# Patient Record
Sex: Female | Born: 1955 | Race: White | Hispanic: No | Marital: Married | Smoking: Never smoker
Health system: Southern US, Community
[De-identification: ages and names within clinical notes are randomized; demographics above are authoritative.]

## PROBLEM LIST (undated history)

## (undated) DIAGNOSIS — E785 Hyperlipidemia, unspecified: Secondary | ICD-10-CM

## (undated) DIAGNOSIS — M542 Cervicalgia: Secondary | ICD-10-CM

## (undated) DIAGNOSIS — Z1371 Encounter for nonprocreative screening for genetic disease carrier status: Secondary | ICD-10-CM

## (undated) DIAGNOSIS — G43909 Migraine, unspecified, not intractable, without status migrainosus: Secondary | ICD-10-CM

## (undated) DIAGNOSIS — R05 Cough: Secondary | ICD-10-CM

## (undated) DIAGNOSIS — E7800 Pure hypercholesterolemia, unspecified: Secondary | ICD-10-CM

## (undated) DIAGNOSIS — I10 Essential (primary) hypertension: Secondary | ICD-10-CM

## (undated) DIAGNOSIS — M549 Dorsalgia, unspecified: Secondary | ICD-10-CM

## (undated) DIAGNOSIS — K219 Gastro-esophageal reflux disease without esophagitis: Secondary | ICD-10-CM

## (undated) DIAGNOSIS — R053 Chronic cough: Secondary | ICD-10-CM

## (undated) HISTORY — DX: Gastro-esophageal reflux disease without esophagitis: K21.9

## (undated) HISTORY — DX: Migraine, unspecified, not intractable, without status migrainosus: G43.909

## (undated) HISTORY — DX: Cervicalgia: M54.2

## (undated) HISTORY — DX: Dorsalgia, unspecified: M54.9

## (undated) HISTORY — DX: Encounter for nonprocreative screening for genetic disease carrier status: Z13.71

## (undated) HISTORY — DX: Chronic cough: R05.3

## (undated) HISTORY — PX: APPENDECTOMY: SHX54

## (undated) HISTORY — DX: Essential (primary) hypertension: I10

## (undated) HISTORY — PX: ABDOMINAL HYSTERECTOMY: SHX81

## (undated) HISTORY — DX: Hyperlipidemia, unspecified: E78.5

## (undated) HISTORY — DX: Cough: R05

## (undated) HISTORY — DX: Pure hypercholesterolemia, unspecified: E78.00

---
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
## 2007-10-24 LAB — HM COLONOSCOPY: HM Colonoscopy: NORMAL

## 2008-08-04 ENCOUNTER — Emergency Department (HOSPITAL_BASED_OUTPATIENT_CLINIC_OR_DEPARTMENT_OTHER): Admission: EM | Admit: 2008-08-04 | Discharge: 2008-08-04 | Payer: Self-pay | Admitting: Emergency Medicine

## 2008-08-13 ENCOUNTER — Ambulatory Visit: Payer: Self-pay | Admitting: Family Medicine

## 2008-08-13 DIAGNOSIS — E785 Hyperlipidemia, unspecified: Secondary | ICD-10-CM

## 2008-08-13 DIAGNOSIS — F411 Generalized anxiety disorder: Secondary | ICD-10-CM | POA: Insufficient documentation

## 2008-08-13 DIAGNOSIS — G43909 Migraine, unspecified, not intractable, without status migrainosus: Secondary | ICD-10-CM | POA: Insufficient documentation

## 2008-08-13 DIAGNOSIS — R5383 Other fatigue: Secondary | ICD-10-CM

## 2008-08-13 DIAGNOSIS — R5381 Other malaise: Secondary | ICD-10-CM

## 2008-08-13 DIAGNOSIS — R030 Elevated blood-pressure reading, without diagnosis of hypertension: Secondary | ICD-10-CM | POA: Insufficient documentation

## 2008-08-13 DIAGNOSIS — M549 Dorsalgia, unspecified: Secondary | ICD-10-CM | POA: Insufficient documentation

## 2008-08-14 ENCOUNTER — Telehealth (INDEPENDENT_AMBULATORY_CARE_PROVIDER_SITE_OTHER): Payer: Self-pay | Admitting: *Deleted

## 2008-08-14 LAB — CONVERTED CEMR LAB
Alkaline Phosphatase: 57 units/L (ref 39–117)
Bilirubin, Direct: 0.1 mg/dL (ref 0.0–0.3)
CO2: 29 meq/L (ref 19–32)
Cholesterol: 281 mg/dL (ref 0–200)
GFR calc Af Amer: 98 mL/min
Glucose, Bld: 94 mg/dL (ref 70–99)
Lymphocytes Relative: 21.1 % (ref 12.0–46.0)
Monocytes Relative: 5.1 % (ref 3.0–12.0)
Neutrophils Relative %: 67.6 % (ref 43.0–77.0)
Platelets: 338 10*3/uL (ref 150–400)
Potassium: 4.8 meq/L (ref 3.5–5.1)
RDW: 12.1 % (ref 11.5–14.6)
Sodium: 142 meq/L (ref 135–145)
Total CHOL/HDL Ratio: 4.7
Total Protein: 6.9 g/dL (ref 6.0–8.3)
Triglycerides: 98 mg/dL (ref 0–149)
VLDL: 20 mg/dL (ref 0–40)

## 2008-09-11 ENCOUNTER — Ambulatory Visit: Payer: Self-pay | Admitting: Family Medicine

## 2008-09-12 ENCOUNTER — Encounter (INDEPENDENT_AMBULATORY_CARE_PROVIDER_SITE_OTHER): Payer: Self-pay | Admitting: *Deleted

## 2008-09-12 LAB — CONVERTED CEMR LAB
ALT: 27 units/L (ref 0–35)
AST: 23 units/L (ref 0–37)
Total Bilirubin: 0.8 mg/dL (ref 0.3–1.2)

## 2008-09-15 ENCOUNTER — Telehealth (INDEPENDENT_AMBULATORY_CARE_PROVIDER_SITE_OTHER): Payer: Self-pay | Admitting: *Deleted

## 2008-09-16 ENCOUNTER — Telehealth (INDEPENDENT_AMBULATORY_CARE_PROVIDER_SITE_OTHER): Payer: Self-pay | Admitting: *Deleted

## 2008-09-23 ENCOUNTER — Encounter: Admission: RE | Admit: 2008-09-23 | Discharge: 2008-09-23 | Payer: Self-pay | Admitting: Family Medicine

## 2008-10-03 ENCOUNTER — Encounter (INDEPENDENT_AMBULATORY_CARE_PROVIDER_SITE_OTHER): Payer: Self-pay | Admitting: *Deleted

## 2008-10-10 ENCOUNTER — Encounter (INDEPENDENT_AMBULATORY_CARE_PROVIDER_SITE_OTHER): Payer: Self-pay | Admitting: *Deleted

## 2009-03-11 ENCOUNTER — Ambulatory Visit: Payer: Self-pay | Admitting: Family Medicine

## 2009-03-11 DIAGNOSIS — H538 Other visual disturbances: Secondary | ICD-10-CM | POA: Insufficient documentation

## 2009-03-12 ENCOUNTER — Ambulatory Visit: Payer: Self-pay | Admitting: Family Medicine

## 2009-03-13 ENCOUNTER — Telehealth (INDEPENDENT_AMBULATORY_CARE_PROVIDER_SITE_OTHER): Payer: Self-pay | Admitting: *Deleted

## 2009-03-13 LAB — CONVERTED CEMR LAB
AST: 33 units/L (ref 0–37)
Albumin: 3.9 g/dL (ref 3.5–5.2)
Alkaline Phosphatase: 58 units/L (ref 39–117)
Cholesterol: 201 mg/dL — ABNORMAL HIGH (ref 0–200)
Total Bilirubin: 0.6 mg/dL (ref 0.3–1.2)
Total CHOL/HDL Ratio: 3

## 2009-03-31 ENCOUNTER — Ambulatory Visit: Payer: Self-pay | Admitting: Family Medicine

## 2009-09-09 ENCOUNTER — Telehealth (INDEPENDENT_AMBULATORY_CARE_PROVIDER_SITE_OTHER): Payer: Self-pay | Admitting: *Deleted

## 2009-10-14 ENCOUNTER — Encounter: Payer: Self-pay | Admitting: Family Medicine

## 2009-10-14 ENCOUNTER — Ambulatory Visit: Payer: Self-pay | Admitting: Family Medicine

## 2009-10-14 ENCOUNTER — Other Ambulatory Visit: Admission: RE | Admit: 2009-10-14 | Discharge: 2009-10-14 | Payer: Self-pay | Admitting: Family Medicine

## 2009-10-14 LAB — CONVERTED CEMR LAB
Bilirubin Urine: NEGATIVE
Blood in Urine, dipstick: NEGATIVE
Ketones, urine, test strip: NEGATIVE
Protein, U semiquant: NEGATIVE
Urobilinogen, UA: NEGATIVE

## 2009-10-15 ENCOUNTER — Encounter (INDEPENDENT_AMBULATORY_CARE_PROVIDER_SITE_OTHER): Payer: Self-pay | Admitting: *Deleted

## 2009-10-15 LAB — CONVERTED CEMR LAB
Alkaline Phosphatase: 58 units/L (ref 39–117)
Basophils Relative: 1.2 % (ref 0.0–3.0)
Bilirubin, Direct: 0.1 mg/dL (ref 0.0–0.3)
Calcium: 9.5 mg/dL (ref 8.4–10.5)
Creatinine, Ser: 0.9 mg/dL (ref 0.4–1.2)
Eosinophils Absolute: 0.3 10*3/uL (ref 0.0–0.7)
Eosinophils Relative: 6.4 % — ABNORMAL HIGH (ref 0.0–5.0)
HDL: 71.8 mg/dL (ref >39.00)
LDL Cholesterol: 102 mg/dL — ABNORMAL HIGH (ref 0–99)
Lymphocytes Relative: 23 % (ref 12.0–46.0)
Neutrophils Relative %: 63.7 % (ref 43.0–77.0)
Platelets: 275 10*3/uL (ref 150.0–400.0)
RBC: 4.55 M/uL (ref 3.87–5.11)
Total Bilirubin: 0.7 mg/dL (ref 0.3–1.2)
Total CHOL/HDL Ratio: 3
Total Protein: 6.8 g/dL (ref 6.0–8.3)
Triglycerides: 103 mg/dL (ref 0.0–149.0)
VLDL: 20.6 mg/dL (ref 0.0–40.0)
WBC: 5.3 10*3/uL (ref 4.5–10.5)

## 2009-10-19 ENCOUNTER — Encounter (INDEPENDENT_AMBULATORY_CARE_PROVIDER_SITE_OTHER): Payer: Self-pay | Admitting: *Deleted

## 2009-10-21 ENCOUNTER — Encounter: Admission: RE | Admit: 2009-10-21 | Discharge: 2009-10-21 | Payer: Self-pay | Admitting: Family Medicine

## 2009-11-23 ENCOUNTER — Ambulatory Visit: Payer: Self-pay | Admitting: Family Medicine

## 2010-04-03 ENCOUNTER — Emergency Department (HOSPITAL_BASED_OUTPATIENT_CLINIC_OR_DEPARTMENT_OTHER): Admission: EM | Admit: 2010-04-03 | Discharge: 2010-04-03 | Payer: Self-pay | Admitting: Emergency Medicine

## 2010-04-03 ENCOUNTER — Ambulatory Visit: Payer: Self-pay | Admitting: Radiology

## 2010-04-03 ENCOUNTER — Encounter: Payer: Self-pay | Admitting: Family Medicine

## 2010-07-22 ENCOUNTER — Encounter: Payer: Self-pay | Admitting: Family Medicine

## 2010-07-22 ENCOUNTER — Ambulatory Visit: Payer: Self-pay | Admitting: Family Medicine

## 2010-07-22 DIAGNOSIS — R51 Headache: Secondary | ICD-10-CM

## 2010-07-22 DIAGNOSIS — I10 Essential (primary) hypertension: Secondary | ICD-10-CM

## 2010-07-22 DIAGNOSIS — R519 Headache, unspecified: Secondary | ICD-10-CM | POA: Insufficient documentation

## 2010-07-22 DIAGNOSIS — R42 Dizziness and giddiness: Secondary | ICD-10-CM

## 2010-07-25 ENCOUNTER — Encounter: Admission: RE | Admit: 2010-07-25 | Discharge: 2010-07-25 | Payer: Self-pay | Admitting: Family Medicine

## 2010-07-27 ENCOUNTER — Telehealth (INDEPENDENT_AMBULATORY_CARE_PROVIDER_SITE_OTHER): Payer: Self-pay | Admitting: *Deleted

## 2010-07-27 LAB — CONVERTED CEMR LAB
ALT: 20 units/L (ref 0–35)
AST: 20 units/L (ref 0–37)
Alkaline Phosphatase: 56 units/L (ref 39–117)
BUN: 16 mg/dL (ref 6–23)
Basophils Absolute: 0.1 10*3/uL (ref 0.0–0.1)
Bilirubin, Direct: 0.1 mg/dL (ref 0.0–0.3)
Calcium: 10.1 mg/dL (ref 8.4–10.5)
Creatinine, Ser: 0.8 mg/dL (ref 0.4–1.2)
Eosinophils Relative: 6.2 % — ABNORMAL HIGH (ref 0.0–5.0)
Folate: 6.9 ng/mL
GFR calc non Af Amer: 78.85 mL/min (ref >60)
Glucose, Bld: 96 mg/dL (ref 70–99)
HCT: 41.7 % (ref 36.0–46.0)
HDL: 65.7 mg/dL (ref >39.00)
Lymphocytes Relative: 22.9 % (ref 12.0–46.0)
Monocytes Relative: 6 % (ref 3.0–12.0)
Neutrophils Relative %: 63.9 % (ref 43.0–77.0)
Platelets: 296 10*3/uL (ref 150.0–400.0)
Potassium: 5.1 meq/L (ref 3.5–5.1)
RDW: 13.1 % (ref 11.5–14.6)
TSH: 1.18 microintl units/mL (ref 0.35–5.50)
Total Bilirubin: 0.5 mg/dL (ref 0.3–1.2)
Triglycerides: 108 mg/dL (ref 0.0–149.0)
VLDL: 21.6 mg/dL (ref 0.0–40.0)
WBC: 5.1 10*3/uL (ref 4.5–10.5)

## 2010-08-02 ENCOUNTER — Ambulatory Visit: Payer: Self-pay | Admitting: Family Medicine

## 2010-08-02 DIAGNOSIS — J019 Acute sinusitis, unspecified: Secondary | ICD-10-CM

## 2010-08-12 ENCOUNTER — Encounter: Payer: Self-pay | Admitting: Family Medicine

## 2010-09-02 ENCOUNTER — Encounter: Payer: Self-pay | Admitting: Family Medicine

## 2010-09-29 ENCOUNTER — Ambulatory Visit: Payer: Self-pay | Admitting: Family Medicine

## 2010-10-04 LAB — CONVERTED CEMR LAB
ALT: 39 units/L — ABNORMAL HIGH (ref 0–35)
AST: 30 units/L (ref 0–37)
BUN: 19 mg/dL (ref 6–23)
Bilirubin, Direct: 0.1 mg/dL (ref 0.0–0.3)
CO2: 23 meq/L (ref 19–32)
Chloride: 104 meq/L (ref 96–112)
Creatinine, Ser: 1.01 mg/dL (ref 0.40–1.20)
Potassium: 4.6 meq/L (ref 3.5–5.3)
Total Protein: 6.1 g/dL (ref 6.0–8.3)

## 2010-10-15 ENCOUNTER — Ambulatory Visit: Payer: Self-pay | Admitting: Family Medicine

## 2010-11-23 ENCOUNTER — Other Ambulatory Visit: Payer: Self-pay | Admitting: Family Medicine

## 2010-11-23 ENCOUNTER — Ambulatory Visit
Admission: RE | Admit: 2010-11-23 | Discharge: 2010-11-23 | Payer: Self-pay | Source: Home / Self Care | Attending: Family Medicine | Admitting: Family Medicine

## 2010-11-24 ENCOUNTER — Encounter: Payer: Self-pay | Admitting: Family Medicine

## 2010-11-24 LAB — CBC WITH DIFFERENTIAL/PLATELET
Basophils Absolute: 0 10*3/uL (ref 0.0–0.1)
Basophils Relative: 0.5 % (ref 0.0–3.0)
Eosinophils Absolute: 0.6 10*3/uL (ref 0.0–0.7)
Eosinophils Relative: 7.5 % — ABNORMAL HIGH (ref 0.0–5.0)
HCT: 37.5 % (ref 36.0–46.0)
Hemoglobin: 12.9 g/dL (ref 12.0–15.0)
Lymphocytes Relative: 22.1 % (ref 12.0–46.0)
Lymphs Abs: 1.7 10*3/uL (ref 0.7–4.0)
MCHC: 34.3 g/dL (ref 30.0–36.0)
MCV: 93 fl (ref 78.0–100.0)
Monocytes Absolute: 0.3 10*3/uL (ref 0.1–1.0)
Monocytes Relative: 3.5 % (ref 3.0–12.0)
Neutro Abs: 5.1 10*3/uL (ref 1.4–7.7)
Neutrophils Relative %: 66.4 % (ref 43.0–77.0)
Platelets: 378 10*3/uL (ref 150.0–400.0)
RBC: 4.04 Mil/uL (ref 3.87–5.11)
RDW: 13 % (ref 11.5–14.6)
WBC: 7.7 10*3/uL (ref 4.5–10.5)

## 2010-11-24 LAB — TSH: TSH: 0.91 u[IU]/mL (ref 0.35–5.50)

## 2010-11-25 ENCOUNTER — Telehealth (INDEPENDENT_AMBULATORY_CARE_PROVIDER_SITE_OTHER): Payer: Self-pay | Admitting: *Deleted

## 2010-12-01 ENCOUNTER — Encounter: Payer: Self-pay | Admitting: Family Medicine

## 2010-12-07 NOTE — Letter (Signed)
Summary: [facility]  [facility]   Imported By: Lanelle Bal 09/09/2010 11:05:43  _____________________________________________________________________  External Attachment:    Type:   Image     Comment:   External Document

## 2010-12-07 NOTE — Progress Notes (Signed)
Summary: REFILL REQUEST  Phone Note Refill Request Call back at [phone] Message from:  Pharmacy on July 27, 2010 8:06 AM  Refills Requested: Medication #1:  CEFTIN 500 MG TABS 1 by mouth two times a day.   Dosage confirmed as above?Dosage Confirmed   Supply Requested: 1 month   Notes: CORRECT DOCTOR [facility] [address]  Next Appointment Scheduled: 08/02/10 Initial call taken by: Lavell Islam,  July 27, 2010 8:07 AM  Follow-up for Phone Call        rx resubmitted with physician name on rx...........Marland KitchenFelecia Deloach CMA  July 27, 2010 12:36 PM     Prescriptions: CEFTIN 500 MG TABS (CEFUROXIME AXETIL) 1 by mouth two times a day  #20 x 0   Entered by:   Jeremy Johann CMA   Authorized by:   Loreen Freud DO   Signed by:   Jeremy Johann CMA on 07/27/2010   Method used:   Re-Faxed to ...       [facility] [phone][facility] (retail)       [address]       [place], [place]  [address]       Ph: [phone]       Fax: [phone]   RxID:   [id number]

## 2010-12-07 NOTE — Assessment & Plan Note (Signed)
Summary: dizziness while laying on left side//lch   Vital Signs:  Patient profile:   55 year old female Height:      63 inches Weight:      167.8 pounds BMI:     29.83 Pulse rate:   68 / minute Pulse rhythm:   regular BP sitting:   130 / 90  (right arm)  Vitals Entered By: Almeta Monas CMA Duncan Dull) (July 22, 2010 8:07 AM) CC: c/o dizziness since getting back and neck cracked at the chiropractor x10 days ago   History of Present Illness: Pt here c/o about severe headache about 6 weeks ago --she went to ER and was given meds for 10 days but then she stopped.  She since c/o about pressure in back of head and neck.  About  10 days ago she started getting dizzy after having her neck adjusted by chiropractor---Dr Kristin Bruins on [address].  Now she feels like she gets dizzy everytime she lays on her left side.    Current Medications (verified): 1)  Sumatriptan Succinate 25 Mg Tabs (Sumatriptan Succinate) .Marland Kitchen.. 1 Tab As Needed For Migraine 2)  Simvastatin 40 Mg Tabs (Simvastatin) .... Take One Tablet Daily 3)  Lisinopril-Hydrochlorothiazide 20-12.5 Mg Tabs (Lisinopril-Hydrochlorothiazide) .Marland Kitchen.. 1 By Mouth Once Daily  Allergies (verified): No Known Drug Allergies  Past History:  Past medical, surgical, family and social histories (including risk factors) reviewed for relevance to current acute and chronic problems.  Past Medical History: Reviewed history from 08/13/2008 and no changes required. Elevated cholesterol Back/neck pain BRCA (-)  Past Surgical History: Reviewed history from 08/13/2008 and no changes required. Hysterectomy- due to hemmorhagic cyst, 12/05, STILL HAS OVARIES Appendectomy 1997 Tonsils/Adenoids 1966  Family History: Reviewed history from 08/13/2008 and no changes required. Extensive family hx of breast CA- mother, aunts, grandmother, greatgrandmother Mother- DM, died at 22 Father- Prostate CA, died at 5 1 sister- healthy  Social History: Reviewed history  from 10/14/2009 and no changes required. 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  Otoscopic examination reveals clear canals, tympanic membranes are intact bilaterally without bulging, retraction, inflammation or discharge. Hearing is grossly normal bilaterally. Nose:  External nasal examination shows no deformity or inflammation. Nasal mucosa are pink and moist without lesions or exudates. Neck:  No deformities, masses, or tenderness noted.no carotid bruits.   Lungs:  Normal respiratory effort, chest expands symmetrically. Lungs are clear to auscultation, no crackles or wheezes. Heart:  Normal rate and regular rhythm. S1 and S2 normal without gallop, murmur, click, rub or other extra sounds. Psych:  Cognition and judgment appear intact. Alert and cooperative with normal attention span and concentration. No apparent delusions, illusions, hallucinations   Impression & Recommendations:  Problem # 1:  HEADACHE (ICD-784.0)  Her updated medication list for this problem includes:    Sumatriptan Succinate 25 Mg Tabs (Sumatriptan succinate) .Marland Kitchen... 1 tab as needed for migraine  Orders: EKG w/ Interpretation (93000) Venipuncture (62694) TLB-B12 + Folate Pnl (85462_70350-K93/GHW) TLB-Lipid Panel (80061-LIPID) TLB-BMP (Basic Metabolic Panel-BMET) (80048-METABOL) TLB-CBC Platelet - w/Differential (85025-CBCD) TLB-Hepatic/Liver Function Pnl (80076-HEPATIC) TLB-TSH (Thyroid Stimulating Hormone) (29937-JIR) Radiology Referral (Radiology) Specimen Handling (67893)  Headache diary reviewed.  Problem # 2:  DIZZINESS (ICD-780.4)  Orders: EKG w/ Interpretation (93000) Venipuncture (  53664) TLB-B12 +  Folate Pnl (82746_82607-B12/FOL) TLB-Lipid Panel (80061-LIPID) TLB-BMP (Basic Metabolic Panel-BMET) (80048-METABOL) TLB-CBC Platelet - w/Differential (85025-CBCD) TLB-Hepatic/Liver Function Pnl (80076-HEPATIC) TLB-TSH (Thyroid Stimulating Hormone) (40347-QQV) Radiology Referral (Radiology) Specimen Handling (95638)  Demonstrated maneuvers to self-treat vertigo. Patient to call to be seen if no improvement in 10-14 days, sooner if worse.   Problem # 3:  UNSPECIFIED ESSENTIAL HYPERTENSION (ICD-401.9)  Her updated medication list for this problem includes:    Lisinopril-hydrochlorothiazide 20-12.5 Mg Tabs (Lisinopril-hydrochlorothiazide) .Marland Kitchen... 1 by mouth once daily  Orders: EKG w/ Interpretation (93000) Venipuncture (75643) TLB-B12 + Folate Pnl (32951_88416-S06/TKZ) TLB-Lipid Panel (80061-LIPID) TLB-BMP (Basic Metabolic Panel-BMET) (80048-METABOL) TLB-CBC Platelet - w/Differential (85025-CBCD) TLB-Hepatic/Liver Function Pnl (80076-HEPATIC) TLB-TSH (Thyroid Stimulating Hormone) (60109-NAT) Radiology Referral (Radiology) Specimen Handling (55732)  BP today: 130/90 Prior BP: 150/93 (10/14/2009)  Labs Reviewed: K+: 4.6 (10/14/2009) Creat: : 0.9 (10/14/2009)   Chol: 194 (10/14/2009)   HDL: 71.80 (10/14/2009)   LDL: 102 (10/14/2009)   TG: 103.0 (10/14/2009)  Complete Medication List: 1)  Sumatriptan Succinate 25 Mg Tabs (Sumatriptan succinate) .Marland Kitchen.. 1 tab as needed for migraine 2)  Simvastatin 40 Mg Tabs (Simvastatin) .... Take one tablet daily 3)  Lisinopril-hydrochlorothiazide 20-12.5 Mg Tabs (Lisinopril-hydrochlorothiazide) .Marland Kitchen.. 1 by mouth once daily  Patient Instructions: 1)  Please schedule a follow-up appointment in 2 weeks.  Prescriptions: SIMVASTATIN 40 MG TABS (SIMVASTATIN) take one tablet daily  #90 x 3   Entered and Authorized by:   Loreen Freud DO   Signed by:   Loreen Freud DO on 07/22/2010   Method used:   [facility] to        [facility] [phone]*  (retail)       [address]       [place], [place]  [address]       Ph: [phone]       Fax: [phone]   RxID:   [id number] LISINOPRIL-HYDROCHLOROTHIAZIDE 20-12.5 MG TABS (LISINOPRIL-HYDROCHLOROTHIAZIDE) 1 by mouth once daily  #30 x 2   Entered and Authorized by:   Loreen Freud DO   Signed by:   Loreen Freud DO on 07/22/2010   Method used:   Electronically to        [facility] [phone]* (retail)       [address]       [place], [place]  [address]       Ph: [phone]       Fax: [phone]   RxID:   [id number]    EKG  Procedure date:  07/22/2010  Findings:      Normal sinus rhythm with rate of:  64 bpm

## 2010-12-07 NOTE — Assessment & Plan Note (Signed)
Summary: rto 10 days/cbs   Vital Signs:  Patient profile:   55 year old female Weight:      166 pounds Temp:     98.8 degrees F oral Pulse rate:   68 / minute Pulse rhythm:   regular BP sitting:   110 / 76  (left arm) Cuff size:   regular  Vitals Entered By: Almeta Monas CMA Duncan Dull) (August 02, 2010 9:27 AM) CC: 2 week f/u still having neck and back pain   History of Present Illness: Pt here f/u bp and dizziness.  Dizziness subsided after being on abx a few days.  Pt still with muscle spasm and headache R trap region.    Current Medications (verified): 1)  Sumatriptan Succinate 25 Mg Tabs (Sumatriptan Succinate) .Marland Kitchen.. 1 Tab As Needed For Migraine 2)  Simvastatin 40 Mg Tabs (Simvastatin) .... Take One Tablet Daily 3)  Lisinopril-Hydrochlorothiazide 20-12.5 Mg Tabs (Lisinopril-Hydrochlorothiazide) .Marland Kitchen.. 1 By Mouth Once Daily 4)  Ceftin 500 Mg Tabs (Cefuroxime Axetil) .Marland Kitchen.. 1 By Mouth Two Times A Day 5)  Flexeril 10 Mg Tabs (Cyclobenzaprine Hcl) .Marland Kitchen.. 1 By Mouth Three Times A Day 6)  Aspir-Low 81 Mg Tbec (Aspirin) .Marland Kitchen.. 1po Once Daily  Allergies (verified): No Known Drug Allergies  Past History:  Past medical, surgical, family and social histories (including risk factors) reviewed for relevance to current acute and chronic problems.  Past Medical History: Reviewed history from 08/13/2008 and no changes required. Elevated cholesterol Back/neck pain BRCA (-)  Past Surgical History: Reviewed history from 08/13/2008 and no changes required. Hysterectomy- due to hemmorhagic cyst, 12/05, STILL HAS OVARIES Appendectomy 1997 Tonsils/Adenoids 1966  Family History: Reviewed history from 08/13/2008 and no changes required. Extensive family hx of breast CA- mother, aunts, grandmother, greatgrandmother Mother- DM, died at 22 Father- Prostate CA, died at 85 1 sister- healthy  Social History: Reviewed history from 10/14/2009 and no changes required. 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 Msk:  + muscle spasms R trap Neurologic:  No cranial nerve deficits noted. Station and gait are normal. Plantar reflexes are down-going bilaterally. DTRs are symmetrical throughout. Sensory, motor and coordinative functions appear intact. Skin:  Intact without suspicious lesions or rashes Cervical Nodes:  No lymphadenopathy noted Psych:  Cognition and judgment appear intact. Alert and cooperative with normal attention span and concentration. No apparent delusions, illusions, hallucinations   Impression & Recommendations:  Problem # 1:  HEADACHE (ICD-784.0) Tension/ muscle spasm----flexeril 10 three times a day as needed  Her updated medication list for this problem includes:    Sumatriptan Succinate 25 Mg Tabs (Sumatriptan succinate) .Marland Kitchen... 1 tab as needed for migraine    Aspir-low 81 Mg Tbec (Aspirin) .Marland Kitchen... 1po once daily  Headache diary reviewed.  Problem # 2:  DIZZINESS (ICD-780.4) Assessment: Improved  Problem # 3:  HYPERLIPIDEMIA (ICD-272.4)  Her updated medication list for this problem includes:    Simvastatin 40 Mg Tabs (Simvastatin) .Marland Kitchen... Take one tablet daily  Labs Reviewed: SGOT: 20 (07/22/2010)   SGPT: 20 (07/22/2010)   HDL:65.70 (07/22/2010), 71.80 (10/14/2009)  LDL:102 (10/14/2009), DEL (08/13/2008)  Chol:229 (07/22/2010), 194 (10/14/2009)  Trig:108.0 (07/22/2010), 103.0 (10/14/2009)  Problem # 4:  SINUSITIS - ACUTE-NOS (ICD-461.9) Assessment: Improved  Her updated medication list for this  problem includes:    Ceftin 500 Mg Tabs (Cefuroxime axetil) .Marland Kitchen... 1 by mouth two times a day  Instructed on treatment. Call if symptoms persist or worsen.   Complete Medication  List: 1)  Sumatriptan Succinate 25 Mg Tabs (Sumatriptan succinate) .Marland Kitchen.. 1 tab as needed for migraine 2)  Simvastatin 40 Mg Tabs (Simvastatin) .... Take one tablet daily 3)  Lisinopril-hydrochlorothiazide 20-12.5 Mg Tabs (Lisinopril-hydrochlorothiazide) .Marland Kitchen.. 1 by mouth once daily 4)  Ceftin 500 Mg Tabs (Cefuroxime axetil) .Marland Kitchen.. 1 by mouth two times a day 5)  Flexeril 10 Mg Tabs (Cyclobenzaprine hcl) .Marland Kitchen.. 1 by mouth three times a day 6)  Aspir-low 81 Mg Tbec (Aspirin) .Marland Kitchen.. 1po once daily  Patient Instructions: 1)  NMR, hep , bmp---272.4 ----2 months with ov 2 weeks after --- make that her cpe  Prescriptions: FLEXERIL 10 MG TABS (CYCLOBENZAPRINE HCL) 1 by mouth three times a day  #30 x 0   Entered and Authorized by:   Loreen Freud DO   Signed by:   Loreen Freud DO on 08/02/2010   Method used:   Electronically to        [facility] [phone]* (retail)       [address]       [place], [place]  [address]       Ph: [phone]       Fax: [phone]   RxID:   [id number]     Appended Document: Orders Update Flu Vaccine Consent Questions     Do you have a history of severe allergic reactions to this vaccine? no    Any prior history of allergic reactions to egg and/or gelatin? no    Do you have a sensitivity to the preservative Thimersol? no    Do you have a past history of Guillan-Barre Syndrome? no    Do you currently have an acute febrile illness? no    Have you ever had a severe reaction to latex? no    Vaccine information given and explained to patient? yes    Are you currently pregnant? no    Lot Number:[id number]   Exp Date:05/07/2011   Site Given  Left Deltoid IM    Clinical Lists Changes  Orders: Added new Service order of Admin 1st Vaccine (83419) - Signed Added new Service order of Flu Vaccine 11yrs + [phone]) - Signed Observations: Added new observation of FLU VAX VIS: 06/01/10 version (08/02/2010 10:00) Added new observation of FLU  VAXLOT: [id number] (08/02/2010 10:00) Added new observation of FLU VAXMFR: Glaxosmithkline (08/02/2010 10:00) Added new observation of FLU VAX EXP: 05/07/2011 (08/02/2010 10:00) Added new observation of FLU VAX DSE: 0.10ml (08/02/2010 10:00) Added new observation of FLU VAX: Fluvax 3+ (08/02/2010 10:00)

## 2010-12-07 NOTE — Consult Note (Signed)
Summary: [facility]  [facility]   Imported By: Lanelle Bal 08/27/2010 08:27:36  _____________________________________________________________________  External Attachment:    Type:   Image     Comment:   External Document

## 2010-12-08 ENCOUNTER — Encounter: Payer: Self-pay | Admitting: Family Medicine

## 2010-12-09 NOTE — Letter (Signed)
Summary: Generic Letter  [facility]  [address] [place], [place] [address]   Phone: [phone]  Fax: [phone]    12/01/2010     [name] [address] [place], [place]  [address]     Dear [name],      I have tried to contact you by phone in reference to our last conversation. Please feel free to give me a call [phone]. I will be available Monday- Friday from 8AM until 5PM.        Sincerely,   Almeta Monas CMA (AAMA)

## 2010-12-09 NOTE — Progress Notes (Addendum)
Summary: Results 1/19,1/20,1/23  Phone Note Outgoing Call   Call placed by: Almeta Monas CMA Duncan Dull),  November 25, 2010 4:45 PM Call placed to: Patient Details for Reason: eosinophils elevated---any allergy symptoms?   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 Wwanted to know if we could call something in for [facility] cough [facility] [address].... c/b # [id number].Marland KitchenMarland Kitchenplease advise  Follow-up for Phone Call        I would have her switch to allegra or zyrtec otc --- if she has dry cough that may help and she can use delsym . If its a productive cough she should use mucinex. Follow-up by: Loreen Freud DO,  November 25, 2010 4:51 PM  Additional Follow-up for Phone Call Additional follow up Details #1::        mssg left AM .... Almeta Monas CMA Duncan Dull)  November 26, 2010 10:19 AM  Left message to call back... Almeta Monas CMA Duncan Dull)  November 29, 2010 10:50 AM     Additional Follow-up for Phone Call Additional follow up Details #2::    unable to reach pt...letter mailed Follow-up by: Almeta Monas CMA Duncan Dull),  December 01, 2010 12:53 PM   Appended Document: Results 1/19,1/20,1/23 Discuss with patient

## 2010-12-09 NOTE — Assessment & Plan Note (Signed)
Summary: cpx/cbs   Vital Signs:  Patient profile:   55 year old female Height:      63.5 inches O2 Sat:      98 % on Room air Temp:     98.5 degrees F oral Pulse rate:   114 / minute Resp:     16 per minute BP sitting:   86 / 56  (left arm)  Vitals Entered By: Jeremy Johann CMA (November 23, 2010 1:07 PM)  O2 Flow:  Room air CC: CPX, not fasting, no pap Comments Pt decline to weight   History of Present Illness: Pt here for cpe--no pap.     Hyperlipidemia follow-up      This is a 55 year old woman who presents for Hyperlipidemia follow-up.  The patient denies muscle aches, GI upset, abdominal pain, flushing, itching, constipation, diarrhea, and fatigue.  The patient denies the following symptoms: chest pain/pressure, exercise intolerance, dypsnea, palpitations, syncope, and pedal edema.  Compliance with medications (by patient report) has been near 100%.  Dietary compliance has been good.  The patient reports exercising 3-4X per week.  Adjunctive measures currently used by the patient include ASA.    Hypertension follow-up      The patient also presents for Hypertension follow-up.  The patient denies lightheadedness, urinary frequency, headaches, edema, impotence, rash, and fatigue.  The patient denies the following associated symptoms: chest pain, chest pressure, exercise intolerance, dyspnea, palpitations, syncope, leg edema, and pedal edema.  Compliance with medications (by patient report) has been near 100%.  The patient reports that dietary compliance has been good.  The patient reports exercising 3-4X per week.  Adjunctive measures currently used by the patient include salt restriction.    Preventive Screening-Counseling & Management  Alcohol-Tobacco     Alcohol drinks/day: <1     Alcohol type: wine     Smoking Status: never  Caffeine-Diet-Exercise     Caffeine use/day: 1     Does Patient Exercise: yes     Type of exercise: walking     Exercise (avg: min/session):  30-60     Times/week: <3  Hep-HIV-STD-Contraception     Dental Visit-last 6 months yes     Dental Care Counseling: not indicated; dental care within six months     SBE monthly: yes     SBE Education/Counseling: not indicated; SBE done regularly      Sexual History:  currently monogamous and married with 3 children.        Drug Use:  never.    Current Medications (verified): 1)  Simvastatin 40 Mg Tabs (Simvastatin) .... Take One Tablet Daily 2)  Lisinopril-Hydrochlorothiazide 10-12.5 Mg Tabs (Lisinopril-Hydrochlorothiazide) .Marland Kitchen.. 1 By Mouth Once Daily 3)  Aspir-Low 81 Mg Tbec (Aspirin) .Marland Kitchen.. 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 Extensive family hx of breast CA- mother, aunts, grandmother, greatgrandmother Mother- DM, died at 51 Father- Prostate CA, died at 57 1 sister- healthy  Social History: Reviewed history from 10/14/2009 and no changes required. 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 Eyes:  Denies blurring, discharge, double vision, eye irritation, eye pain, halos, itching, light sensitivity, red eye, vision loss-1 eye, and vision loss-both eyes; optho--- Z6X. ENT:  Denies decreased hearing, difficulty swallowing, ear discharge, earache, hoarseness, nasal congestion, nosebleeds, postnasal drainage, ringing in ears, sinus pressure, and sore throat. CV:  Denies bluish discoloration of lips or nails, chest pain or discomfort, difficulty breathing at night, difficulty breathing while lying down, fainting, fatigue, leg cramps with exertion, lightheadness, near fainting, palpitations, shortness of breath with exertion, swelling of feet, swelling of hands, and weight gain. Resp:  Denies chest discomfort, chest pain with inspiration, cough, coughing up blood, excessive snoring, hypersomnolence, morning headaches, pleuritic, shortness of breath, sputum productive, and wheezing. GI:  Denies abdominal pain, bloody stools, change in bowel habits, constipation, dark tarry stools, diarrhea, excessive appetite, gas, hemorrhoids, indigestion, loss of appetite, nausea, vomiting, vomiting blood, and yellowish skin color. GU:  Denies abnormal vaginal bleeding, decreased libido, discharge, dysuria, genital sores, hematuria, incontinence, nocturia, urinary frequency, and urinary hesitancy. MS:  Denies joint pain, joint redness, joint swelling, loss of strength, low back pain, mid back pain, muscle aches, muscle , cramps, muscle weakness, stiffness, and thoracic pain. Derm:  Denies changes in color of skin, changes in nail beds, dryness, excessive perspiration, flushing, hair loss, insect bite(s), itching, lesion(s), poor wound healing, and rash. Neuro:  Denies brief paralysis, difficulty with concentration, disturbances in coordination, falling down, headaches, inability to speak, memory loss, numbness, poor  balance, seizures, sensation of room spinning, tingling, tremors, visual disturbances, and weakness. Psych:  Denies alternate hallucination ( auditory/visual), anxiety, depression, easily angered, easily tearful, irritability, mental problems, panic attacks, sense of great danger, suicidal thoughts/plans, thoughts of violence, unusual visions or sounds, and thoughts /plans of harming others. Endo:  Denies cold intolerance, excessive hunger, excessive thirst, excessive urination, heat intolerance, polyuria, and weight change. Heme:  Denies abnormal bruising, bleeding, enlarge lymph nodes, fevers, pallor, and skin discoloration. Allergy:  Denies hives or rash, itching eyes, persistent infections, seasonal allergies, and sneezing.  Physical Exam  General:  Well-developed,well-nourished,in no acute distress; alert,appropriate and cooperative throughout examination Head:  Normocephalic and atraumatic without obvious abnormalities. No apparent alopecia or balding. Eyes:  vision grossly intact, pupils equal, pupils round, pupils reactive to light, and no injection.   Ears:  External ear exam shows no significant lesions or deformities.  Otoscopic examination reveals clear canals, tympanic membranes are intact bilaterally without bulging, retraction, inflammation or discharge. Hearing is grossly normal bilaterally. Nose:  External nasal examination shows no deformity or inflammation. Nasal mucosa are pink and moist without lesions or exudates. Mouth:  Oral mucosa and oropharynx without lesions or exudates.  Teeth in good repair. Neck:  No deformities, masses, or tenderness noted. Chest Wall:  No deformities, masses, or tenderness noted. Breasts:  No mass, nodules, thickening, tenderness, bulging, retraction, inflamation, nipple discharge or skin changes noted.   Lungs:  Normal respiratory effort, chest expands symmetrically. Lungs are clear to auscultation, no crackles or wheezes. Heart:  normal rate and  no murmur.   Abdomen:  Bowel sounds positive,abdomen soft and non-tender without masses, organomegaly or hernias noted. Msk:  normal ROM, no joint tenderness, no joint swelling, no joint warmth, no redness over joints, no joint deformities, no joint instability, and no crepitation.   Pulses:  R and L carotid,radial,femoral,dorsalis pedis and posterior tibial pulses are full and equal bilaterally Extremities:  No clubbing, cyanosis, edema, or deformity noted with normal full range of motion of all joints.   Neurologic:  No cranial nerve deficits noted. Station and gait are normal. Plantar reflexes are down-going bilaterally. DTRs are symmetrical throughout. Sensory, motor and coordinative functions appear intact. Skin:  Intact without suspicious lesions or rashes Cervical Nodes:  No lymphadenopathy noted Axillary Nodes:  No palpable lymphadenopathy Psych:  Cognition and judgment appear intact. Alert and cooperative with normal attention span and concentration. No apparent delusions, illusions, hallucinations   Impression & Recommendations:  Problem # 1:  PREVENTIVE HEALTH CARE (ICD-V70.0) cholesterol, bmp, hep Orders: Venipuncture (29562) TLB-CBC Platelet - w/Differential (85025-CBCD) TLB-TSH (Thyroid Stimulating Hormone) (13086-VHQ) Radiology Referral (Radiology)  Problem # 2:  UNSPECIFIED ESSENTIAL HYPERTENSION (ICD-401.9) Assessment: Improved  Her updated medication list for this problem includes:    Lisinopril-hydrochlorothiazide 10-12.5 Mg Tabs (Lisinopril-hydrochlorothiazide) .Marland Kitchen... 1 by mouth once daily  BP today: 86/56 Prior BP: 110/76 (08/02/2010)  Labs Reviewed: K+: 4.6 (09/29/2010) Creat: : 1.01 (09/29/2010)   Chol: 229 (07/22/2010)   HDL: 65.70 (07/22/2010)   LDL: 102 (10/14/2009)   TG: 108.0 (07/22/2010)  Problem # 3:  HYPERLIPIDEMIA (ICD-272.4)  Her updated medication list for this problem includes:    Simvastatin 40 Mg Tabs (Simvastatin) .Marland Kitchen... Take one tablet  daily  Labs Reviewed: SGOT: 30 (09/29/2010)   SGPT: 39 (09/29/2010)   HDL:65.70 (07/22/2010), 71.80 (10/14/2009)  LDL:102 (10/14/2009), DEL (08/13/2008)  Chol:229 (07/22/2010), 194 (10/14/2009)  Trig:108.0 (07/22/2010), 103.0 (10/14/2009)  Complete Medication List: 1)  Simvastatin 40 Mg Tabs (Simvastatin) .... Take one tablet daily 2)  Lisinopril-hydrochlorothiazide 10-12.5 Mg Tabs (Lisinopril-hydrochlorothiazide) .Marland Kitchen.. 1 by mouth once daily 3)  Aspir-low 81 Mg Tbec (Aspirin) .Marland Kitchen.. 1po once daily 4)  Calcium 600 1500 Mg Tabs (Calcium carbonate) .Marland Kitchen.. 1 by mouth two times a day 5)  Vitamin D 1000 Unit Tabs (Cholecalciferol) .Marland Kitchen.. 1 by mouth once daily  Patient Instructions: 1)  Please schedule a follow-up appointment in 6 months .  Prescriptions: LISINOPRIL-HYDROCHLOROTHIAZIDE 10-12.5 MG TABS (LISINOPRIL-HYDROCHLOROTHIAZIDE) 1 by mouth once daily  #90 x 3   Entered and Authorized by:   Loreen Freud DO   Signed by:   Loreen Freud DO on 11/23/2010   Method used:   Electronically to        [facility] [phone]* (retail)       [address]       [place], [place]  [address]       Ph: [phone]       Fax: [phone]   RxID:   [id number]    Orders Added: 1)  Venipuncture [29518] 2)  TLB-CBC Platelet - w/Differential [85025-CBCD] 3)  TLB-TSH (Thyroid Stimulating Hormone) [84443-TSH] 4)  Radiology Referral [Radiology] 5)  Est. Patient 40-64 years [99396]      Appended Document: cpx/cbs   Appended Document: cpx/cbs  Laboratory Results   Urine Tests   Date/Time Reported: November 23, 2010 1:52 PM   Routine Urinalysis   Color: lt. yellow Appearance: Cloudy Glucose: negative   (Normal Range: Negative) Bilirubin: negative   (Normal Range: Negative)  Ketone: negative   (Normal Range: Negative) Spec. Gravity: <1.005   (Normal Range: 1.003-1.035) Blood: trace-lysed   (Normal Range: Negative) pH: 5.0   (Normal Range: 5.0-8.0) Protein:  negative   (Normal Range: Negative) Urobilinogen: negative   (Normal Range: 0-1) Nitrite: negative   (Normal Range: Negative) Leukocyte Esterace: negative   (Normal Range: Negative)    Comments: Floydene Flock  November 23, 2010 1:52 PM cx sent/recheck 2 weeks

## 2011-08-04 ENCOUNTER — Other Ambulatory Visit: Payer: Self-pay | Admitting: Family Medicine

## 2011-08-11 ENCOUNTER — Encounter: Payer: Self-pay | Admitting: Family Medicine

## 2011-08-11 ENCOUNTER — Ambulatory Visit: Payer: Self-pay | Admitting: Family Medicine

## 2011-08-15 ENCOUNTER — Telehealth: Payer: Self-pay

## 2011-08-15 NOTE — Telephone Encounter (Signed)
She can schedule her own if that is easier for her--- just give her phone number.

## 2011-08-15 NOTE — Telephone Encounter (Signed)
msg from patient and she stated she missed her first mammogram and wanted to get an order and apt for another one .Please advise    KP

## 2011-08-16 ENCOUNTER — Other Ambulatory Visit: Payer: Self-pay | Admitting: Family Medicine

## 2011-08-16 DIAGNOSIS — Z1231 Encounter for screening mammogram for malignant neoplasm of breast: Secondary | ICD-10-CM

## 2011-08-16 NOTE — Telephone Encounter (Signed)
Previous mammogram says BCG and I gave patient the [phone] number to call and schedule    KP

## 2011-09-15 ENCOUNTER — Ambulatory Visit
Admission: RE | Admit: 2011-09-15 | Discharge: 2011-09-15 | Disposition: A | Discharge status: Home / Self Care | Payer: BC Managed Care – PPO | Source: Ambulatory Visit | Attending: Family Medicine | Admitting: Family Medicine

## 2011-09-15 ENCOUNTER — Other Ambulatory Visit: Payer: Self-pay | Admitting: Family Medicine

## 2011-09-15 DIAGNOSIS — Z1231 Encounter for screening mammogram for malignant neoplasm of breast: Secondary | ICD-10-CM

## 2011-12-05 ENCOUNTER — Other Ambulatory Visit: Payer: Self-pay | Admitting: Family Medicine

## 2011-12-05 MED ORDER — SIMVASTATIN 40 MG PO TABS: 40.0000 mg | ORAL_TABLET | Freq: Every day | ORAL | Status: DC

## 2011-12-05 NOTE — Telephone Encounter (Signed)
Rx faxed.    KP 

## 2011-12-05 NOTE — Telephone Encounter (Signed)
Scheduled KP

## 2011-12-14 ENCOUNTER — Encounter: Payer: BC Managed Care – PPO | Admitting: Family Medicine

## 2012-01-26 ENCOUNTER — Other Ambulatory Visit: Payer: Self-pay | Admitting: Family Medicine

## 2012-02-27 ENCOUNTER — Other Ambulatory Visit: Payer: Self-pay | Admitting: Family Medicine

## 2012-02-27 NOTE — Telephone Encounter (Signed)
Apt pending.      KP 

## 2012-03-26 ENCOUNTER — Other Ambulatory Visit: Payer: Self-pay | Admitting: Family Medicine

## 2012-04-23 ENCOUNTER — Encounter: Payer: Self-pay | Admitting: Family Medicine

## 2012-04-23 ENCOUNTER — Ambulatory Visit (INDEPENDENT_AMBULATORY_CARE_PROVIDER_SITE_OTHER): Payer: BC Managed Care – PPO | Admitting: Family Medicine

## 2012-04-23 ENCOUNTER — Other Ambulatory Visit (HOSPITAL_COMMUNITY)
Admission: RE | Admit: 2012-04-23 | Discharge: 2012-04-23 | Disposition: A | Discharge status: Home / Self Care | Payer: BC Managed Care – PPO | Source: Ambulatory Visit | Attending: Family Medicine | Admitting: Family Medicine

## 2012-04-23 VITALS — BP 112/70 | HR 69 | Temp 98.5°F | Ht 62.25 in | Wt 175.6 lb

## 2012-04-23 DIAGNOSIS — I10 Essential (primary) hypertension: Secondary | ICD-10-CM

## 2012-04-23 DIAGNOSIS — E785 Hyperlipidemia, unspecified: Secondary | ICD-10-CM

## 2012-04-23 DIAGNOSIS — Z124 Encounter for screening for malignant neoplasm of cervix: Secondary | ICD-10-CM

## 2012-04-23 DIAGNOSIS — Z01419 Encounter for gynecological examination (general) (routine) without abnormal findings: Secondary | ICD-10-CM | POA: Insufficient documentation

## 2012-04-23 DIAGNOSIS — Z0000 Encounter for general adult medical examination without abnormal findings: Secondary | ICD-10-CM

## 2012-04-23 MED ORDER — LISINOPRIL-HYDROCHLOROTHIAZIDE 10-12.5 MG PO TABS: 1.0000 | ORAL_TABLET | Freq: Every day | ORAL | Status: DC

## 2012-04-23 MED ORDER — SIMVASTATIN 40 MG PO TABS: ORAL_TABLET | ORAL | Status: DC

## 2012-04-23 NOTE — Assessment & Plan Note (Signed)
Stable con't meds 

## 2012-04-23 NOTE — Progress Notes (Signed)
Subjective:     Kristen Underwood is a 56 y.o. female and is here for a comprehensive physical exam. The patient reports no problems.  History   Social History  . Marital Status: Married    Spouse Name: N/A    Number of Children: 3  . Years of Education: N/A   Occupational History  . PSYCHOLOGIST    Social History Main Topics  . Smoking status: Never Smoker   . Smokeless tobacco: Not on file  . Alcohol Use: Yes     RARE,SOCIAL  . Drug Use: No  . Sexually Active: Yes -- Female partner(s)   Other Topics Concern  . Not on file   Social History Narrative   Exercise--not lately   Health Maintenance  Topic Date Due  . Pap Smear  04/29/1976  . Influenza Vaccine  08/07/2012  . Mammogram  09/14/2013  . Tetanus/tdap  10/20/2014  . Colonoscopy  10/23/2017    The following portions of the patient's history were reviewed and updated as appropriate: allergies, current medications, past family history, past medical history, past social history, past surgical history and problem list.  Review of Systems Review of Systems  Constitutional: Negative for activity change, appetite change and fatigue.  HENT: Negative for hearing loss, congestion, tinnitus and ear discharge.  dentist q3m Eyes: Negative for visual disturbance (see optho q1y -- vision corrected to 20/20 with glasses).  Respiratory: Negative for cough, chest tightness and shortness of breath.   Cardiovascular: Negative for chest pain, palpitations and leg swelling.  Gastrointestinal: Negative for abdominal pain, diarrhea, constipation and abdominal distention.  Genitourinary: Negative for urgency, frequency, decreased urine volume and difficulty urinating.  Musculoskeletal: Negative for back pain, arthralgias and gait problem.  Skin: Negative for color change, pallor and rash.  Neurological: Negative for dizziness, light-headedness, numbness and headaches.  Hematological: Negative for adenopathy. Does not bruise/bleed easily.    Psychiatric/Behavioral: Negative for suicidal ideas, confusion, sleep disturbance, self-injury, dysphoric mood, decreased concentration and agitation.       Objective:    BP 112/70  Pulse 69  Temp 98.5 F (36.9 C) (Oral)  Ht 5' 2.25" (1.581 m)  Wt 175 lb 9.6 oz (79.652 kg)  BMI 31.86 kg/m2  SpO2 97% General appearance: alert, cooperative, appears stated age and no distress Head: Normocephalic, without obvious abnormality, atraumatic Eyes: conjunctivae/corneas clear. PERRL, EOM's intact. Fundi benign. Ears: normal TM's and external ear canals both ears Nose: Nares normal. Septum midline. Mucosa normal. No drainage or sinus tenderness. Throat: lips, mucosa, and tongue normal; teeth and gums normal Neck: no adenopathy, no carotid bruit, no JVD, supple, symmetrical, trachea midline and thyroid not enlarged, symmetric, no tenderness/mass/nodules Back: symmetric, no curvature. ROM normal. No CVA tenderness. Lungs: clear to auscultation bilaterally Breasts: normal appearance, no masses or tenderness Heart: regular rate and rhythm, S1, S2 normal, no murmur, click, rub or gallop Abdomen: soft, non-tender; bowel sounds normal; no masses,  no organomegaly Pelvic: external genitalia normal, no adnexal masses or tenderness, uterus surgically absent and vagina normal without discharge Extremities: extremities normal, atraumatic, no cyanosis or edema Pulses: 2+ and symmetric Skin: Skin color, texture, turgor normal. No rashes or lesions Lymph nodes: Cervical, supraclavicular, and axillary nodes normal. Neurologic: Alert and oriented X 3, normal strength and tone. Normal symmetric reflexes. Normal coordination and gait psych-- no anxiety, no depression    Assessment:    Healthy female exam.      Plan:  Check labs Ghm utd   See After Visit Summary for  Counseling Recommendations

## 2012-04-23 NOTE — Patient Instructions (Addendum)
Preventive Care for Adults, Female A healthy lifestyle and preventive care can promote health and wellness. Preventive health guidelines for women include the following key practices.  A routine yearly physical is a good way to check with your caregiver about your health and preventive screening. It is a chance to share any concerns and updates on your health, and to receive a thorough exam.   Visit your dentist for a routine exam and preventive care every 6 months. Brush your teeth twice a day and floss once a day. Good oral hygiene prevents tooth decay and gum disease.   The frequency of eye exams is based on your age, health, family medical history, use of contact lenses, and other factors. Follow your caregiver's recommendations for frequency of eye exams.   Eat a healthy diet. Foods like vegetables, fruits, whole grains, low-fat dairy products, and lean protein foods contain the nutrients you need without too many calories. Decrease your intake of foods high in solid fats, added sugars, and salt. Eat the right amount of calories for you.Get information about a proper diet from your caregiver, if necessary.   Regular physical exercise is one of the most important things you can do for your health. Most adults should get at least 150 minutes of moderate-intensity exercise (any activity that increases your heart rate and causes you to sweat) each week. In addition, most adults need muscle-strengthening exercises on 2 or more days a week.   Maintain a healthy weight. The body mass index (BMI) is a screening tool to identify possible weight problems. It provides an estimate of body fat based on height and weight. Your caregiver can help determine your BMI, and can help you achieve or maintain a healthy weight.For adults 20 years and older:   A BMI below 18.5 is considered underweight.   A BMI of 18.5 to 24.9 is normal.   A BMI of 25 to 29.9 is considered overweight.   A BMI of 30 and above is  considered obese.   Maintain normal blood lipids and cholesterol levels by exercising and minimizing your intake of saturated fat. Eat a balanced diet with plenty of fruit and vegetables. Blood tests for lipids and cholesterol should begin at age 20 and be repeated every 5 years. If your lipid or cholesterol levels are high, you are over 50, or you are at high risk for heart disease, you may need your cholesterol levels checked more frequently.Ongoing high lipid and cholesterol levels should be treated with medicines if diet and exercise are not effective.   If you smoke, find out from your caregiver how to quit. If you do not use tobacco, do not start.   If you are pregnant, do not drink alcohol. If you are breastfeeding, be very cautious about drinking alcohol. If you are not pregnant and choose to drink alcohol, do not exceed 1 drink per day. One drink is considered to be 12 ounces (355 mL) of beer, 5 ounces (148 mL) of wine, or 1.5 ounces (44 mL) of liquor.   Avoid use of street drugs. Do not share needles with anyone. Ask for help if you need support or instructions about stopping the use of drugs.   High blood pressure causes heart disease and increases the risk of stroke. Your blood pressure should be checked at least every 1 to 2 years. Ongoing high blood pressure should be treated with medicines if weight loss and exercise are not effective.   If you are 55 to 54   years old, ask your caregiver if you should take aspirin to prevent strokes.   Diabetes screening involves taking a blood sample to check your fasting blood sugar level. This should be done once every 3 years, after age 45, if you are within normal weight and without risk factors for diabetes. Testing should be considered at a younger age or be carried out more frequently if you are overweight and have at least 1 risk factor for diabetes.   Breast cancer screening is essential preventive care for women. You should practice "breast  self-awareness." This means understanding the normal appearance and feel of your breasts and may include breast self-examination. Any changes detected, no matter how small, should be reported to a caregiver. Women in their 20s and 30s should have a clinical breast exam (CBE) by a caregiver as part of a regular health exam every 1 to 3 years. After age 40, women should have a CBE every year. Starting at age 40, women should consider having a mammography (breast X-ray test) every year. Women who have a family history of breast cancer should talk to their caregiver about genetic screening. Women at a high risk of breast cancer should talk to their caregivers about having magnetic resonance imaging (MRI) and a mammography every year.   The Pap test is a screening test for cervical cancer. A Pap test can show cell changes on the cervix that might become cervical cancer if left untreated. A Pap test is a procedure in which cells are obtained and examined from the lower end of the uterus (cervix).   Women should have a Pap test starting at age 21.   Between ages 21 and 29, Pap tests should be repeated every 2 years.   Beginning at age 30, you should have a Pap test every 3 years as long as the past 3 Pap tests have been normal.   Some women have medical problems that increase the chance of getting cervical cancer. Talk to your caregiver about these problems. It is especially important to talk to your caregiver if a new problem develops soon after your last Pap test. In these cases, your caregiver may recommend more frequent screening and Pap tests.   The above recommendations are the same for women who have or have not gotten the vaccine for human papillomavirus (HPV).   If you had a hysterectomy for a problem that was not cancer or a condition that could lead to cancer, then you no longer need Pap tests. Even if you no longer need a Pap test, a regular exam is a good idea to make sure no other problems are  starting.   If you are between ages 65 and 70, and you have had normal Pap tests going back 10 years, you no longer need Pap tests. Even if you no longer need a Pap test, a regular exam is a good idea to make sure no other problems are starting.   If you have had past treatment for cervical cancer or a condition that could lead to cancer, you need Pap tests and screening for cancer for at least 20 years after your treatment.   If Pap tests have been discontinued, risk factors (such as a new sexual partner) need to be reassessed to determine if screening should be resumed.   The HPV test is an additional test that may be used for cervical cancer screening. The HPV test looks for the virus that can cause the cell changes on the cervix.   The cells collected during the Pap test can be tested for HPV. The HPV test could be used to screen women aged 30 years and older, and should be used in women of any age who have unclear Pap test results. After the age of 30, women should have HPV testing at the same frequency as a Pap test.   Colorectal cancer can be detected and often prevented. Most routine colorectal cancer screening begins at the age of 50 and continues through age 75. However, your caregiver may recommend screening at an earlier age if you have risk factors for colon cancer. On a yearly basis, your caregiver may provide home test kits to check for hidden blood in the stool. Use of a small camera at the end of a tube, to directly examine the colon (sigmoidoscopy or colonoscopy), can detect the earliest forms of colorectal cancer. Talk to your caregiver about this at age 50, when routine screening begins. Direct examination of the colon should be repeated every 5 to 10 years through age 75, unless early forms of pre-cancerous polyps or small growths are found.   Hepatitis C blood testing is recommended for all people born from 1945 through 1965 and any individual with known risks for hepatitis C.    Practice safe sex. Use condoms and avoid high-risk sexual practices to reduce the spread of sexually transmitted infections (STIs). STIs include gonorrhea, chlamydia, syphilis, trichomonas, herpes, HPV, and human immunodeficiency virus (HIV). Herpes, HIV, and HPV are viral illnesses that have no cure. They can result in disability, cancer, and death. Sexually active women aged 25 and younger should be checked for chlamydia. Older women with new or multiple partners should also be tested for chlamydia. Testing for other STIs is recommended if you are sexually active and at increased risk.   Osteoporosis is a disease in which the bones lose minerals and strength with aging. This can result in serious bone fractures. The risk of osteoporosis can be identified using a bone density scan. Women ages 65 and over and women at risk for fractures or osteoporosis should discuss screening with their caregivers. Ask your caregiver whether you should take a calcium supplement or vitamin D to reduce the rate of osteoporosis.   Menopause can be associated with physical symptoms and risks. Hormone replacement therapy is available to decrease symptoms and risks. You should talk to your caregiver about whether hormone replacement therapy is right for you.   Use sunscreen with sun protection factor (SPF) of 30 or more. Apply sunscreen liberally and repeatedly throughout the day. You should seek shade when your shadow is shorter than you. Protect yourself by wearing long sleeves, pants, a wide-brimmed hat, and sunglasses year round, whenever you are outdoors.   Once a month, do a whole body skin exam, using a mirror to look at the skin on your back. Notify your caregiver of new moles, moles that have irregular borders, moles that are larger than a pencil eraser, or moles that have changed in shape or color.   Stay current with required immunizations.   Influenza. You need a dose every fall (or winter). The composition of  the flu vaccine changes each year, so being vaccinated once is not enough.   Pneumococcal polysaccharide. You need 1 to 2 doses if you smoke cigarettes or if you have certain chronic medical conditions. You need 1 dose at age 65 (or older) if you have never been vaccinated.   Tetanus, diphtheria, pertussis (Tdap, Td). Get 1 dose of   Tdap vaccine if you are younger than age 65, are over 65 and have contact with an infant, are a healthcare worker, are pregnant, or simply want to be protected from whooping cough. After that, you need a Td booster dose every 10 years. Consult your caregiver if you have not had at least 3 tetanus and diphtheria-containing shots sometime in your life or have a deep or dirty wound.   HPV. You need this vaccine if you are a woman age 26 or younger. The vaccine is given in 3 doses over 6 months.   Measles, mumps, rubella (MMR). You need at least 1 dose of MMR if you were born in 1957 or later. You may also need a second dose.   Meningococcal. If you are age 19 to 21 and a first-year college student living in a residence hall, or have one of several medical conditions, you need to get vaccinated against meningococcal disease. You may also need additional booster doses.   Zoster (shingles). If you are age 60 or older, you should get this vaccine.   Varicella (chickenpox). If you have never had chickenpox or you were vaccinated but received only 1 dose, talk to your caregiver to find out if you need this vaccine.   Hepatitis A. You need this vaccine if you have a specific risk factor for hepatitis A virus infection or you simply wish to be protected from this disease. The vaccine is usually given as 2 doses, 6 to 18 months apart.   Hepatitis B. You need this vaccine if you have a specific risk factor for hepatitis B virus infection or you simply wish to be protected from this disease. The vaccine is given in 3 doses, usually over 6 months.  Preventive Services /  Frequency Ages 19 to 39  Blood pressure check.** / Every 1 to 2 years.   Lipid and cholesterol check.** / Every 5 years beginning at age 20.   Clinical breast exam.** / Every 3 years for women in their 20s and 30s.   Pap test.** / Every 2 years from ages 21 through 29. Every 3 years starting at age 30 through age 65 or 70 with a history of 3 consecutive normal Pap tests.   HPV screening.** / Every 3 years from ages 30 through ages 65 to 70 with a history of 3 consecutive normal Pap tests.   Hepatitis C blood test.** / For any individual with known risks for hepatitis C.   Skin self-exam. / Monthly.   Influenza immunization.** / Every year.   Pneumococcal polysaccharide immunization.** / 1 to 2 doses if you smoke cigarettes or if you have certain chronic medical conditions.   Tetanus, diphtheria, pertussis (Tdap, Td) immunization. / A one-time dose of Tdap vaccine. After that, you need a Td booster dose every 10 years.   HPV immunization. / 3 doses over 6 months, if you are 26 and younger.   Measles, mumps, rubella (MMR) immunization. / You need at least 1 dose of MMR if you were born in 1957 or later. You may also need a second dose.   Meningococcal immunization. / 1 dose if you are age 19 to 21 and a first-year college student living in a residence hall, or have one of several medical conditions, you need to get vaccinated against meningococcal disease. You may also need additional booster doses.   Varicella immunization.** / Consult your caregiver.   Hepatitis A immunization.** / Consult your caregiver. 2 doses, 6 to 18 months   apart.   Hepatitis B immunization.** / Consult your caregiver. 3 doses usually over 6 months.  Ages 40 to 64  Blood pressure check.** / Every 1 to 2 years.   Lipid and cholesterol check.** / Every 5 years beginning at age 20.   Clinical breast exam.** / Every year after age 40.   Mammogram.** / Every year beginning at age 40 and continuing for as  long as you are in good health. Consult with your caregiver.   Pap test.** / Every 3 years starting at age 30 through age 65 or 70 with a history of 3 consecutive normal Pap tests.   HPV screening.** / Every 3 years from ages 30 through ages 65 to 70 with a history of 3 consecutive normal Pap tests.   Fecal occult blood test (FOBT) of stool. / Every year beginning at age 50 and continuing until age 75. You may not need to do this test if you get a colonoscopy every 10 years.   Flexible sigmoidoscopy or colonoscopy.** / Every 5 years for a flexible sigmoidoscopy or every 10 years for a colonoscopy beginning at age 50 and continuing until age 75.   Hepatitis C blood test.** / For all people born from 1945 through 1965 and any individual with known risks for hepatitis C.   Skin self-exam. / Monthly.   Influenza immunization.** / Every year.   Pneumococcal polysaccharide immunization.** / 1 to 2 doses if you smoke cigarettes or if you have certain chronic medical conditions.   Tetanus, diphtheria, pertussis (Tdap, Td) immunization.** / A one-time dose of Tdap vaccine. After that, you need a Td booster dose every 10 years.   Measles, mumps, rubella (MMR) immunization. / You need at least 1 dose of MMR if you were born in 1957 or later. You may also need a second dose.   Varicella immunization.** / Consult your caregiver.   Meningococcal immunization.** / Consult your caregiver.   Hepatitis A immunization.** / Consult your caregiver. 2 doses, 6 to 18 months apart.   Hepatitis B immunization.** / Consult your caregiver. 3 doses, usually over 6 months.  Ages 65 and over  Blood pressure check.** / Every 1 to 2 years.   Lipid and cholesterol check.** / Every 5 years beginning at age 20.   Clinical breast exam.** / Every year after age 40.   Mammogram.** / Every year beginning at age 40 and continuing for as long as you are in good health. Consult with your caregiver.   Pap test.** /  Every 3 years starting at age 30 through age 65 or 70 with a 3 consecutive normal Pap tests. Testing can be stopped between 65 and 70 with 3 consecutive normal Pap tests and no abnormal Pap or HPV tests in the past 10 years.   HPV screening.** / Every 3 years from ages 30 through ages 65 or 70 with a history of 3 consecutive normal Pap tests. Testing can be stopped between 65 and 70 with 3 consecutive normal Pap tests and no abnormal Pap or HPV tests in the past 10 years.   Fecal occult blood test (FOBT) of stool. / Every year beginning at age 50 and continuing until age 75. You may not need to do this test if you get a colonoscopy every 10 years.   Flexible sigmoidoscopy or colonoscopy.** / Every 5 years for a flexible sigmoidoscopy or every 10 years for a colonoscopy beginning at age 50 and continuing until age 75.   Hepatitis   C blood test.** / For all people born from 1945 through 1965 and any individual with known risks for hepatitis C.   Osteoporosis screening.** / A one-time screening for women ages 65 and over and women at risk for fractures or osteoporosis.   Skin self-exam. / Monthly.   Influenza immunization.** / Every year.   Pneumococcal polysaccharide immunization.** / 1 dose at age 65 (or older) if you have never been vaccinated.   Tetanus, diphtheria, pertussis (Tdap, Td) immunization. / A one-time dose of Tdap vaccine if you are over 65 and have contact with an infant, are a healthcare worker, or simply want to be protected from whooping cough. After that, you need a Td booster dose every 10 years.   Varicella immunization.** / Consult your caregiver.   Meningococcal immunization.** / Consult your caregiver.   Hepatitis A immunization.** / Consult your caregiver. 2 doses, 6 to 18 months apart.   Hepatitis B immunization.** / Check with your caregiver. 3 doses, usually over 6 months.  ** Family history and personal history of risk and conditions may change your caregiver's  recommendations. Document Released: 12/20/2001 Document Revised: 10/13/2011 Document Reviewed: 03/21/2011 [employer] Patient Information 2012 [employer]. 

## 2012-04-23 NOTE — Assessment & Plan Note (Signed)
Check labs con't meds 

## 2012-04-24 ENCOUNTER — Other Ambulatory Visit (INDEPENDENT_AMBULATORY_CARE_PROVIDER_SITE_OTHER): Payer: BC Managed Care – PPO

## 2012-04-24 DIAGNOSIS — Z0000 Encounter for general adult medical examination without abnormal findings: Secondary | ICD-10-CM

## 2012-04-24 LAB — CBC WITH DIFFERENTIAL/PLATELET
Basophils Absolute: 0.1 10*3/uL (ref 0.0–0.1)
Eosinophils Absolute: 0.3 10*3/uL (ref 0.0–0.7)
HCT: 38.3 % (ref 36.0–46.0)
Hemoglobin: 12.9 g/dL (ref 12.0–15.0)
Lymphs Abs: 1.3 10*3/uL (ref 0.7–4.0)
MCHC: 33.7 g/dL (ref 30.0–36.0)
MCV: 90.3 fl (ref 78.0–100.0)
Monocytes Absolute: 0.3 10*3/uL (ref 0.1–1.0)
Neutro Abs: 3.6 10*3/uL (ref 1.4–7.7)
Platelets: 307 10*3/uL (ref 150.0–400.0)
RDW: 13 % (ref 11.5–14.6)

## 2012-04-24 LAB — URINALYSIS
Bilirubin Urine: NEGATIVE
Ketones, ur: NEGATIVE
Leukocytes, UA: NEGATIVE
Urobilinogen, UA: 0.2 (ref 0.0–1.0)
pH: 6 (ref 5.0–8.0)

## 2012-04-24 LAB — LIPID PANEL
HDL: 90.7 mg/dL (ref >39.00)
Total CHOL/HDL Ratio: 2
VLDL: 17.4 mg/dL (ref 0.0–40.0)

## 2012-04-24 LAB — BASIC METABOLIC PANEL
BUN: 25 mg/dL — ABNORMAL HIGH (ref 6–23)
CO2: 25 mEq/L (ref 19–32)
Glucose, Bld: 91 mg/dL (ref 70–99)
Potassium: 4.2 mEq/L (ref 3.5–5.1)
Sodium: 139 mEq/L (ref 135–145)

## 2012-04-24 LAB — TSH: TSH: 1.34 u[IU]/mL (ref 0.35–5.50)

## 2012-04-24 LAB — HEPATIC FUNCTION PANEL
AST: 23 U/L (ref 0–37)
Bilirubin, Direct: 0 mg/dL (ref 0.0–0.3)
Total Bilirubin: 0.3 mg/dL (ref 0.3–1.2)

## 2012-04-24 NOTE — Progress Notes (Signed)
Labs only

## 2013-04-24 ENCOUNTER — Telehealth: Payer: Self-pay | Admitting: *Deleted

## 2013-04-24 DIAGNOSIS — I10 Essential (primary) hypertension: Secondary | ICD-10-CM

## 2013-04-24 MED ORDER — LISINOPRIL-HYDROCHLOROTHIAZIDE 10-12.5 MG PO TABS: 1.0000 | ORAL_TABLET | Freq: Every day | ORAL | Status: DC

## 2013-04-24 NOTE — Telephone Encounter (Signed)
Refill request for lisinopril-HCTZ 10-12.5mg . Pt has a future appt 8.7.14. Refill done.

## 2013-05-22 ENCOUNTER — Other Ambulatory Visit: Payer: Self-pay | Admitting: Family Medicine

## 2013-06-13 ENCOUNTER — Other Ambulatory Visit: Payer: Self-pay

## 2013-06-13 ENCOUNTER — Encounter: Payer: BC Managed Care – PPO | Admitting: Family Medicine

## 2013-06-13 DIAGNOSIS — Z1231 Encounter for screening mammogram for malignant neoplasm of breast: Secondary | ICD-10-CM

## 2013-06-22 ENCOUNTER — Other Ambulatory Visit: Payer: Self-pay | Admitting: Family Medicine

## 2013-07-22 ENCOUNTER — Ambulatory Visit
Admission: RE | Admit: 2013-07-22 | Discharge: 2013-07-22 | Disposition: A | Discharge status: Home / Self Care | Payer: BC Managed Care – PPO | Source: Ambulatory Visit

## 2013-07-22 DIAGNOSIS — Z1231 Encounter for screening mammogram for malignant neoplasm of breast: Secondary | ICD-10-CM

## 2013-08-06 ENCOUNTER — Telehealth: Payer: Self-pay

## 2013-08-06 NOTE — Telephone Encounter (Signed)
[facility] to reach due to invalid numbers  HM UTD WE flu vaccine

## 2013-08-07 ENCOUNTER — Telehealth: Payer: Self-pay | Admitting: Family Medicine

## 2013-08-07 ENCOUNTER — Encounter: Payer: BC Managed Care – PPO | Admitting: Family Medicine

## 2013-08-07 MED ORDER — SIMVASTATIN 40 MG PO TABS: ORAL_TABLET | ORAL | Status: DC

## 2013-08-07 MED ORDER — LISINOPRIL-HYDROCHLOROTHIAZIDE 10-12.5 MG PO TABS: ORAL_TABLET | ORAL | Status: DC

## 2013-08-07 NOTE — Telephone Encounter (Signed)
08/07/2013   Pt rescheduled CPE for 10/04/2013, but needs medication refills on lisinopril-hydrochlorothiazide (PRINZIDE,ZESTORETIC) 10-12.5 MG per tablet and simvastatin (ZOCOR) 40 MG tablet for the month of November.  Please send to [facility].  Thank you.  bw

## 2013-09-02 ENCOUNTER — Other Ambulatory Visit: Payer: Self-pay | Admitting: Family Medicine

## 2013-10-02 ENCOUNTER — Telehealth: Payer: Self-pay

## 2013-10-02 NOTE — Telephone Encounter (Addendum)
Medication and allergies: reviewed and updated  90 day supply/mail order: na Local pharmacy: [facility]   Immunizations due:  Admin flu vaccine upon arrival  A/P:   No changes to FH or PSH Tdap--10/2004 Pap--04/2012--neg MMG--07/22/2013--nml CCS--10/2007?--no report  To Discuss with Provider: Hand tremors  Chronic Dry cough

## 2013-10-04 ENCOUNTER — Other Ambulatory Visit (HOSPITAL_COMMUNITY)
Admission: RE | Admit: 2013-10-04 | Discharge: 2013-10-04 | Disposition: A | Discharge status: Home / Self Care | Payer: BC Managed Care – PPO | Source: Ambulatory Visit | Attending: Family Medicine | Admitting: Family Medicine

## 2013-10-04 ENCOUNTER — Encounter: Payer: Self-pay | Admitting: Family Medicine

## 2013-10-04 ENCOUNTER — Ambulatory Visit (INDEPENDENT_AMBULATORY_CARE_PROVIDER_SITE_OTHER): Payer: BC Managed Care – PPO | Admitting: Family Medicine

## 2013-10-04 VITALS — BP 104/70 | HR 79 | Temp 98.5°F | Ht 62.25 in | Wt 178.4 lb

## 2013-10-04 DIAGNOSIS — G252 Other specified forms of tremor: Secondary | ICD-10-CM

## 2013-10-04 DIAGNOSIS — R05 Cough: Secondary | ICD-10-CM

## 2013-10-04 DIAGNOSIS — I10 Essential (primary) hypertension: Secondary | ICD-10-CM

## 2013-10-04 DIAGNOSIS — Z01419 Encounter for gynecological examination (general) (routine) without abnormal findings: Secondary | ICD-10-CM | POA: Insufficient documentation

## 2013-10-04 DIAGNOSIS — Z0000 Encounter for general adult medical examination without abnormal findings: Secondary | ICD-10-CM

## 2013-10-04 DIAGNOSIS — Z124 Encounter for screening for malignant neoplasm of cervix: Secondary | ICD-10-CM

## 2013-10-04 DIAGNOSIS — Z1151 Encounter for screening for human papillomavirus (HPV): Secondary | ICD-10-CM | POA: Insufficient documentation

## 2013-10-04 DIAGNOSIS — Z23 Encounter for immunization: Secondary | ICD-10-CM

## 2013-10-04 DIAGNOSIS — E785 Hyperlipidemia, unspecified: Secondary | ICD-10-CM

## 2013-10-04 DIAGNOSIS — G250 Essential tremor: Secondary | ICD-10-CM

## 2013-10-04 LAB — CBC WITH DIFFERENTIAL/PLATELET
Basophils Absolute: 0 10*3/uL (ref 0.0–0.1)
Basophils Relative: 0.7 % (ref 0.0–3.0)
Eosinophils Absolute: 0.3 10*3/uL (ref 0.0–0.7)
Eosinophils Relative: 6.3 % — ABNORMAL HIGH (ref 0.0–5.0)
HCT: 38.2 % (ref 36.0–46.0)
Lymphocytes Relative: 25.5 % (ref 12.0–46.0)
Lymphs Abs: 1.4 10*3/uL (ref 0.7–4.0)
MCHC: 34.6 g/dL (ref 30.0–36.0)
MCV: 86.7 fl (ref 78.0–100.0)
Monocytes Absolute: 0.3 10*3/uL (ref 0.1–1.0)
Platelets: 349 10*3/uL (ref 150.0–400.0)
RBC: 4.4 Mil/uL (ref 3.87–5.11)
RDW: 13.4 % (ref 11.5–14.6)
WBC: 5.4 10*3/uL (ref 4.5–10.5)

## 2013-10-04 LAB — BASIC METABOLIC PANEL
Chloride: 103 mEq/L (ref 96–112)
GFR: 79.03 mL/min (ref >60.00)
Glucose, Bld: 94 mg/dL (ref 70–99)
Potassium: 4.1 mEq/L (ref 3.5–5.1)
Sodium: 136 mEq/L (ref 135–145)

## 2013-10-04 LAB — HEPATIC FUNCTION PANEL
ALT: 42 U/L — ABNORMAL HIGH (ref 0–35)
Bilirubin, Direct: 0.1 mg/dL (ref 0.0–0.3)
Total Protein: 7.1 g/dL (ref 6.0–8.3)

## 2013-10-04 LAB — LIPID PANEL
Cholesterol: 188 mg/dL (ref 0–200)
Triglycerides: 97 mg/dL (ref 0.0–149.0)

## 2013-10-04 MED ORDER — CLONAZEPAM 0.5 MG PO TABS: 0.5000 mg | ORAL_TABLET | Freq: Two times a day (BID) | ORAL | Status: DC | PRN

## 2013-10-04 MED ORDER — LISINOPRIL-HYDROCHLOROTHIAZIDE 10-12.5 MG PO TABS: ORAL_TABLET | ORAL | Status: DC

## 2013-10-04 MED ORDER — FLUTICASONE PROPIONATE 50 MCG/ACT NA SUSP: NASAL | Status: DC

## 2013-10-04 NOTE — Assessment & Plan Note (Signed)
Check labs con't meds 

## 2013-10-04 NOTE — Assessment & Plan Note (Signed)
Stable con't meds 

## 2013-10-04 NOTE — Assessment & Plan Note (Signed)
Refer to allergist Pt does not want to change bp med

## 2013-10-04 NOTE — Progress Notes (Signed)
Pre visit review using our clinic review tool, if applicable. No additional management support is needed unless otherwise documented below in the visit note. 

## 2013-10-04 NOTE — Progress Notes (Signed)
Subjective:     Kristen Underwood is a 57 y.o. female and is here for a comprehensive physical exam. The patient reports problems - tremor R hand with trying to do things-- she says it occurs when she is trying to put make up on or uses utencils while eating. She also c/o dry cough she has had for years.  claritin worked for a while but has stopped  History   Social History  . Marital Status: Married    Spouse Name: N/A    Number of Children: 3  . Years of Education: N/A   Occupational History  . PSYCHOLOGIST    Social History Main Topics  . Smoking status: Never Smoker   . Smokeless tobacco: Not on file  . Alcohol Use: Yes     Comment: RARE,SOCIAL  . Drug Use: No  . Sexual Activity: Yes    Partners: Male   Other Topics Concern  . Not on file   Social History Narrative   Exercise--not lately   Health Maintenance  Topic Date Due  . Influenza Vaccine  06/07/2014  . Tetanus/tdap  10/20/2014  . Pap Smear  04/24/2015  . Mammogram  07/23/2015  . Colonoscopy  10/23/2017    The following portions of the patient's history were reviewed and updated as appropriate:  She  has a past medical history of Elevated cholesterol; Back pain; Neck pain; BRCA negative; and Hypertension. She  does not have any pertinent problems on file. She  has past surgical history that includes Abdominal hysterectomy; Appendectomy; and Tonsillectomy and adenoidectomy (1966). Her family history includes Cancer in her father and mother; Diabetes in her mother; Mental illness in her sister. She  reports that she has never smoked. She does not have any smokeless tobacco history on file. She reports that she drinks alcohol. She reports that she does not use illicit drugs. She has a current medication list which includes the following prescription(s): cholecalciferol, lisinopril-hydrochlorothiazide, and simvastatin. Current Outpatient Prescriptions on File Prior to Visit  Medication Sig Dispense Refill  .  cholecalciferol (VITAMIN D) 1000 UNITS tablet Take 1,000 Units by mouth daily.        Marland Kitchen lisinopril-hydrochlorothiazide (PRINZIDE,ZESTORETIC) 10-12.5 MG per tablet TAKE 1 TABLET BY MOUTH DAILY.  30 tablet  1  . simvastatin (ZOCOR) 40 MG tablet TAKE 1 TABLET AT BEDTIME  30 tablet  0   No current facility-administered medications on file prior to visit.   She has No Known Allergies..  Review of Systems Review of Systems  Constitutional: Negative for activity change, appetite change and fatigue.  HENT: Negative for hearing loss, congestion, tinnitus and ear discharge.  dentist q29m Eyes: Negative for visual disturbance (see optho q1y -- vision corrected to 20/20 with glasses).  Respiratory: Negative for cough, chest tightness and shortness of breath.   Cardiovascular: Negative for chest pain, palpitations and leg swelling.  Gastrointestinal: Negative for abdominal pain, diarrhea, constipation and abdominal distention.  Genitourinary: Negative for urgency, frequency, decreased urine volume and difficulty urinating.  Musculoskeletal: Negative for back pain, arthralgias and gait problem.  Skin: Negative for color change, pallor and rash.  Neurological: Negative for dizziness, light-headedness, numbness and headaches.  Hematological: Negative for adenopathy. Does not bruise/bleed easily.  Psychiatric/Behavioral: Negative for suicidal ideas, confusion, sleep disturbance, self-injury, dysphoric mood, decreased concentration and agitation.       Objective:    BP 104/70  Pulse 79  Temp(Src) 98.5 F (36.9 C) (Oral)  Ht 5' 2.25" (1.581 m)  Wt 178 lb  6.4 oz (80.922 kg)  BMI 32.37 kg/m2  SpO2 98% General appearance: alert, cooperative, appears stated age and no distress Head: Normocephalic, without obvious abnormality, atraumatic Eyes: conjunctivae/corneas clear. PERRL, EOM's intact. Fundi benign. Ears: normal TM's and external ear canals both ears Nose: Nares normal. Septum midline. Mucosa  normal. No drainage or sinus tenderness. Throat: lips, mucosa, and tongue normal; teeth and gums normal Neck: no adenopathy, no carotid bruit, no JVD, supple, symmetrical, trachea midline and thyroid not enlarged, symmetric, no tenderness/mass/nodules Back: symmetric, no curvature. ROM normal. No CVA tenderness. Lungs: clear to auscultation bilaterally Breasts: normal appearance, no masses or tenderness Heart: regular rate and rhythm, S1, S2 normal, no murmur, click, rub or gallop Abdomen: soft, non-tender; bowel sounds normal; no masses,  no organomegaly Pelvic: external genitalia normal, no adnexal masses or tenderness, vagina normal without discharge and pap done  uterus surgically absent Extremities: extremities normal, atraumatic, no cyanosis or edema Pulses: 2+ and symmetric Skin: Skin color, texture, turgor normal. No rashes or lesions Lymph nodes: Cervical, supraclavicular, and axillary nodes normal. Neurologic: Alert and oriented X 3, normal strength and tone. Normal symmetric reflexes. Normal coordination and gait Psych-- no depression, no anxiety      Assessment:    Healthy female exam.      Plan:     check labs See avs See After Visit Summary for Counseling Recommendations

## 2013-10-04 NOTE — Assessment & Plan Note (Signed)
Refer to neuro 

## 2013-10-04 NOTE — Patient Instructions (Signed)
Preventive Care for Adults, Female A healthy lifestyle and preventive care can promote health and wellness. Preventive health guidelines for women include the following key practices.  A routine yearly physical is a good way to check with your caregiver about your health and preventive screening. It is a chance to share any concerns and updates on your health, and to receive a thorough exam.  Visit your dentist for a routine exam and preventive care every 6 months. Brush your teeth twice a day and floss once a day. Good oral hygiene prevents tooth decay and gum disease.  The frequency of eye exams is based on your age, health, family medical history, use of contact lenses, and other factors. Follow your caregiver's recommendations for frequency of eye exams.  Eat a healthy diet. Foods like vegetables, fruits, whole grains, low-fat dairy products, and lean protein foods contain the nutrients you need without too many calories. Decrease your intake of foods high in solid fats, added sugars, and salt. Eat the right amount of calories for you.Get information about a proper diet from your caregiver, if necessary.  Regular physical exercise is one of the most important things you can do for your health. Most adults should get at least 150 minutes of moderate-intensity exercise (any activity that increases your heart rate and causes you to sweat) each week. In addition, most adults need muscle-strengthening exercises on 2 or more days a week.  Maintain a healthy weight. The body mass index (BMI) is a screening tool to identify possible weight problems. It provides an estimate of body fat based on height and weight. Your caregiver can help determine your BMI, and can help you achieve or maintain a healthy weight.For adults 20 years and older:  A BMI below 18.5 is considered underweight.  A BMI of 18.5 to 24.9 is normal.  A BMI of 25 to 29.9 is considered overweight.  A BMI of 30 and above is  considered obese.  Maintain normal blood lipids and cholesterol levels by exercising and minimizing your intake of saturated fat. Eat a balanced diet with plenty of fruit and vegetables. Blood tests for lipids and cholesterol should begin at age 20 and be repeated every 5 years. If your lipid or cholesterol levels are high, you are over 50, or you are at high risk for heart disease, you may need your cholesterol levels checked more frequently.Ongoing high lipid and cholesterol levels should be treated with medicines if diet and exercise are not effective.  If you smoke, find out from your caregiver how to quit. If you do not use tobacco, do not start.  Lung cancer screening is recommended for adults aged 55 80 years who are at high risk for developing lung cancer because of a history of smoking. Yearly low-dose computed tomography (CT) is recommended for people who have at least a 30-pack-year history of smoking and are a current smoker or have quit within the past 15 years. A pack year of smoking is smoking an average of 1 pack of cigarettes a day for 1 year (for example: 1 pack a day for 30 years or 2 packs a day for 15 years). Yearly screening should continue until the smoker has stopped smoking for at least 15 years. Yearly screening should also be stopped for people who develop a health problem that would prevent them from having lung cancer treatment.  If you are pregnant, do not drink alcohol. If you are breastfeeding, be very cautious about drinking alcohol. If you are   not pregnant and choose to drink alcohol, do not exceed 1 drink per day. One drink is considered to be 12 ounces (355 mL) of beer, 5 ounces (148 mL) of wine, or 1.5 ounces (44 mL) of liquor.  Avoid use of street drugs. Do not share needles with anyone. Ask for help if you need support or instructions about stopping the use of drugs.  High blood pressure causes heart disease and increases the risk of stroke. Your blood pressure  should be checked at least every 1 to 2 years. Ongoing high blood pressure should be treated with medicines if weight loss and exercise are not effective.  If you are 55 to 55 years old, ask your caregiver if you should take aspirin to prevent strokes.  Diabetes screening involves taking a blood sample to check your fasting blood sugar level. This should be done once every 3 years, after age 45, if you are within normal weight and without risk factors for diabetes. Testing should be considered at a younger age or be carried out more frequently if you are overweight and have at least 1 risk factor for diabetes.  Breast cancer screening is essential preventive care for women. You should practice "breast self-awareness." This means understanding the normal appearance and feel of your breasts and may include breast self-examination. Any changes detected, no matter how small, should be reported to a caregiver. Women in their 20s and 30s should have a clinical breast exam (CBE) by a caregiver as part of a regular health exam every 1 to 3 years. After age 40, women should have a CBE every year. Starting at age 40, women should consider having a mammography (breast X-ray test) every year. Women who have a family history of breast cancer should talk to their caregiver about genetic screening. Women at a high risk of breast cancer should talk to their caregivers about having magnetic resonance imaging (MRI) and a mammography every year.  Breast cancer gene (BRCA)-related cancer risk assessment is recommended for women who have family members with BRCA-related cancers. BRCA-related cancers include breast, ovarian, tubal, and peritoneal cancers. Having family members with these cancers may be associated with an increased risk for harmful changes (mutations) in the breast cancer genes BRCA1 and BRCA2. Results of the assessment will determine the need for genetic counseling and BRCA1 and BRCA2 testing.  The Pap test is  a screening test for cervical cancer. A Pap test can show cell changes on the cervix that might become cervical cancer if left untreated. A Pap test is a procedure in which cells are obtained and examined from the lower end of the uterus (cervix).  Women should have a Pap test starting at age 21.  Between ages 21 and 29, Pap tests should be repeated every 2 years.  Beginning at age 30, you should have a Pap test every 3 years as long as the past 3 Pap tests have been normal.  Some women have medical problems that increase the chance of getting cervical cancer. Talk to your caregiver about these problems. It is especially important to talk to your caregiver if a new problem develops soon after your last Pap test. In these cases, your caregiver may recommend more frequent screening and Pap tests.  The above recommendations are the same for women who have or have not gotten the vaccine for human papillomavirus (HPV).  If you had a hysterectomy for a problem that was not cancer or a condition that could lead to cancer, then   you no longer need Pap tests. Even if you no longer need a Pap test, a regular exam is a good idea to make sure no other problems are starting.  If you are between ages 65 and 70, and you have had normal Pap tests going back 10 years, you no longer need Pap tests. Even if you no longer need a Pap test, a regular exam is a good idea to make sure no other problems are starting.  If you have had past treatment for cervical cancer or a condition that could lead to cancer, you need Pap tests and screening for cancer for at least 20 years after your treatment.  If Pap tests have been discontinued, risk factors (such as a new sexual partner) need to be reassessed to determine if screening should be resumed.  The HPV test is an additional test that may be used for cervical cancer screening. The HPV test looks for the virus that can cause the cell changes on the cervix. The cells collected  during the Pap test can be tested for HPV. The HPV test could be used to screen women aged 30 years and older, and should be used in women of any age who have unclear Pap test results. After the age of 30, women should have HPV testing at the same frequency as a Pap test.  Colorectal cancer can be detected and often prevented. Most routine colorectal cancer screening begins at the age of 50 and continues through age 75. However, your caregiver may recommend screening at an earlier age if you have risk factors for colon cancer. On a yearly basis, your caregiver may provide home test kits to check for hidden blood in the stool. Use of a small camera at the end of a tube, to directly examine the colon (sigmoidoscopy or colonoscopy), can detect the earliest forms of colorectal cancer. Talk to your caregiver about this at age 50, when routine screening begins. Direct examination of the colon should be repeated every 5 to 10 years through age 75, unless early forms of pre-cancerous polyps or small growths are found.  Hepatitis C blood testing is recommended for all people born from 1945 through 1965 and any individual with known risks for hepatitis C.  Practice safe sex. Use condoms and avoid high-risk sexual practices to reduce the spread of sexually transmitted infections (STIs). STIs include gonorrhea, chlamydia, syphilis, trichomonas, herpes, HPV, and human immunodeficiency virus (HIV). Herpes, HIV, and HPV are viral illnesses that have no cure. They can result in disability, cancer, and death. Sexually active women aged 25 and younger should be checked for chlamydia. Older women with new or multiple partners should also be tested for chlamydia. Testing for other STIs is recommended if you are sexually active and at increased risk.  Osteoporosis is a disease in which the bones lose minerals and strength with aging. This can result in serious bone fractures. The risk of osteoporosis can be identified using a  bone density scan. Women ages 65 and over and women at risk for fractures or osteoporosis should discuss screening with their caregivers. Ask your caregiver whether you should take a calcium supplement or vitamin D to reduce the rate of osteoporosis.  Menopause can be associated with physical symptoms and risks. Hormone replacement therapy is available to decrease symptoms and risks. You should talk to your caregiver about whether hormone replacement therapy is right for you.  Use sunscreen. Apply sunscreen liberally and repeatedly throughout the day. You should seek shade   when your shadow is shorter than you. Protect yourself by wearing long sleeves, pants, a wide-brimmed hat, and sunglasses year round, whenever you are outdoors.  Once a month, do a whole body skin exam, using a mirror to look at the skin on your back. Notify your caregiver of new moles, moles that have irregular borders, moles that are larger than a pencil eraser, or moles that have changed in shape or color.  Stay current with required immunizations.  Influenza vaccine. All adults should be immunized every year.  Tetanus, diphtheria, and acellular pertussis (Td, Tdap) vaccine. Pregnant women should receive 1 dose of Tdap vaccine during each pregnancy. The dose should be obtained regardless of the length of time since the last dose. Immunization is preferred during the 27th to 36th week of gestation. An adult who has not previously received Tdap or who does not know her vaccine status should receive 1 dose of Tdap. This initial dose should be followed by tetanus and diphtheria toxoids (Td) booster doses every 10 years. Adults with an unknown or incomplete history of completing a 3-dose immunization series with Td-containing vaccines should begin or complete a primary immunization series including a Tdap dose. Adults should receive a Td booster every 10 years.  Varicella vaccine. An adult without evidence of immunity to varicella  should receive 2 doses or a second dose if she has previously received 1 dose. Pregnant females who do not have evidence of immunity should receive the first dose after pregnancy. This first dose should be obtained before leaving the health care facility. The second dose should be obtained 4 8 weeks after the first dose.  Human papillomavirus (HPV) vaccine. Females aged 13 26 years who have not received the vaccine previously should obtain the 3-dose series. The vaccine is not recommended for use in pregnant females. However, pregnancy testing is not needed before receiving a dose. If a female is found to be pregnant after receiving a dose, no treatment is needed. In that case, the remaining doses should be delayed until after the pregnancy. Immunization is recommended for any person with an immunocompromised condition through the age of 26 years if she did not get any or all doses earlier. During the 3-dose series, the second dose should be obtained 4 8 weeks after the first dose. The third dose should be obtained 24 weeks after the first dose and 16 weeks after the second dose.  Zoster vaccine. One dose is recommended for adults aged 60 years or older unless certain conditions are present.  Measles, mumps, and rubella (MMR) vaccine. Adults born before 1957 generally are considered immune to measles and mumps. Adults born in 1957 or later should have 1 or more doses of MMR vaccine unless there is a contraindication to the vaccine or there is laboratory evidence of immunity to each of the three diseases. A routine second dose of MMR vaccine should be obtained at least 28 days after the first dose for students attending postsecondary schools, health care workers, or international travelers. People who received inactivated measles vaccine or an unknown type of measles vaccine during 1963 1967 should receive 2 doses of MMR vaccine. People who received inactivated mumps vaccine or an unknown type of mumps vaccine  before 1979 and are at high risk for mumps infection should consider immunization with 2 doses of MMR vaccine. For females of childbearing age, rubella immunity should be determined. If there is no evidence of immunity, females who are not pregnant should be vaccinated. If there   is no evidence of immunity, females who are pregnant should delay immunization until after pregnancy. Unvaccinated health care workers born before 1957 who lack laboratory evidence of measles, mumps, or rubella immunity or laboratory confirmation of disease should consider measles and mumps immunization with 2 doses of MMR vaccine or rubella immunization with 1 dose of MMR vaccine.  Pneumococcal 13-valent conjugate (PCV13) vaccine. When indicated, a person who is uncertain of her immunization history and has no record of immunization should receive the PCV13 vaccine. An adult aged 19 years or older who has certain medical conditions and has not been previously immunized should receive 1 dose of PCV13 vaccine. This PCV13 should be followed with a dose of pneumococcal polysaccharide (PPSV23) vaccine. The PPSV23 vaccine dose should be obtained at least 8 weeks after the dose of PCV13 vaccine. An adult aged 19 years or older who has certain medical conditions and previously received 1 or more doses of PPSV23 vaccine should receive 1 dose of PCV13. The PCV13 vaccine dose should be obtained 1 or more years after the last PPSV23 vaccine dose.  Pneumococcal polysaccharide (PPSV23) vaccine. When PCV13 is also indicated, PCV13 should be obtained first. All adults aged 65 years and older should be immunized. An adult younger than age 65 years who has certain medical conditions should be immunized. Any person who resides in a nursing home or long-term care facility should be immunized. An adult smoker should be immunized. People with an immunocompromised condition and certain other conditions should receive both PCV13 and PPSV23 vaccines. People  with human immunodeficiency virus (HIV) infection should be immunized as soon as possible after diagnosis. Immunization during chemotherapy or radiation therapy should be avoided. Routine use of PPSV23 vaccine is not recommended for American Indians, [place] Natives, or people younger than 65 years unless there are medical conditions that require PPSV23 vaccine. When indicated, people who have unknown immunization and have no record of immunization should receive PPSV23 vaccine. One-time revaccination 5 years after the first dose of PPSV23 is recommended for people aged 19 64 years who have chronic kidney failure, nephrotic syndrome, asplenia, or immunocompromised conditions. People who received 1 2 doses of PPSV23 before age 65 years should receive another dose of PPSV23 vaccine at age 65 years or later if at least 5 years have passed since the previous dose. Doses of PPSV23 are not needed for people immunized with PPSV23 at or after age 65 years.  Meningococcal vaccine. Adults with asplenia or persistent complement component deficiencies should receive 2 doses of quadrivalent meningococcal conjugate (MenACWY-D) vaccine. The doses should be obtained at least 2 months apart. Microbiologists working with certain meningococcal bacteria, military recruits, people at risk during an outbreak, and people who travel to or live in countries with a high rate of meningitis should be immunized. A first-year college student up through age 21 years who is living in a residence hall should receive a dose if she did not receive a dose on or after her 16th birthday. Adults who have certain high-risk conditions should receive one or more doses of vaccine.  Hepatitis A vaccine. Adults who wish to be protected from this disease, have certain high-risk conditions, work with hepatitis A-infected animals, work in hepatitis A research labs, or travel to or work in countries with a high rate of hepatitis A should be immunized. Adults  who were previously unvaccinated and who anticipate close contact with an international adoptee during the first 60 days after arrival in the [place] from a country   with a high rate of hepatitis A should be immunized.  Hepatitis B vaccine. Adults who wish to be protected from this disease, have certain high-risk conditions, may be exposed to blood or other infectious body fluids, are household contacts or sex partners of hepatitis B positive people, are clients or workers in certain care facilities, or travel to or work in countries with a high rate of hepatitis B should be immunized.  Haemophilus influenzae type b (Hib) vaccine. A previously unvaccinated person with asplenia or sickle cell disease or having a scheduled splenectomy should receive 1 dose of Hib vaccine. Regardless of previous immunization, a recipient of a hematopoietic stem cell transplant should receive a 3-dose series 6 12 months after her successful transplant. Hib vaccine is not recommended for adults with HIV infection. Preventive Services / Frequency Ages 19 to 39  Blood pressure check.** / Every 1 to 2 years.  Lipid and cholesterol check.** / Every 5 years beginning at age 20.  Clinical breast exam.** / Every 3 years for women in their 20s and 30s.  BRCA-related cancer risk assessment.** / For women who have family members with a BRCA-related cancer (breast, ovarian, tubal, or peritoneal cancers).  Pap test.** / Every 2 years from ages 21 through 29. Every 3 years starting at age 30 through age 65 or 70 with a history of 3 consecutive normal Pap tests.  HPV screening.** / Every 3 years from ages 30 through ages 65 to 70 with a history of 3 consecutive normal Pap tests.  Hepatitis C blood test.** / For any individual with known risks for hepatitis C.  Skin self-exam. / Monthly.  Influenza vaccine. / Every year.  Tetanus, diphtheria, and acellular pertussis (Tdap, Td) vaccine.** / Consult your caregiver. Pregnant  women should receive 1 dose of Tdap vaccine during each pregnancy. 1 dose of Td every 10 years.  Varicella vaccine.** / Consult your caregiver. Pregnant females who do not have evidence of immunity should receive the first dose after pregnancy.  HPV vaccine. / 3 doses over 6 months, if 26 and younger. The vaccine is not recommended for use in pregnant females. However, pregnancy testing is not needed before receiving a dose.  Measles, mumps, rubella (MMR) vaccine.** / You need at least 1 dose of MMR if you were born in 1957 or later. You may also need a 2nd dose. For females of childbearing age, rubella immunity should be determined. If there is no evidence of immunity, females who are not pregnant should be vaccinated. If there is no evidence of immunity, females who are pregnant should delay immunization until after pregnancy.  Pneumococcal 13-valent conjugate (PCV13) vaccine.** / Consult your caregiver.  Pneumococcal polysaccharide (PPSV23) vaccine.** / 1 to 2 doses if you smoke cigarettes or if you have certain conditions.  Meningococcal vaccine.** / 1 dose if you are age 19 to 21 years and a first-year college student living in a residence hall, or have one of several medical conditions, you need to get vaccinated against meningococcal disease. You may also need additional booster doses.  Hepatitis A vaccine.** / Consult your caregiver.  Hepatitis B vaccine.** / Consult your caregiver.  Haemophilus influenzae type b (Hib) vaccine.** / Consult your caregiver. Ages 40 to 64  Blood pressure check.** / Every 1 to 2 years.  Lipid and cholesterol check.** / Every 5 years beginning at age 20.  Lung cancer screening. / Every year if you are aged 55 80 years and have a 30-pack-year history of smoking and   currently smoke or have quit within the past 15 years. Yearly screening is stopped once you have quit smoking for at least 15 years or develop a health problem that would prevent you from having  lung cancer treatment.  Clinical breast exam.** / Every year after age 40.  BRCA-related cancer risk assessment.** / For women who have family members with a BRCA-related cancer (breast, ovarian, tubal, or peritoneal cancers).  Mammogram.** / Every year beginning at age 40 and continuing for as long as you are in good health. Consult with your caregiver.  Pap test.** / Every 3 years starting at age 30 through age 65 or 70 with a history of 3 consecutive normal Pap tests.  HPV screening.** / Every 3 years from ages 30 through ages 65 to 70 with a history of 3 consecutive normal Pap tests.  Fecal occult blood test (FOBT) of stool. / Every year beginning at age 50 and continuing until age 75. You may not need to do this test if you get a colonoscopy every 10 years.  Flexible sigmoidoscopy or colonoscopy.** / Every 5 years for a flexible sigmoidoscopy or every 10 years for a colonoscopy beginning at age 50 and continuing until age 75.  Hepatitis C blood test.** / For all people born from 1945 through 1965 and any individual with known risks for hepatitis C.  Skin self-exam. / Monthly.  Influenza vaccine. / Every year.  Tetanus, diphtheria, and acellular pertussis (Tdap/Td) vaccine.** / Consult your caregiver. Pregnant women should receive 1 dose of Tdap vaccine during each pregnancy. 1 dose of Td every 10 years.  Varicella vaccine.** / Consult your caregiver. Pregnant females who do not have evidence of immunity should receive the first dose after pregnancy.  Zoster vaccine.** / 1 dose for adults aged 60 years or older.  Measles, mumps, rubella (MMR) vaccine.** / You need at least 1 dose of MMR if you were born in 1957 or later. You may also need a 2nd dose. For females of childbearing age, rubella immunity should be determined. If there is no evidence of immunity, females who are not pregnant should be vaccinated. If there is no evidence of immunity, females who are pregnant should delay  immunization until after pregnancy.  Pneumococcal 13-valent conjugate (PCV13) vaccine.** / Consult your caregiver.  Pneumococcal polysaccharide (PPSV23) vaccine.** / 1 to 2 doses if you smoke cigarettes or if you have certain conditions.  Meningococcal vaccine.** / Consult your caregiver.  Hepatitis A vaccine.** / Consult your caregiver.  Hepatitis B vaccine.** / Consult your caregiver.  Haemophilus influenzae type b (Hib) vaccine.** / Consult your caregiver. Ages 65 and over  Blood pressure check.** / Every 1 to 2 years.  Lipid and cholesterol check.** / Every 5 years beginning at age 20.  Lung cancer screening. / Every year if you are aged 55 80 years and have a 30-pack-year history of smoking and currently smoke or have quit within the past 15 years. Yearly screening is stopped once you have quit smoking for at least 15 years or develop a health problem that would prevent you from having lung cancer treatment.  Clinical breast exam.** / Every year after age 40.  BRCA-related cancer risk assessment.** / For women who have family members with a BRCA-related cancer (breast, ovarian, tubal, or peritoneal cancers).  Mammogram.** / Every year beginning at age 40 and continuing for as long as you are in good health. Consult with your caregiver.  Pap test.** / Every 3 years starting at age   30 through age 65 or 70 with a 3 consecutive normal Pap tests. Testing can be stopped between 65 and 70 with 3 consecutive normal Pap tests and no abnormal Pap or HPV tests in the past 10 years.  HPV screening.** / Every 3 years from ages 30 through ages 65 or 70 with a history of 3 consecutive normal Pap tests. Testing can be stopped between 65 and 70 with 3 consecutive normal Pap tests and no abnormal Pap or HPV tests in the past 10 years.  Fecal occult blood test (FOBT) of stool. / Every year beginning at age 50 and continuing until age 75. You may not need to do this test if you get a colonoscopy  every 10 years.  Flexible sigmoidoscopy or colonoscopy.** / Every 5 years for a flexible sigmoidoscopy or every 10 years for a colonoscopy beginning at age 50 and continuing until age 75.  Hepatitis C blood test.** / For all people born from 1945 through 1965 and any individual with known risks for hepatitis C.  Osteoporosis screening.** / A one-time screening for women ages 65 and over and women at risk for fractures or osteoporosis.  Skin self-exam. / Monthly.  Influenza vaccine. / Every year.  Tetanus, diphtheria, and acellular pertussis (Tdap/Td) vaccine.** / 1 dose of Td every 10 years.  Varicella vaccine.** / Consult your caregiver.  Zoster vaccine.** / 1 dose for adults aged 60 years or older.  Pneumococcal 13-valent conjugate (PCV13) vaccine.** / Consult your caregiver.  Pneumococcal polysaccharide (PPSV23) vaccine.** / 1 dose for all adults aged 65 years and older.  Meningococcal vaccine.** / Consult your caregiver.  Hepatitis A vaccine.** / Consult your caregiver.  Hepatitis B vaccine.** / Consult your caregiver.  Haemophilus influenzae type b (Hib) vaccine.** / Consult your caregiver. ** Family history and personal history of risk and conditions may change your caregiver's recommendations. Document Released: 12/20/2001 Document Revised: 02/18/2013 Document Reviewed: 03/21/2011 Exit[employer] Patient Information 2014 [employer].  

## 2013-10-05 ENCOUNTER — Other Ambulatory Visit: Payer: Self-pay | Admitting: Family Medicine

## 2013-10-09 ENCOUNTER — Encounter: Payer: Self-pay | Admitting: Neurology

## 2013-10-09 ENCOUNTER — Ambulatory Visit (INDEPENDENT_AMBULATORY_CARE_PROVIDER_SITE_OTHER): Payer: BC Managed Care – PPO | Admitting: Neurology

## 2013-10-09 VITALS — BP 100/70 | HR 75 | Temp 98.1°F | Resp 12 | Ht 62.0 in | Wt 175.0 lb

## 2013-10-09 DIAGNOSIS — G250 Essential tremor: Secondary | ICD-10-CM

## 2013-10-09 DIAGNOSIS — G252 Other specified forms of tremor: Secondary | ICD-10-CM | POA: Insufficient documentation

## 2013-10-09 NOTE — Progress Notes (Signed)
Subjective:    Kristen Underwood was seen in consultation in the movement disorder clinic at the request of Loreen Freud, DO.  The evaluation is for tremor.  The patient is a 57 y.o. right handed female with a history of tremor that began slightly about 20 years ago, but has gotten worse over the last 6-7 months.   When she holds her cell phone up to take a picture, the R hand will tremor.  The L hand does not shake.  She notes intermittent problems with writing and with eating soup.There is no family hx of tremor.    Affected by caffeine: unknown Affected by alcohol:  no Affected by stress:  no Affected by fatigue:  yes Spills soup if on spoon:  yes Spills glass of liquid if full:  no Affects ADL's (tying shoes, brushing teeth, etc):  Some trouble clasping necklaces; cannot put on makeup  Current/Previously tried tremor medications: n/a  Current medications that may exacerbate tremor:  n/a  Outside reports reviewed: historical medical records and imaging reports: MRI brain as below.  MRI brain 07/25/10:  Clinical Data: Recent chiropractor manipulation. Now with  dizziness and posterior neck pain. History of high blood pressure.  MRI HEAD WITH AND WITHOUT CONTRAST  MRA HEAD WITHOUT CONTRAST  Technique: Multiplanar, multiecho pulse sequences of the brain and  surrounding structures were obtained according to standard protocol  with and without intravenous contrast. Angiographic images of the  Circle of Willis were obtained using MRA technique without  intravenous contrast.  Contrast: 15 ml MultiHance.  Comparison05/28/2011 CT. No comparison MR.  MRI HEAD  Findings: No acute infarct. No intracranial hemorrhage. No  abnormal intracranial enhancing lesion.  Minimal nonspecific white matter type changes with largest  measuring 5 x 3 mm left periatrial region. As the patient has a  history of high blood pressure this may be related to result of  small vessel disease. Other causes of white  matter type changes  which cannot be completely excluded include that secondary to;  demyelinating process, vasculitis, inflammatory process or migraine  headaches.  Major intracranial vascular structures are patent with questionable  abnormality of the left vertebral artery as discussed below.  Incidentally noticed is small calcified pineal gland with minimal  impression upon the superior colliculus appearing unchanged when  compared to remote CT.  Polypoid opacification inferior aspect of the maxillary sinuses  more notable on the right. Partial opacification left sphenoid  sinus air cell, minimal mucosal thickening frontal sinuses and  ethmoid sinus air cells as well as aerated aspect of the upper  right pterygoid plate.  IMPRESSION:  No acute infarct.  Minimal nonspecific white matter type changes as discussed above.  Question abnormality left vertebral artery. Please see below.  Mild paranasal sinus opacification.  MRA HEAD  Findings: The left vertebral artery is dominant size. There is a  change in caliber of the left vertebral artery proximal to the  formation of the PICA. Typically in a patient with high blood  pressure and with surrounding intracranial atherosclerotic type  changes, this appearance of the left vertebral artery would be  attributed to result of atherosclerosis. The provided history is  that the patient had recent neck manipulation with neck pain and  therefore subtle dissection cannot be absolutely excluded. On MR  images there is minimal peripheral increased signal which may  represent atherosclerotic type changes or result of dissection  (this finding does not clearly separate these possibilities). Flow  is seen within portions of  the left vertebral artery to the C4  level on coronal imaging. If further delineation of the left  vertebral artery is clinically desired then MR angiogram or CT  angiogram of the neck may be considered.  Mild irregularity of  the distal right vertebral artery.  Nonvisualization of the right PICA. This may be related to the  dominant appearance of the right AICA. Nonvisualization of the  left AICA. Mild to slightly moderate narrowing of the mid basilar  artery.  Fetal type origin of the left posterior cerebral artery. Moderate  narrowing mid to distal left posterior cerebral artery. Mild  narrowing right posterior cerebral artery. Poor delineation of  posterior cerebral artery distal branches.  Mild irregularity of the superior cerebellar arteries bilaterally.  Mild ectasia with adjacent mild narrowing cavernous segment of the  internal carotid arteries bilaterally. Mild focal narrowing distal  supraclinoid aspect right internal carotid artery.  Mild to moderate narrowing proximal A2 segment right anterior  cerebral artery.  Moderate narrowing middle cerebral artery branch vessels more  notable on the right. No aneurysm noted.  IMPRESSION:  Intracranial atherosclerotic type changes as noted above. The  appearance of the left vertebral artery is probably related to  atherosclerotic disease given the patient's history of high blood  pressure and surrounding intracranial findings. Dissection is felt  to be a less likely consideration but cannot be completely excluded  given the patient's history as discussed above.    No Known Allergies  Current Outpatient Prescriptions on File Prior to Visit  Medication Sig Dispense Refill  . cholecalciferol (VITAMIN D) 1000 UNITS tablet Take 1,000 Units by mouth daily.        . fluticasone (FLONASE) 50 MCG/ACT nasal spray 2 sprays each nostril qd  16 g  6  . lisinopril-hydrochlorothiazide (PRINZIDE,ZESTORETIC) 10-12.5 MG per tablet TAKE 1 TABLET BY MOUTH DAILY.  90 tablet  3  . simvastatin (ZOCOR) 40 MG tablet TAKE 1 TABLET AT BEDTIME  30 tablet  5   No current facility-administered medications on file prior to visit.    Past Medical History  Diagnosis Date  .  Elevated cholesterol   . Back pain   . Neck pain   . BRCA negative   . Hypertension     10/09/13:  pt denies    Past Surgical History  Procedure Laterality Date  . Abdominal hysterectomy    . Appendectomy    . Tonsillectomy and adenoidectomy  1966    History   Social History  . Marital Status: Married    Spouse Name: N/A    Number of Children: 3  . Years of Education: N/A   Occupational History  . PSYCHOLOGIST    Social History Main Topics  . Smoking status: Never Smoker   . Smokeless tobacco: Not on file  . Alcohol Use: Yes     Comment: RARE,SOCIAL  . Drug Use: No  . Sexual Activity: Yes    Partners: Male   Other Topics Concern  . Not on file   Social History Narrative   Exercise--not lately    Family Status  Relation Status Death Age  . Mother Deceased 1    DM, lung CA  . Father Deceased 64    DVT  . Sister Alive     HEALTHY  . Daughter Alive     3, Rheumatoid Arthritis, Asthma    Review of Systems A complete 10 system ROS was obtained and was negative apart from what is mentioned.   Objective:  VITALS:   Filed Vitals:   10/09/13 0803  BP: 100/70  Pulse: 75  Temp: 98.1 F (36.7 C)  Resp: 12  Height: 5\' 2"  (1.575 m)  Weight: 175 lb (79.379 kg)   Gen:  Appears stated age and in NAD. HEENT:  Normocephalic, atraumatic. The mucous membranes are moist. The superficial temporal arteries are without ropiness or tenderness. Cardiovascular: Regular rate and rhythm. Lungs: Clear to auscultation bilaterally. Neck: There are no carotid bruits noted bilaterally.  NEUROLOGICAL:  Orientation:  The patient is alert and oriented x 3.  Recent and remote memory are intact.  Attention span and concentration are normal.  Able to name objects and repeat without trouble.  Fund of knowledge is appropriate Cranial nerves: There is good facial symmetry. The pupils are equal round and reactive to light bilaterally. Fundoscopic exam reveals clear disc margins  bilaterally. Extraocular muscles are intact and visual fields are full to confrontational testing. Speech is fluent and clear. Soft palate rises symmetrically and there is no tongue deviation. Hearing is intact to conversational tone. Tone: Tone is good throughout. Sensation: Sensation is intact to light touch and pinprick throughout (facial, trunk, extremities). Vibration is intact at the bilateral big toe. There is no extinction with double simultaneous stimulation. There is no sensory dermatomal level identified. Coordination:  The patient has no dysdiadichokinesia or dysmetria. Motor: Strength is 5/5 in the bilateral upper and lower extremities.  Shoulder shrug is equal bilaterally.  There is no pronator drift.  There are no fasciculations noted. DTR's: Deep tendon reflexes are 2/4 at the bilateral biceps, triceps, brachioradialis, patella and achilles.  Plantar responses are downgoing bilaterally. Gait and Station: The patient is able to ambulate without difficulty. The patient is able to heel toe walk without any difficulty. The patient is able to ambulate in a tandem fashion. The patient is able to stand in the Romberg position.   MOVEMENT EXAM: Tremor:  There is a tremor in the RUE, noted most significantly with intention.  There is a slight postural tremor on the R but no rest tremor.  There is no tremor on the L.  The patient is able to draw Archimedes spirals without significant difficulty.   The patient is able to pour water from one glass to another without spilling it.  LABS  Lab Results  Component Value Date   WBC 5.4 10/04/2013   HGB 13.2 10/04/2013   HCT 38.2 10/04/2013   MCV 86.7 10/04/2013   PLT 349.0 10/04/2013   Lab Results  Component Value Date   TSH 1.30 10/04/2013     Chemistry      Component Value Date/Time   NA 136 10/04/2013 1123   K 4.1 10/04/2013 1123   CL 103 10/04/2013 1123   CO2 26 10/04/2013 1123   BUN 19 10/04/2013 1123   CREATININE 0.8 10/04/2013  1123      Component Value Date/Time   CALCIUM 9.9 10/04/2013 1123   ALKPHOS 61 10/04/2013 1123   AST 38* 10/04/2013 1123   ALT 42* 10/04/2013 1123   BILITOT 0.5 10/04/2013 1123          Assessment/Plan:   1.  Essential Tremor.  -We discussed the diagnosis as well as pathophysiology of the disease.  We discussed treatment options as well as prognostic indicators.  Patient education was provided.  -In the end the pt decided that she did not want medication for this.  She just wanted reassurance that she did not have PD.  I reassured  her that I saw no evidence of a neurodegenerative process.  -I will see her on an as needed basis.

## 2013-10-09 NOTE — Patient Instructions (Signed)
1.  Your diagnosis is essential tremor.  Follow up with me as needed.

## 2013-10-22 ENCOUNTER — Other Ambulatory Visit: Payer: Self-pay | Admitting: Family Medicine

## 2014-04-22 ENCOUNTER — Other Ambulatory Visit: Payer: Self-pay | Admitting: Family Medicine

## 2014-04-22 NOTE — Telephone Encounter (Signed)
Rx's sent to the pharmacy by e-script.//AB/CMA 

## 2014-05-23 ENCOUNTER — Other Ambulatory Visit: Payer: Self-pay | Admitting: Family Medicine

## 2014-05-23 NOTE — Telephone Encounter (Signed)
Pt called in and stated that she is leaving to go out of town for 2 weeks and would like this refilled as soon as possible.

## 2014-05-26 NOTE — Telephone Encounter (Signed)
Rx's sent to the pharmacy by e-script.  Pt needs office visit now.//AB/CMA

## 2014-05-28 ENCOUNTER — Other Ambulatory Visit: Payer: Self-pay | Admitting: Family Medicine

## 2014-05-29 NOTE — Telephone Encounter (Signed)
Refill Requests:  simvastatin (ZOCOR) 40 MG tablet--TAKE 1 TABLET AT BEDTIME. Last Filled:  05/26/14 Amt Filled:  15 tabs, 0 refills   lisinopril-hydrochlorothiazide (PRINZIDE,ZESTORETIC) 10-12.5 MG per tablet--TAKE 1 TABLET BY MOUTH DAILY. Last Filled: 05/26/14 Amt Filled:  15 tabs, 0 refills  Last OV:  10/04/13 Labs: 10/04/13  Pt needs an office visit.  Medication follow up scheduled for 06/06/14 @ 11:15 am.

## 2014-06-06 ENCOUNTER — Ambulatory Visit: Payer: BC Managed Care – PPO | Admitting: Family Medicine

## 2014-06-12 ENCOUNTER — Ambulatory Visit (INDEPENDENT_AMBULATORY_CARE_PROVIDER_SITE_OTHER): Payer: BC Managed Care – PPO | Admitting: Family Medicine

## 2014-06-12 ENCOUNTER — Encounter: Payer: Self-pay | Admitting: Family Medicine

## 2014-06-12 VITALS — BP 114/68 | HR 73 | Temp 98.0°F | Wt 176.0 lb

## 2014-06-12 DIAGNOSIS — E785 Hyperlipidemia, unspecified: Secondary | ICD-10-CM

## 2014-06-12 DIAGNOSIS — I10 Essential (primary) hypertension: Secondary | ICD-10-CM

## 2014-06-12 DIAGNOSIS — E669 Obesity, unspecified: Secondary | ICD-10-CM

## 2014-06-12 LAB — LIPID PANEL
CHOLESTEROL: 276 mg/dL — AB (ref 0–200)
HDL: 73.6 mg/dL (ref >39.00)
LDL Cholesterol: 183 mg/dL — ABNORMAL HIGH (ref 0–99)
NonHDL: 202.4
Total CHOL/HDL Ratio: 4
Triglycerides: 97 mg/dL (ref 0.0–149.0)
VLDL: 19.4 mg/dL (ref 0.0–40.0)

## 2014-06-12 LAB — BASIC METABOLIC PANEL
BUN: 19 mg/dL (ref 6–23)
CHLORIDE: 101 meq/L (ref 96–112)
CO2: 25 meq/L (ref 19–32)
Calcium: 9.9 mg/dL (ref 8.4–10.5)
Creatinine, Ser: 1 mg/dL (ref 0.4–1.2)
GFR: 63.11 mL/min (ref >60.00)
GLUCOSE: 95 mg/dL (ref 70–99)
Potassium: 3.8 mEq/L (ref 3.5–5.1)
SODIUM: 135 meq/L (ref 135–145)

## 2014-06-12 LAB — HEPATIC FUNCTION PANEL
ALBUMIN: 4.1 g/dL (ref 3.5–5.2)
ALT: 26 U/L (ref 0–35)
AST: 20 U/L (ref 0–37)
Alkaline Phosphatase: 61 U/L (ref 39–117)
Bilirubin, Direct: 0 mg/dL (ref 0.0–0.3)
TOTAL PROTEIN: 7.1 g/dL (ref 6.0–8.3)
Total Bilirubin: 0.7 mg/dL (ref 0.2–1.2)

## 2014-06-12 MED ORDER — LISINOPRIL-HYDROCHLOROTHIAZIDE 10-12.5 MG PO TABS: ORAL_TABLET | ORAL | Status: DC

## 2014-06-12 MED ORDER — SIMVASTATIN 40 MG PO TABS: ORAL_TABLET | ORAL | Status: DC

## 2014-06-12 NOTE — Progress Notes (Signed)
  Subjective:    Patient here for follow-up of elevated blood pressure.  She is exercising and is adherent to a low-salt diet.  Blood pressure is well controlled at home. Cardiac symptoms: none. Patient denies: chest pain, chest pressure/discomfort, claudication, dyspnea, exertional chest pressure/discomfort, fatigue, irregular heart beat, lower extremity edema, near-syncope, orthopnea, palpitations, paroxysmal nocturnal dyspnea, syncope and tachypnea. Cardiovascular risk factors: dyslipidemia, hypertension and obesity (BMI >= 30 kg/m2). Use of agents associated with hypertension: none. History of target organ damage: none.  The following portions of the patient's history were reviewed and updated as appropriate: allergies, current medications, past family history, past medical history, past social history, past surgical history and problem list.  Review of Systems Pertinent items are noted in HPI.     Objective:    BP 114/68  Pulse 73  Temp(Src) 98 F (36.7 C) (Oral)  Wt 176 lb (79.833 kg)  SpO2 97% General appearance: alert, cooperative and appears stated age Lungs: clear to auscultation bilaterally Heart: S1, S2 normal Extremities: extremities normal, atraumatic, no cyanosis or edema    Assessment:    Hypertension, normal blood pressure . Evidence of target organ damage: none.    Plan:    Medication: no change. Dietary sodium restriction. Regular aerobic exercise. Check blood pressures 2-3 times weekly and record. Follow up: 6 months and as needed.   1. Other and unspecified hyperlipidemia Check labs , con't meds - Basic metabolic panel - Hepatic function panel - Lipid panel - POCT urinalysis dipstick  2. Essential hypertension Check labs,  con't meds - Basic metabolic panel - Hepatic function panel - Lipid panel - POCT urinalysis dipstick  3. Obesity, unspecified Discussed diet and exercise

## 2014-06-12 NOTE — Patient Instructions (Signed)

## 2014-06-12 NOTE — Progress Notes (Signed)
Pre visit review using our clinic review tool, if applicable. No additional management support is needed unless otherwise documented below in the visit note. 

## 2014-06-13 ENCOUNTER — Telehealth: Payer: Self-pay | Admitting: Family Medicine

## 2014-06-13 LAB — POCT URINALYSIS DIPSTICK
BILIRUBIN UA: NEGATIVE
Blood, UA: NEGATIVE
Glucose, UA: NEGATIVE
KETONES UA: NEGATIVE
LEUKOCYTES UA: NEGATIVE
NITRITE UA: NEGATIVE
Protein, UA: NEGATIVE
Spec Grav, UA: 1.015
Urobilinogen, UA: 0.2
pH, UA: 6

## 2014-06-13 NOTE — Telephone Encounter (Signed)
Caller name: Richanda Darin Relation to pt: self Call back number: [phone]   Reason for call: pt returning Maudie Mercury call

## 2014-06-13 NOTE — Telephone Encounter (Signed)
Relevant patient education assigned to patient using Emmi. ° °

## 2014-06-16 NOTE — Telephone Encounter (Signed)
Pt returning call to [place].  [phone]

## 2014-06-17 ENCOUNTER — Telehealth: Payer: Self-pay | Admitting: Family Medicine

## 2014-06-17 NOTE — Telephone Encounter (Signed)
Cholesterol--- LDL goal < 100, HDL >40, TG < 150. Diet and exercise will increase HDL and decrease LDL and TG. Fish, Fish Oil, Flaxseed oil will also help increase the HDL and decrease Triglycerides. Recheck labs in 3 months----- zocor not strong inough.---- d/c zocor , lipitor 20 mg #30 1 po qhs, 2 refills 272.4 lipid, hep

## 2014-06-17 NOTE — Telephone Encounter (Signed)
MSG left to call the office      KP 

## 2014-06-17 NOTE — Telephone Encounter (Signed)
Pt

## 2014-06-20 MED ORDER — ATORVASTATIN CALCIUM 20 MG PO TABS: 20.0000 mg | ORAL_TABLET | Freq: Every day | ORAL | Status: DC

## 2014-06-20 NOTE — Telephone Encounter (Signed)
Letter mailed along with Rx.       KP

## 2014-10-06 ENCOUNTER — Other Ambulatory Visit: Payer: Self-pay

## 2014-10-06 DIAGNOSIS — Z1231 Encounter for screening mammogram for malignant neoplasm of breast: Secondary | ICD-10-CM

## 2014-10-09 ENCOUNTER — Other Ambulatory Visit: Payer: Self-pay | Admitting: Family Medicine

## 2014-11-03 ENCOUNTER — Ambulatory Visit
Admission: RE | Admit: 2014-11-03 | Discharge: 2014-11-03 | Disposition: A | Discharge status: Home / Self Care | Payer: BC Managed Care – PPO | Source: Ambulatory Visit

## 2014-11-03 ENCOUNTER — Encounter (INDEPENDENT_AMBULATORY_CARE_PROVIDER_SITE_OTHER): Payer: Self-pay

## 2014-11-03 ENCOUNTER — Other Ambulatory Visit: Payer: Self-pay

## 2014-11-03 DIAGNOSIS — Z1231 Encounter for screening mammogram for malignant neoplasm of breast: Secondary | ICD-10-CM

## 2014-12-02 ENCOUNTER — Other Ambulatory Visit: Payer: Self-pay | Admitting: Family Medicine

## 2015-01-05 ENCOUNTER — Other Ambulatory Visit: Payer: Self-pay

## 2015-01-05 MED ORDER — ATORVASTATIN CALCIUM 20 MG PO TABS: 20.0000 mg | ORAL_TABLET | Freq: Every day | ORAL | Status: DC

## 2015-02-05 ENCOUNTER — Other Ambulatory Visit: Payer: Self-pay | Admitting: Family Medicine

## 2015-02-06 NOTE — Telephone Encounter (Signed)
Atorvastatin denial sent to pharmacy as #90 was sent on 01/05/15. Pt is past due for follow up and will need to be seen before further refills can be given. Please call pt to arrange appt. Thanks!

## 2015-02-06 NOTE — Telephone Encounter (Signed)
Pt scheduled appointment for 02/12/2015

## 2015-02-12 ENCOUNTER — Ambulatory Visit: Payer: Self-pay | Admitting: Family Medicine

## 2015-02-12 ENCOUNTER — Ambulatory Visit (INDEPENDENT_AMBULATORY_CARE_PROVIDER_SITE_OTHER): Payer: 59 | Admitting: Family Medicine

## 2015-02-12 ENCOUNTER — Encounter: Payer: Self-pay | Admitting: Family Medicine

## 2015-02-12 VITALS — Ht 62.0 in | Wt 164.0 lb

## 2015-02-12 DIAGNOSIS — I10 Essential (primary) hypertension: Secondary | ICD-10-CM | POA: Diagnosis not present

## 2015-02-12 DIAGNOSIS — E785 Hyperlipidemia, unspecified: Secondary | ICD-10-CM

## 2015-02-12 DIAGNOSIS — J302 Other seasonal allergic rhinitis: Secondary | ICD-10-CM | POA: Diagnosis not present

## 2015-02-12 MED ORDER — ATORVASTATIN CALCIUM 20 MG PO TABS: 20.0000 mg | ORAL_TABLET | Freq: Every day | ORAL | Status: DC

## 2015-02-12 MED ORDER — FLUTICASONE PROPIONATE 50 MCG/ACT NA SUSP: 2.0000 | Freq: Every day | NASAL | Status: DC

## 2015-02-12 MED ORDER — LISINOPRIL-HYDROCHLOROTHIAZIDE 10-12.5 MG PO TABS: 1.0000 | ORAL_TABLET | Freq: Every day | ORAL | Status: DC

## 2015-02-12 NOTE — Progress Notes (Signed)
Patient ID: Kristen Underwood, female    DOB: 1956-01-31  Age: 59 y.o. MRN: [id number]    Subjective:  Subjective HPI Kristen Underwood presents for f/u cholesterol, htn and allergies.  She is also c/o palpitations with wine only.  It has worsened over the years---she decided to stop wine and it has not occurred since.    Review of Systems  Constitutional: Negative for diaphoresis, activity change, appetite change, fatigue and unexpected weight change.  Eyes: Negative for pain, redness and visual disturbance.  Respiratory: Negative for cough, chest tightness, shortness of breath and wheezing.   Cardiovascular: Positive for palpitations. Negative for chest pain and leg swelling.  Endocrine: Negative for cold intolerance, heat intolerance, polydipsia, polyphagia and polyuria.  Genitourinary: Negative for dysuria, frequency and difficulty urinating.  Neurological: Negative for dizziness, light-headedness, numbness and headaches.  Psychiatric/Behavioral: Negative for behavioral problems and dysphoric mood. The patient is not nervous/anxious.     History Past Medical History  Diagnosis Date  . Elevated cholesterol   . Back pain   . Neck pain   . BRCA negative   . Hypertension     10/09/13:  pt denies  . Migraine     is the reason she takes BP med    She has past surgical history that includes Abdominal hysterectomy; Appendectomy; and Tonsillectomy and adenoidectomy (1966).   Her family history includes Cancer in her father and mother; Diabetes in her mother; Mental illness in her sister.She reports that she has never smoked. She does not have any smokeless tobacco history on file. She reports that she drinks alcohol. She reports that she does not use illicit drugs.  Current Outpatient Prescriptions on File Prior to Visit  Medication Sig Dispense Refill  . cholecalciferol (VITAMIN D) 1000 UNITS tablet Take 1,000 Units by mouth daily.      . Pyridoxine HCl (VITAMIN B-6) 500 MG tablet Take 500 mg  by mouth daily.     No current facility-administered medications on file prior to visit.     Objective:  Objective Physical Exam  Constitutional: She is oriented to person, place, and time. She appears well-developed and well-nourished. No distress.  HENT:  Right Ear: External ear normal.  Left Ear: External ear normal.  Nose: Nose normal.  Mouth/Throat: Oropharynx is clear and moist.  Eyes: EOM are normal. Pupils are equal, round, and reactive to light.  Neck: Normal range of motion. Neck supple.  Cardiovascular: Normal rate, regular rhythm and normal heart sounds.   No murmur heard. Pulmonary/Chest: Effort normal and breath sounds normal. No respiratory distress. She has no wheezes. She has no rales. She exhibits no tenderness.  Neurological: She is alert and oriented to person, place, and time.  Psychiatric: She has a normal mood and affect. Her behavior is normal. Judgment and thought content normal.   Ht 5' 2" (1.575 m)  Wt 164 lb (74.39 kg)  BMI 29.99 kg/m2 Wt Readings from Last 3 Encounters:  02/12/15 164 lb (74.39 kg)  06/12/14 176 lb (79.833 kg)  10/09/13 175 lb (79.379 kg)     Lab Results  Component Value Date   WBC 5.4 10/04/2013   HGB 13.2 10/04/2013   HCT 38.2 10/04/2013   PLT 349.0 10/04/2013   GLUCOSE 95 06/12/2014   CHOL 276* 06/12/2014   TRIG 97.0 06/12/2014   HDL 73.60 06/12/2014   LDLDIRECT 151.8 07/22/2010   LDLCALC 183* 06/12/2014   ALT 26 06/12/2014   AST 20 06/12/2014   NA 135 06/12/2014  K 3.8 06/12/2014   CL 101 06/12/2014   CREATININE 1.0 06/12/2014   BUN 19 06/12/2014   CO2 25 06/12/2014   TSH 1.30 10/04/2013    Mm Screening Breast Tomo Bilateral  11/04/2014   CLINICAL DATA:  Screening.  EXAM: DIGITAL SCREENING BILATERAL MAMMOGRAM WITH 3D TOMO WITH CAD  COMPARISON:  Previous exam(s).  ACR Breast Density Category c: The breast tissue is heterogeneously dense, which may obscure small masses.  FINDINGS: There are no findings suspicious  for malignancy. Images were processed with CAD.  IMPRESSION: No mammographic evidence of malignancy. A result letter of this screening mammogram will be mailed directly to the patient.  RECOMMENDATION: Screening mammogram in one year. (Code:[id number]-B-01Y)  BI-RADS CATEGORY  1: Negative.   Electronically Signed   By: Evangeline Dakin M.D.   On: 11/04/2014 07:38     Assessment & Plan:  Plan I have changed Kristen Underwood's fluticasone. I am also having her maintain her cholecalciferol, vitamin B-6, lisinopril-hydrochlorothiazide, and atorvastatin.  Meds ordered this encounter  Medications  . fluticasone (FLONASE) 50 MCG/ACT nasal spray    Sig: Place 2 sprays into both nostrils daily.    Dispense:  16 g    Refill:  5  . lisinopril-hydrochlorothiazide (PRINZIDE,ZESTORETIC) 10-12.5 MG per tablet    Sig: Take 1 tablet by mouth daily.    Dispense:  90 tablet    Refill:  1  . atorvastatin (LIPITOR) 20 MG tablet    Sig: Take 1 tablet (20 mg total) by mouth daily. Office visit due now    Dispense:  90 tablet    Refill:  0    Problem List Items Addressed This Visit    None    Visit Diagnoses    Seasonal allergies    -  Primary    Relevant Medications    fluticasone (FLONASE) 50 MCG/ACT nasal spray    Essential hypertension        Relevant Medications    lisinopril-hydrochlorothiazide (PRINZIDE,ZESTORETIC) 10-12.5 MG per tablet    atorvastatin (LIPITOR) tablet    Other Relevant Orders    Basic metabolic panel    Hepatic function panel    Lipid panel    Hyperlipidemia        Relevant Medications    lisinopril-hydrochlorothiazide (PRINZIDE,ZESTORETIC) 10-12.5 MG per tablet    atorvastatin (LIPITOR) tablet    Other Relevant Orders    Basic metabolic panel    Hepatic function panel    Lipid panel       Follow-up: Return in about 6 months (around 08/14/2015), or if symptoms worsen or fail to improve, for labs and f/u.  Garnet Koyanagi, DO

## 2015-02-12 NOTE — Patient Instructions (Signed)

## 2015-02-12 NOTE — Progress Notes (Signed)
Pre visit review using our clinic review tool, if applicable. No additional management support is needed unless otherwise documented below in the visit note. 

## 2015-02-13 ENCOUNTER — Other Ambulatory Visit: Payer: Self-pay | Admitting: Family Medicine

## 2015-02-13 LAB — LIPID PANEL
Cholesterol: 196 mg/dL (ref 0–200)
HDL: 77.1 mg/dL (ref >39.00)
LDL CALC: 102 mg/dL — AB (ref 0–99)
NonHDL: 118.9
Total CHOL/HDL Ratio: 3
Triglycerides: 87 mg/dL (ref 0.0–149.0)
VLDL: 17.4 mg/dL (ref 0.0–40.0)

## 2015-02-13 LAB — HEPATIC FUNCTION PANEL
ALT: 23 U/L (ref 0–35)
AST: 21 U/L (ref 0–37)
Albumin: 4.3 g/dL (ref 3.5–5.2)
Alkaline Phosphatase: 69 U/L (ref 39–117)
BILIRUBIN DIRECT: 0.1 mg/dL (ref 0.0–0.3)
Total Bilirubin: 0.4 mg/dL (ref 0.2–1.2)
Total Protein: 7 g/dL (ref 6.0–8.3)

## 2015-02-13 LAB — BASIC METABOLIC PANEL
BUN: 20 mg/dL (ref 6–23)
CO2: 28 meq/L (ref 19–32)
CREATININE: 1.07 mg/dL (ref 0.40–1.20)
Calcium: 9.9 mg/dL (ref 8.4–10.5)
Chloride: 102 mEq/L (ref 96–112)
GFR: 56.22 mL/min — ABNORMAL LOW (ref >60.00)
Glucose, Bld: 86 mg/dL (ref 70–99)
Potassium: 4.4 mEq/L (ref 3.5–5.1)
Sodium: 136 mEq/L (ref 135–145)

## 2015-03-10 ENCOUNTER — Telehealth: Payer: Self-pay | Admitting: Family Medicine

## 2015-03-10 DIAGNOSIS — I10 Essential (primary) hypertension: Secondary | ICD-10-CM

## 2015-03-10 MED ORDER — ATORVASTATIN CALCIUM 20 MG PO TABS: 20.0000 mg | ORAL_TABLET | Freq: Every day | ORAL | Status: DC

## 2015-03-10 MED ORDER — LISINOPRIL-HYDROCHLOROTHIAZIDE 10-12.5 MG PO TABS: 1.0000 | ORAL_TABLET | Freq: Every day | ORAL | Status: DC

## 2015-03-10 NOTE — Telephone Encounter (Signed)
Pt returning your call best # [phone]

## 2015-03-10 NOTE — Telephone Encounter (Signed)
Reviewed labs with the patient and she verbalized understanding, she wanted to get the med's refilled for 30 days and another copy has been mailed.      KP

## 2015-03-10 NOTE — Telephone Encounter (Signed)
Caller name: [place] Relation to pt: self Call back number: [phone] Pharmacy:  Reason for call:   Patient requesting last results.

## 2015-03-10 NOTE — Telephone Encounter (Signed)
MSG Left to call the office     KP 

## 2015-05-15 ENCOUNTER — Other Ambulatory Visit: Payer: Self-pay | Admitting: Family Medicine

## 2015-07-22 ENCOUNTER — Other Ambulatory Visit: Payer: Self-pay | Admitting: Family Medicine

## 2015-08-17 ENCOUNTER — Ambulatory Visit: Payer: 59 | Admitting: Family Medicine

## 2015-08-17 ENCOUNTER — Telehealth: Payer: Self-pay | Admitting: Family Medicine

## 2015-08-17 DIAGNOSIS — Z0289 Encounter for other administrative examinations: Secondary | ICD-10-CM

## 2015-08-17 NOTE — Telephone Encounter (Signed)
yes

## 2015-08-17 NOTE — Telephone Encounter (Signed)
Spouse left voicemail stating patient is out of town patient will call to [facility] - charge or no charge

## 2015-08-21 ENCOUNTER — Other Ambulatory Visit: Payer: Self-pay | Admitting: Family Medicine

## 2015-08-27 ENCOUNTER — Telehealth: Payer: Self-pay | Admitting: Family Medicine

## 2015-08-27 MED ORDER — LISINOPRIL-HYDROCHLOROTHIAZIDE 10-12.5 MG PO TABS: 1.0000 | ORAL_TABLET | Freq: Every day | ORAL | Status: DC

## 2015-08-27 NOTE — Telephone Encounter (Signed)
Relation to pt: self Call back number:[phone] Pharmacy: [id number] - [place], [place] [phone] ([phone] (Fax)         Reason for call:  Patient requesting a refill lisinopril-hydrochlorothiazide (PRINZIDE,ZESTORETIC) 10-12.5 MG per tablet. Patient has a 6 month follow up appointment 09/07/15

## 2015-08-27 NOTE — Telephone Encounter (Signed)
Rx faxed.    KP 

## 2015-09-07 ENCOUNTER — Other Ambulatory Visit: Payer: Self-pay | Admitting: Family Medicine

## 2015-09-07 ENCOUNTER — Ambulatory Visit (HOSPITAL_BASED_OUTPATIENT_CLINIC_OR_DEPARTMENT_OTHER)
Admission: RE | Admit: 2015-09-07 | Discharge: 2015-09-07 | Disposition: A | Discharge status: Home / Self Care | Payer: 59 | Source: Ambulatory Visit | Attending: Family Medicine | Admitting: Family Medicine

## 2015-09-07 ENCOUNTER — Ambulatory Visit (INDEPENDENT_AMBULATORY_CARE_PROVIDER_SITE_OTHER): Payer: 59 | Admitting: Family Medicine

## 2015-09-07 ENCOUNTER — Encounter: Payer: Self-pay | Admitting: Family Medicine

## 2015-09-07 VITALS — BP 128/86 | HR 72 | Temp 98.5°F

## 2015-09-07 DIAGNOSIS — M25561 Pain in right knee: Secondary | ICD-10-CM

## 2015-09-07 DIAGNOSIS — M25562 Pain in left knee: Secondary | ICD-10-CM | POA: Insufficient documentation

## 2015-09-07 DIAGNOSIS — G8929 Other chronic pain: Secondary | ICD-10-CM | POA: Insufficient documentation

## 2015-09-07 DIAGNOSIS — E785 Hyperlipidemia, unspecified: Secondary | ICD-10-CM | POA: Diagnosis not present

## 2015-09-07 DIAGNOSIS — Z23 Encounter for immunization: Secondary | ICD-10-CM | POA: Diagnosis not present

## 2015-09-07 DIAGNOSIS — R1030 Lower abdominal pain, unspecified: Secondary | ICD-10-CM | POA: Diagnosis not present

## 2015-09-07 DIAGNOSIS — R319 Hematuria, unspecified: Secondary | ICD-10-CM

## 2015-09-07 DIAGNOSIS — I10 Essential (primary) hypertension: Secondary | ICD-10-CM

## 2015-09-07 DIAGNOSIS — R8290 Unspecified abnormal findings in urine: Secondary | ICD-10-CM

## 2015-09-07 LAB — POCT URINALYSIS DIPSTICK
BILIRUBIN UA: NEGATIVE
Glucose, UA: NEGATIVE
Ketones, UA: NEGATIVE
LEUKOCYTES UA: NEGATIVE
NITRITE UA: NEGATIVE
Protein, UA: NEGATIVE
SPEC GRAV UA: 1.02
UROBILINOGEN UA: 0.2
pH, UA: 5.5

## 2015-09-07 NOTE — Progress Notes (Signed)
Patient ID: Kristen Underwood, female    DOB: [birth year]  Age: 59 y.o. MRN: [id number]    Subjective:  Subjective HPI Kristen Underwood presents for multiple pains secondary to fall.  She is also here to have labs drawn to f/u lipids, and bp.    Review of Systems  Constitutional: Negative for diaphoresis, appetite change, fatigue and unexpected weight change.  Eyes: Negative for pain, redness and visual disturbance.  Respiratory: Negative for cough, chest tightness, shortness of breath and wheezing.   Cardiovascular: Negative for chest pain, palpitations and leg swelling.  Endocrine: Negative for cold intolerance, heat intolerance, polydipsia, polyphagia and polyuria.  Genitourinary: Negative for dysuria, frequency and difficulty urinating.  Neurological: Negative for dizziness, light-headedness, numbness and headaches.    History Past Medical History  Diagnosis Date  . Elevated cholesterol   . Back pain   . Neck pain   . BRCA negative   . Hypertension     10/09/13:  pt denies  . Migraine     is the reason she takes BP med    She has past surgical history that includes Abdominal hysterectomy; Appendectomy; and Tonsillectomy and adenoidectomy (1966).   Her family history includes Cancer in her father and mother; Diabetes in her mother; Mental illness in her sister.She reports that she has never smoked. She does not have any smokeless tobacco history on file. She reports that she drinks alcohol. She reports that she does not use illicit drugs.  Current Outpatient Prescriptions on File Prior to Visit  Medication Sig Dispense Refill  . atorvastatin (LIPITOR) 20 MG tablet TAKE 1 TABLET (20 MG TOTAL) BY MOUTH DAILY. REPEAT LABS ARE DUE NOW 90 tablet 0  . cholecalciferol (VITAMIN D) 1000 UNITS tablet Take 1,000 Units by mouth daily.      . fluticasone (FLONASE) 50 MCG/ACT nasal spray Place 2 sprays into both nostrils daily. 16 g 5  . lisinopril-hydrochlorothiazide (PRINZIDE,ZESTORETIC) 10-12.5  MG tablet Take 1 tablet by mouth daily. 90 tablet 1  . Pyridoxine HCl (VITAMIN B-6) 500 MG tablet Take 500 mg by mouth daily.     No current facility-administered medications on file prior to visit.     Objective:  Objective Physical Exam  Constitutional: She is oriented to person, place, and time. She appears well-developed and well-nourished.  HENT:  Head: Normocephalic and atraumatic.  Eyes: Conjunctivae and EOM are normal.  Neck: Normal range of motion. Neck supple. No JVD present. Carotid bruit is not present. No thyromegaly present.  Cardiovascular: Normal rate, regular rhythm and normal heart sounds.   No murmur heard. Pulmonary/Chest: Effort normal and breath sounds normal. No respiratory distress. She has no wheezes. She has no rales. She exhibits no tenderness.  Musculoskeletal: She exhibits no edema.  Neurological: She is alert and oriented to person, place, and time.  Psychiatric: She has a normal mood and affect. Her behavior is normal.   BP 128/86 mmHg  Pulse 72  Temp(Src) 98.5 F (36.9 C) (Oral)  Wt   SpO2 98% Wt Readings from Last 3 Encounters:  02/12/15 164 lb (74.39 kg)  06/12/14 176 lb (79.833 kg)  10/09/13 175 lb (79.379 kg)     Lab Results  Component Value Date   WBC 5.4 10/04/2013   HGB 13.2 10/04/2013   HCT 38.2 10/04/2013   PLT 349.0 10/04/2013   GLUCOSE 95 09/07/2015   CHOL 221* 09/07/2015   TRIG 95.0 09/07/2015   HDL 79.60 09/07/2015   LDLDIRECT 151.8 07/22/2010   LDLCALC 122* 09/07/2015  ALT 31 09/07/2015   AST 22 09/07/2015   NA 134* 09/07/2015   K 4.0 09/07/2015   CL 100 09/07/2015   CREATININE 0.91 09/07/2015   BUN 19 09/07/2015   CO2 23 09/07/2015   TSH 1.30 10/04/2013    Mm Screening Breast Tomo Bilateral  11/04/2014  CLINICAL DATA:  Screening. EXAM: DIGITAL SCREENING BILATERAL MAMMOGRAM WITH 3D TOMO WITH CAD COMPARISON:  Previous exam(s). ACR Breast Density Category c: The breast tissue is heterogeneously dense, which may  obscure small masses. FINDINGS: There are no findings suspicious for malignancy. Images were processed with CAD. IMPRESSION: No mammographic evidence of malignancy. A result letter of this screening mammogram will be mailed directly to the patient. RECOMMENDATION: Screening mammogram in one year. (Code:SM-B-01Y) BI-RADS CATEGORY  1: Negative. Electronically Signed   By: Evangeline Dakin M.D.   On: 11/04/2014 07:38     Assessment & Plan:  Plan I am having Kristen Underwood maintain her cholecalciferol, vitamin B-6, fluticasone, atorvastatin, lisinopril-hydrochlorothiazide, and aspirin.  Meds ordered this encounter  Medications  . aspirin 81 MG tablet    Sig: Take 81 mg by mouth daily.    Problem List Items Addressed This Visit    None    Visit Diagnoses    Need for immunization against influenza    -  Primary    Relevant Orders    Flu Vaccine QUAD 36+ mos IM (Fluarix) (Completed)    Lipid panel (Completed)    Hyperlipidemia        Relevant Medications    aspirin 81 MG tablet    Other Relevant Orders    Comp Met (CMET) (Completed)    Lipid panel (Completed)    Right knee pain        Suprapubic pain, unspecified laterality        Relevant Orders    POCT urinalysis dipstick (Completed)    Urine abnormality        Relevant Orders    Urine Culture (Completed)    Hematuria        Relevant Orders    Urine Culture (Completed)       Follow-up: Return in about 6 months (around 03/06/2016), or if symptoms worsen or fail to improve, for annual exam, fasting.  Garnet Koyanagi, DO

## 2015-09-07 NOTE — Progress Notes (Signed)
Pre visit review using our clinic review tool, if applicable. No additional management support is needed unless otherwise documented below in the visit note. 

## 2015-09-07 NOTE — Patient Instructions (Signed)

## 2015-09-08 LAB — COMPREHENSIVE METABOLIC PANEL
ALBUMIN: 4.5 g/dL (ref 3.5–5.2)
ALK PHOS: 75 U/L (ref 39–117)
ALT: 31 U/L (ref 0–35)
AST: 22 U/L (ref 0–37)
BUN: 19 mg/dL (ref 6–23)
CALCIUM: 10.4 mg/dL (ref 8.4–10.5)
CO2: 23 mEq/L (ref 19–32)
Chloride: 100 mEq/L (ref 96–112)
Creatinine, Ser: 0.91 mg/dL (ref 0.40–1.20)
GFR: 67.64 mL/min (ref >60.00)
Glucose, Bld: 95 mg/dL (ref 70–99)
POTASSIUM: 4 meq/L (ref 3.5–5.1)
SODIUM: 134 meq/L — AB (ref 135–145)
TOTAL PROTEIN: 7.4 g/dL (ref 6.0–8.3)
Total Bilirubin: 0.6 mg/dL (ref 0.2–1.2)

## 2015-09-08 LAB — URINE CULTURE
COLONY COUNT: NO GROWTH
ORGANISM ID, BACTERIA: NO GROWTH

## 2015-09-08 LAB — LIPID PANEL
CHOLESTEROL: 221 mg/dL — AB (ref 0–200)
HDL: 79.6 mg/dL (ref >39.00)
LDL Cholesterol: 122 mg/dL — ABNORMAL HIGH (ref 0–99)
NonHDL: 141.41
TRIGLYCERIDES: 95 mg/dL (ref 0.0–149.0)
Total CHOL/HDL Ratio: 3
VLDL: 19 mg/dL (ref 0.0–40.0)

## 2015-09-13 DIAGNOSIS — I10 Essential (primary) hypertension: Secondary | ICD-10-CM | POA: Insufficient documentation

## 2015-09-13 NOTE — Assessment & Plan Note (Signed)
con't lisinopril  bp stable

## 2015-09-13 NOTE — Assessment & Plan Note (Signed)
Cont lipitor. Check labs.

## 2015-09-16 ENCOUNTER — Other Ambulatory Visit: Payer: Self-pay | Admitting: Family Medicine

## 2015-09-16 ENCOUNTER — Encounter: Payer: Self-pay | Admitting: Family Medicine

## 2015-09-16 DIAGNOSIS — I10 Essential (primary) hypertension: Secondary | ICD-10-CM

## 2015-09-16 MED ORDER — LISINOPRIL-HYDROCHLOROTHIAZIDE 20-12.5 MG PO TABS: 1.0000 | ORAL_TABLET | Freq: Every day | ORAL | Status: DC

## 2015-10-13 ENCOUNTER — Telehealth: Payer: Self-pay | Admitting: Family Medicine

## 2015-10-13 NOTE — Telephone Encounter (Signed)
Pt dropped off paperwork to be filled out ([employer])

## 2015-10-14 NOTE — Telephone Encounter (Signed)
LM for pt to call and schedule 30 min OV for surgical clearance

## 2015-10-14 NOTE — Telephone Encounter (Signed)
Please call pt and schedule a surgical clearance appt with Dr. Etter Sjogren. Thanks, Janett Billow.

## 2015-10-27 ENCOUNTER — Other Ambulatory Visit: Payer: Self-pay | Admitting: Family Medicine

## 2015-11-27 ENCOUNTER — Other Ambulatory Visit: Payer: Self-pay | Admitting: Family Medicine

## 2015-12-05 ENCOUNTER — Other Ambulatory Visit: Payer: Self-pay | Admitting: Family Medicine

## 2016-03-06 ENCOUNTER — Other Ambulatory Visit: Payer: Self-pay | Admitting: Family Medicine

## 2016-04-03 ENCOUNTER — Other Ambulatory Visit: Payer: Self-pay | Admitting: Family Medicine

## 2016-04-07 ENCOUNTER — Telehealth: Payer: Self-pay | Admitting: Family Medicine

## 2016-04-07 MED ORDER — ATORVASTATIN CALCIUM 20 MG PO TABS: ORAL_TABLET | ORAL | Status: DC

## 2016-04-07 NOTE — Telephone Encounter (Signed)
Rx faxed.    KP 

## 2016-04-07 NOTE — Telephone Encounter (Signed)
Caller name: Self   Can be reached: [phone]  Pharmacy:  [id number] - [place], [place] [address] [phone] (Phone) [phone] (Fax)         Reason for call: Request refill on atorvastatin (LIPITOR) 20 MG tablet to last until her appointment on 6/15

## 2016-04-21 ENCOUNTER — Ambulatory Visit: Payer: 59 | Admitting: Family Medicine

## 2016-04-26 ENCOUNTER — Ambulatory Visit (INDEPENDENT_AMBULATORY_CARE_PROVIDER_SITE_OTHER): Payer: 59 | Admitting: Family Medicine

## 2016-04-26 ENCOUNTER — Encounter: Payer: Self-pay | Admitting: Family Medicine

## 2016-04-26 ENCOUNTER — Other Ambulatory Visit (INDEPENDENT_AMBULATORY_CARE_PROVIDER_SITE_OTHER): Payer: 59

## 2016-04-26 VITALS — BP 108/64 | HR 93 | Temp 98.4°F | Ht 62.0 in | Wt 185.2 lb

## 2016-04-26 DIAGNOSIS — E785 Hyperlipidemia, unspecified: Secondary | ICD-10-CM

## 2016-04-26 DIAGNOSIS — I10 Essential (primary) hypertension: Secondary | ICD-10-CM | POA: Diagnosis not present

## 2016-04-26 LAB — COMPREHENSIVE METABOLIC PANEL
ALK PHOS: 69 U/L (ref 39–117)
ALT: 38 U/L — ABNORMAL HIGH (ref 0–35)
AST: 27 U/L (ref 0–37)
Albumin: 4.5 g/dL (ref 3.5–5.2)
BILIRUBIN TOTAL: 0.6 mg/dL (ref 0.2–1.2)
BUN: 23 mg/dL (ref 6–23)
CHLORIDE: 102 meq/L (ref 96–112)
CO2: 28 mEq/L (ref 19–32)
Calcium: 10.3 mg/dL (ref 8.4–10.5)
Creatinine, Ser: 1.11 mg/dL (ref 0.40–1.20)
GFR: 53.66 mL/min — ABNORMAL LOW (ref >60.00)
Glucose, Bld: 106 mg/dL — ABNORMAL HIGH (ref 70–99)
POTASSIUM: 4.9 meq/L (ref 3.5–5.1)
SODIUM: 136 meq/L (ref 135–145)
Total Protein: 7.5 g/dL (ref 6.0–8.3)

## 2016-04-26 LAB — LIPID PANEL
Cholesterol: 211 mg/dL — ABNORMAL HIGH (ref 0–200)
HDL: 77.7 mg/dL (ref >39.00)
LDL CALC: 110 mg/dL — AB (ref 0–99)
NONHDL: 133.12
Total CHOL/HDL Ratio: 3
Triglycerides: 117 mg/dL (ref 0.0–149.0)
VLDL: 23.4 mg/dL (ref 0.0–40.0)

## 2016-04-26 MED ORDER — LISINOPRIL-HYDROCHLOROTHIAZIDE 20-12.5 MG PO TABS: 1.0000 | ORAL_TABLET | Freq: Every day | ORAL | Status: DC

## 2016-04-26 MED ORDER — ATORVASTATIN CALCIUM 20 MG PO TABS: ORAL_TABLET | ORAL | Status: DC

## 2016-04-26 NOTE — Progress Notes (Signed)
Pre visit review using our clinic review tool, if applicable. No additional management support is needed unless otherwise documented below in the visit note. 

## 2016-04-26 NOTE — Progress Notes (Signed)
Patient ID: Kristen Underwood, female    DOB: 01/03/1956  Age: 59 y.o. MRN: [id number]    Subjective:  Subjective HPI Kristen Underwood presents for f/u bp and cholesterol    Review of Systems  Constitutional: Negative for diaphoresis, appetite change, fatigue and unexpected weight change.  Eyes: Negative for pain, redness and visual disturbance.  Respiratory: Negative for cough, chest tightness, shortness of breath and wheezing.   Cardiovascular: Negative for chest pain, palpitations and leg swelling.  Endocrine: Negative for cold intolerance, heat intolerance, polydipsia, polyphagia and polyuria.  Genitourinary: Negative for dysuria, frequency and difficulty urinating.  Neurological: Negative for dizziness, light-headedness, numbness and headaches.    History Past Medical History  Diagnosis Date  . Elevated cholesterol   . Back pain   . Neck pain   . BRCA negative   . Hypertension     10/09/13:  pt denies  . Migraine     is the reason she takes BP med    She has past surgical history that includes Abdominal hysterectomy; Appendectomy; and Tonsillectomy and adenoidectomy (1966).   Her family history includes Cancer in her father and mother; Diabetes in her mother; Mental illness in her sister.She reports that she has never smoked. She does not have any smokeless tobacco history on file. She reports that she drinks alcohol. She reports that she does not use illicit drugs.  Current Outpatient Prescriptions on File Prior to Visit  Medication Sig Dispense Refill  . aspirin 81 MG tablet Take 81 mg by mouth daily.    . cholecalciferol (VITAMIN D) 1000 UNITS tablet Take 1,000 Units by mouth daily.      . fluticasone (FLONASE) 50 MCG/ACT nasal spray Place 2 sprays into both nostrils daily. 16 g 5  . Pyridoxine HCl (VITAMIN B-6) 500 MG tablet Take 500 mg by mouth daily.     No current facility-administered medications on file prior to visit.     Objective:  Objective Physical Exam    Constitutional: She is oriented to person, place, and time. She appears well-developed and well-nourished.  HENT:  Head: Normocephalic and atraumatic.  Eyes: Conjunctivae and EOM are normal.  Neck: Normal range of motion. Neck supple. No JVD present. Carotid bruit is not present. No thyromegaly present.  Cardiovascular: Normal rate, regular rhythm and normal heart sounds.   No murmur heard. Pulmonary/Chest: Effort normal and breath sounds normal. No respiratory distress. She has no wheezes. She has no rales. She exhibits no tenderness.  Musculoskeletal: She exhibits no edema.  Neurological: She is alert and oriented to person, place, and time.  Psychiatric: She has a normal mood and affect. Her behavior is normal. Judgment and thought content normal.  Nursing note and vitals reviewed.  BP 108/64 mmHg  Pulse 93  Temp(Src) 98.4 F (36.9 C) (Oral)  Ht 5' 2"  (1.575 m)  Wt 185 lb 3.2 oz (84.006 kg)  BMI 33.86 kg/m2  SpO2 98% Wt Readings from Last 3 Encounters:  04/26/16 185 lb 3.2 oz (84.006 kg)  02/12/15 164 lb (74.39 kg)  06/12/14 176 lb (79.833 kg)     Lab Results  Component Value Date   WBC 5.4 10/04/2013   HGB 13.2 10/04/2013   HCT 38.2 10/04/2013   PLT 349.0 10/04/2013   GLUCOSE 106* 04/26/2016   CHOL 211* 04/26/2016   TRIG 117.0 04/26/2016   HDL 77.70 04/26/2016   LDLDIRECT 151.8 07/22/2010   LDLCALC 110* 04/26/2016   ALT 38* 04/26/2016   AST 27 04/26/2016   NA  136 04/26/2016   K 4.9 04/26/2016   CL 102 04/26/2016   CREATININE 1.11 04/26/2016   BUN 23 04/26/2016   CO2 28 04/26/2016   TSH 1.30 10/04/2013    Dg Knee Complete 4 Views Left  09/07/2015  CLINICAL DATA:  Chronic pain after fall 4 months prior EXAM: LEFT KNEE - COMPLETE 4+ VIEW COMPARISON:  None. FINDINGS: Frontal, tunnel, lateral, and sunrise patellar images were obtained. There is no fracture or dislocation. No appreciable joint effusion. Joint spaces appear intact. No erosive change. IMPRESSION: No  fracture or joint effusion.  No appreciable arthropathy. Electronically Signed   By: Lowella Grip III M.D.   On: 09/07/2015 16:02     Assessment & Plan:  Plan I am having Kristen Underwood maintain her cholecalciferol, vitamin B-6, fluticasone, aspirin, lisinopril-hydrochlorothiazide, and atorvastatin.  Meds ordered this encounter  Medications  . lisinopril-hydrochlorothiazide (ZESTORETIC) 20-12.5 MG tablet    Sig: Take 1 tablet by mouth daily.    Dispense:  30 tablet    Refill:  5  . atorvastatin (LIPITOR) 20 MG tablet    Sig: TAKE 1 TABLET BY MOUTH DAILY    Dispense:  30 tablet    Refill:  5    Problem List Items Addressed This Visit    HTN (hypertension)   Relevant Medications   lisinopril-hydrochlorothiazide (ZESTORETIC) 20-12.5 MG tablet   atorvastatin (LIPITOR) 20 MG tablet   Hyperlipidemia - Primary   Relevant Medications   lisinopril-hydrochlorothiazide (ZESTORETIC) 20-12.5 MG tablet   atorvastatin (LIPITOR) 20 MG tablet   Other Relevant Orders   Lipid panel (Completed)   Comprehensive metabolic panel (Completed)      Follow-up: Return in about 6 months (around 10/26/2016), or if symptoms worsen or fail to improve.  Ann Held, DO

## 2016-04-26 NOTE — Assessment & Plan Note (Signed)
Stable con't meds 

## 2016-04-26 NOTE — Patient Instructions (Signed)
Hypertension Hypertension, commonly called high blood pressure, is when the force of blood pumping through your arteries is too strong. Your arteries are the blood vessels that carry blood from your heart throughout your body. A blood pressure reading consists of a higher number over a lower number, such as 110/72. The higher number (systolic) is the pressure inside your arteries when your heart pumps. The lower number (diastolic) is the pressure inside your arteries when your heart relaxes. Ideally you want your blood pressure below 120/80. Hypertension forces your heart to work harder to pump blood. Your arteries may become narrow or stiff. Having untreated or uncontrolled hypertension can cause heart attack, stroke, kidney disease, and other problems. RISK FACTORS Some risk factors for high blood pressure are controllable. Others are not.  Risk factors you cannot control include:   Race. You may be at higher risk if you are African American.  Age. Risk increases with age.  Gender. Men are at higher risk than women before age 45 years. After age 65, women are at higher risk than men. Risk factors you can control include:  Not getting enough exercise or physical activity.  Being overweight.  Getting too much fat, sugar, calories, or salt in your diet.  Drinking too much alcohol. SIGNS AND SYMPTOMS Hypertension does not usually cause signs or symptoms. Extremely high blood pressure (hypertensive crisis) may cause headache, anxiety, shortness of breath, and nosebleed. DIAGNOSIS To check if you have hypertension, your health care provider will measure your blood pressure while you are seated, with your arm held at the level of your heart. It should be measured at least twice using the same arm. Certain conditions can cause a difference in blood pressure between your right and left arms. A blood pressure reading that is higher than normal on one occasion does not mean that you need treatment. If  it is not clear whether you have high blood pressure, you may be asked to return on a different day to have your blood pressure checked again. Or, you may be asked to monitor your blood pressure at home for 1 or more weeks. TREATMENT Treating high blood pressure includes making lifestyle changes and possibly taking medicine. Living a healthy lifestyle can help lower high blood pressure. You may need to change some of your habits. Lifestyle changes may include:  Following the DASH diet. This diet is high in fruits, vegetables, and whole grains. It is low in salt, red meat, and added sugars.  Keep your sodium intake below 2,300 mg per day.  Getting at least 30-45 minutes of aerobic exercise at least 4 times per week.  Losing weight if necessary.  Not smoking.  Limiting alcoholic beverages.  Learning ways to reduce stress. Your health care provider may prescribe medicine if lifestyle changes are not enough to get your blood pressure under control, and if one of the following is true:  You are 18-59 years of age and your systolic blood pressure is above 140.  You are 60 years of age or older, and your systolic blood pressure is above 150.  Your diastolic blood pressure is above 90.  You have diabetes, and your systolic blood pressure is over 140 or your diastolic blood pressure is over 90.  You have kidney disease and your blood pressure is above 140/90.  You have heart disease and your blood pressure is above 140/90. Your personal target blood pressure may vary depending on your medical conditions, your age, and other factors. HOME CARE INSTRUCTIONS    Have your blood pressure rechecked as directed by your health care provider.   Take medicines only as directed by your health care provider. Follow the directions carefully. Blood pressure medicines must be taken as prescribed. The medicine does not work as well when you skip doses. Skipping doses also puts you at risk for  problems.  Do not smoke.   Monitor your blood pressure at home as directed by your health care provider. SEEK MEDICAL CARE IF:   You think you are having a reaction to medicines taken.  You have recurrent headaches or feel dizzy.  You have swelling in your ankles.  You have trouble with your vision. SEEK IMMEDIATE MEDICAL CARE IF:  You develop a severe headache or confusion.  You have unusual weakness, numbness, or feel faint.  You have severe chest or abdominal pain.  You vomit repeatedly.  You have trouble breathing. MAKE SURE YOU:   Understand these instructions.  Will watch your condition.  Will get help right away if you are not doing well or get worse.   This information is not intended to replace advice given to you by your health care provider. Make sure you discuss any questions you have with your health care provider.   Document Released: 10/24/2005 Document Revised: 03/10/2015 Document Reviewed: 08/16/2013 Elsevier Interactive Patient Education 2016 [employer] Inc.  

## 2016-07-27 ENCOUNTER — Other Ambulatory Visit: Payer: Self-pay | Admitting: Family Medicine

## 2016-07-27 DIAGNOSIS — Z1231 Encounter for screening mammogram for malignant neoplasm of breast: Secondary | ICD-10-CM

## 2016-08-17 ENCOUNTER — Ambulatory Visit
Admission: RE | Admit: 2016-08-17 | Discharge: 2016-08-17 | Disposition: A | Discharge status: Home / Self Care | Payer: 59 | Source: Ambulatory Visit | Attending: Family Medicine | Admitting: Family Medicine

## 2016-08-17 DIAGNOSIS — Z1231 Encounter for screening mammogram for malignant neoplasm of breast: Secondary | ICD-10-CM

## 2016-10-19 ENCOUNTER — Other Ambulatory Visit: Payer: Self-pay | Admitting: Family Medicine

## 2016-10-19 DIAGNOSIS — I10 Essential (primary) hypertension: Secondary | ICD-10-CM

## 2016-11-24 ENCOUNTER — Encounter: Payer: Self-pay | Admitting: Family Medicine

## 2016-11-25 ENCOUNTER — Other Ambulatory Visit: Payer: Self-pay | Admitting: Family Medicine

## 2016-11-25 DIAGNOSIS — I10 Essential (primary) hypertension: Secondary | ICD-10-CM

## 2016-11-25 DIAGNOSIS — E785 Hyperlipidemia, unspecified: Secondary | ICD-10-CM

## 2016-11-25 MED ORDER — LISINOPRIL-HYDROCHLOROTHIAZIDE 20-12.5 MG PO TABS: 1.0000 | ORAL_TABLET | Freq: Every day | ORAL | 0 refills | Status: DC

## 2016-11-25 MED ORDER — ATORVASTATIN CALCIUM 20 MG PO TABS: ORAL_TABLET | ORAL | 0 refills | Status: DC

## 2016-12-22 ENCOUNTER — Other Ambulatory Visit: Payer: Self-pay | Admitting: Family Medicine

## 2016-12-22 DIAGNOSIS — I10 Essential (primary) hypertension: Secondary | ICD-10-CM

## 2016-12-26 ENCOUNTER — Encounter: Payer: Self-pay | Admitting: Family Medicine

## 2016-12-26 NOTE — Telephone Encounter (Signed)
If she would like to post pone I'm ok with it-- just now sure how long it will be

## 2016-12-27 ENCOUNTER — Ambulatory Visit: Payer: Self-pay | Admitting: Family Medicine

## 2017-01-22 ENCOUNTER — Other Ambulatory Visit: Payer: Self-pay | Admitting: Family Medicine

## 2017-01-22 DIAGNOSIS — I10 Essential (primary) hypertension: Secondary | ICD-10-CM

## 2017-01-26 ENCOUNTER — Ambulatory Visit (INDEPENDENT_AMBULATORY_CARE_PROVIDER_SITE_OTHER): Payer: 59 | Admitting: Family Medicine

## 2017-01-26 ENCOUNTER — Ambulatory Visit (HOSPITAL_BASED_OUTPATIENT_CLINIC_OR_DEPARTMENT_OTHER)
Admission: RE | Admit: 2017-01-26 | Discharge: 2017-01-26 | Disposition: A | Discharge status: Home / Self Care | Payer: 59 | Source: Ambulatory Visit | Attending: Family Medicine | Admitting: Family Medicine

## 2017-01-26 ENCOUNTER — Encounter: Payer: Self-pay | Admitting: Family Medicine

## 2017-01-26 VITALS — BP 112/68 | HR 73 | Temp 98.0°F | Resp 16 | Ht 62.0 in | Wt 188.0 lb

## 2017-01-26 DIAGNOSIS — E785 Hyperlipidemia, unspecified: Secondary | ICD-10-CM

## 2017-01-26 DIAGNOSIS — J9801 Acute bronchospasm: Secondary | ICD-10-CM | POA: Diagnosis not present

## 2017-01-26 DIAGNOSIS — I10 Essential (primary) hypertension: Secondary | ICD-10-CM | POA: Diagnosis not present

## 2017-01-26 DIAGNOSIS — R05 Cough: Secondary | ICD-10-CM | POA: Diagnosis not present

## 2017-01-26 DIAGNOSIS — E782 Mixed hyperlipidemia: Secondary | ICD-10-CM

## 2017-01-26 DIAGNOSIS — R059 Cough, unspecified: Secondary | ICD-10-CM

## 2017-01-26 LAB — COMPREHENSIVE METABOLIC PANEL
ALT: 26 U/L (ref 0–35)
AST: 22 U/L (ref 0–37)
Albumin: 4.5 g/dL (ref 3.5–5.2)
Alkaline Phosphatase: 73 U/L (ref 39–117)
BILIRUBIN TOTAL: 0.4 mg/dL (ref 0.2–1.2)
BUN: 21 mg/dL (ref 6–23)
CALCIUM: 10.2 mg/dL (ref 8.4–10.5)
CHLORIDE: 104 meq/L (ref 96–112)
CO2: 27 meq/L (ref 19–32)
Creatinine, Ser: 1.04 mg/dL (ref 0.40–1.20)
GFR: 57.7 mL/min — AB (ref >60.00)
Glucose, Bld: 99 mg/dL (ref 70–99)
Potassium: 4.9 mEq/L (ref 3.5–5.1)
Sodium: 136 mEq/L (ref 135–145)
Total Protein: 7.2 g/dL (ref 6.0–8.3)

## 2017-01-26 LAB — LIPID PANEL
CHOL/HDL RATIO: 3
Cholesterol: 242 mg/dL — ABNORMAL HIGH (ref 0–200)
HDL: 75.8 mg/dL (ref >39.00)
LDL CALC: 145 mg/dL — AB (ref 0–99)
NonHDL: 166.25
TRIGLYCERIDES: 107 mg/dL (ref 0.0–149.0)
VLDL: 21.4 mg/dL (ref 0.0–40.0)

## 2017-01-26 MED ORDER — LISINOPRIL-HYDROCHLOROTHIAZIDE 20-12.5 MG PO TABS: 1.0000 | ORAL_TABLET | Freq: Every day | ORAL | 1 refills | Status: DC

## 2017-01-26 MED ORDER — ATORVASTATIN CALCIUM 20 MG PO TABS: ORAL_TABLET | ORAL | 1 refills | Status: DC

## 2017-01-26 MED ORDER — BECLOMETHASONE DIPROPIONATE 40 MCG/ACT IN AERS: 2.0000 | INHALATION_SPRAY | Freq: Two times a day (BID) | RESPIRATORY_TRACT | 12 refills | Status: DC

## 2017-01-26 MED ORDER — ALBUTEROL SULFATE 108 (90 BASE) MCG/ACT IN AEPB: 2.0000 | INHALATION_SPRAY | Freq: Four times a day (QID) | RESPIRATORY_TRACT | 2 refills | Status: DC | PRN

## 2017-01-26 NOTE — Patient Instructions (Signed)
Bronchospasm, Adult Bronchospasm is a tightening of the airways going into the lungs. During an episode, it may be harder to breathe. You may cough, and you may make a whistling sound when you breathe (wheeze). This condition often affects people with asthma. What are the causes? This condition is caused by swelling and irritation in the airways. It can be triggered by:  An infection (common).  Seasonal allergies.  An allergic reaction.  Exercise.  Irritants. These include pollution, cigarette smoke, strong odors, aerosol sprays, and paint fumes.  Weather changes. Winds increase molds and pollens in the air. Cold air may cause swelling.  Stress and emotional upset.  What are the signs or symptoms? Symptoms of this condition include:  Wheezing. If the episode was triggered by an allergy, wheezing may start right away or hours later.  Nighttime coughing.  Frequent or severe coughing with a simple cold.  Chest tightness.  Shortness of breath.  Decreased ability to exercise.  How is this diagnosed? This condition is usually diagnosed with a review of your medical history and a physical exam. Tests, such as lung function tests, are sometimes done to look for other conditions. The need for a chest X-ray depends on where the wheezing occurs and whether it is the first time you have wheezed. How is this treated? This condition may be treated with:  Inhaled medicines. These open up the airways and help you breathe. They can be taken with an inhaler or a nebulizer device.  Corticosteroid medicines. These may be given for severe bronchospasm, usually when it is associated with asthma.  Avoiding triggers, such as irritants, infection, or allergies.  Follow these instructions at home: Medicines  Take over-the-counter and prescription medicines only as told by your health care provider.  If you need to use an inhaler or nebulizer to take your medicine, ask your health care  provider to explain how to use it correctly. If you were given a spacer, always use it with your inhaler. Lifestyle  Reduce the number of triggers in your home. To do this: ? Change your heating and air conditioning filter at least once a month. ? Limit your use of fireplaces and wood stoves. ? Do not smoke. Do not allow smoking in your home. ? Avoid using perfumes and fragrances. ? Get rid of pests, such as roaches and mice, and their droppings. ? Remove any mold from your home. ? Keep your house clean and dust free. Use unscented cleaning products. ? Replace carpet with wood, tile, or vinyl flooring. Carpet can trap dander and dust. ? Use allergy-proof pillows, mattress covers, and box spring covers. ? Wash bed sheets and blankets every week in hot water. Dry them in a dryer. ? Use blankets that are made of polyester or cotton. ? Wash your hands often. ? Do not allow pets in your bedroom.  Avoid breathing in cold air when you exercise. General instructions  Have a plan for seeking medical care. Know when to call your health care provider and local emergency services, and where to get emergency care.  Stay up to date on your immunizations.  When you have an episode of bronchospasm, stay calm. Try to relax and breathe more slowly.  If you have asthma, make sure you have an asthma action plan.  Keep all follow-up visits as told by your health care provider. This is important. Contact a health care provider if:  You have muscle aches.  You have chest pain.  The mucus that you   cough up (sputum) changes from clear or white to yellow, green, gray, or bloody.  You have a fever.  Your sputum gets thicker. Get help right away if:  Your wheezing and coughing get worse, even after you take your prescribed medicines.  It gets even harder to breathe.  You develop severe chest pain. Summary  Bronchospasm is a tightening of the airways going into the lungs.  During an episode of  bronchospasm, you may have a harder time breathing. You may cough and make a whistling sound when you breathe (wheeze).  Avoid exposure to triggers such as smoke, dust, mold, animal dander, and fragrances.  When you have an episode of bronchospasm, stay calm. Try to relax and breathe more slowly. This information is not intended to replace advice given to you by your health care provider. Make sure you discuss any questions you have with your health care provider. Document Released: 10/27/2003 Document Revised: 10/20/2016 Document Reviewed: 10/20/2016 Elsevier Interactive Patient Education  2017 [employer]  

## 2017-01-26 NOTE — Progress Notes (Signed)
Pre visit review using our clinic review tool, if applicable. No additional management support is needed unless otherwise documented below in the visit note. 

## 2017-01-26 NOTE — Progress Notes (Signed)
Subjective:  I acted as a Education administrator for Dr. Lennox Pippins, LPN    Patient ID: Kristen Underwood, female    DOB: 1956/08/06, 61 y.o.   MRN: [id number]  Chief Complaint  Patient presents with  . Medication Refill    Medication Refill  Associated symptoms include congestion, coughing, myalgias, nausea, neck pain and numbness. Pertinent negatives include no chest pain, fever, headaches, rash or vomiting. She has tried nothing for the symptoms.    Patient is in today for medication refill  Patient Care Team: Ann Held, DO as PCP - General   Past Medical History:  Diagnosis Date  . Back pain   . BRCA negative   . Elevated cholesterol   . Hypertension    10/09/13:  pt denies  . Migraine    is the reason she takes BP med  . Neck pain     Past Surgical History:  Procedure Laterality Date  . ABDOMINAL HYSTERECTOMY    . APPENDECTOMY    . TONSILLECTOMY AND ADENOIDECTOMY  1966    Family History  Problem Relation Age of Onset  . Cancer Mother     BREAST.....AUNTS, GRANDMOTHER, Christy Sartorius  . Diabetes Mother   . Cancer Father     PROSTATE  . Mental illness Sister     had urosepsis--  and had schizo breakdown    Social History   Social History  . Marital status: Married    Spouse name: N/A  . Number of children: 3  . Years of education: N/A   Occupational History  . PSYCHOLOGIST [employer]   Social History Main Topics  . Smoking status: Never Smoker  . Smokeless tobacco: Never Used  . Alcohol use Yes     Comment: RARE,SOCIAL  . Drug use: No  . Sexual activity: Yes    Partners: Male   Other Topics Concern  . Not on file   Social History Narrative   Exercise--not lately    Outpatient Medications Prior to Visit  Medication Sig Dispense Refill  . aspirin 81 MG tablet Take 81 mg by mouth daily.    . cholecalciferol (VITAMIN D) 1000 UNITS tablet Take 1,000 Units by mouth daily.      . fluticasone (FLONASE) 50 MCG/ACT nasal spray Place 2  sprays into both nostrils daily. 16 g 5  . Pyridoxine HCl (VITAMIN B-6) 500 MG tablet Take 500 mg by mouth daily.    Marland Kitchen atorvastatin (LIPITOR) 20 MG tablet TAKE 1 TABLET BY MOUTH DAILY 30 tablet 0  . lisinopril-hydrochlorothiazide (PRINZIDE,ZESTORETIC) 20-12.5 MG tablet TAKE 1 TABLET BY MOUTH DAILY. 30 tablet 0   No facility-administered medications prior to visit.     No Known Allergies  Review of Systems  Constitutional: Negative for fever.  HENT: Positive for congestion.   Eyes: Negative for blurred vision.  Respiratory: Positive for cough.   Cardiovascular: Negative for chest pain and palpitations.  Gastrointestinal: Positive for nausea. Negative for vomiting.  Musculoskeletal: Positive for myalgias and neck pain. Negative for back pain.  Skin: Negative for rash.  Neurological: Positive for numbness. Negative for loss of consciousness and headaches.       Objective:    Physical Exam  Constitutional: She is oriented to person, place, and time. She appears well-developed and well-nourished. No distress.  HENT:  Head: Normocephalic and atraumatic.  Eyes: Conjunctivae are normal. Pupils are equal, round, and reactive to light.  Neck: Normal range of motion. No thyromegaly present.  Cardiovascular: Normal rate and  regular rhythm.   Pulmonary/Chest: Effort normal. She has decreased breath sounds. She has no wheezes.  Abdominal: Soft. Bowel sounds are normal. There is no tenderness.  Musculoskeletal: Normal range of motion. She exhibits no edema or deformity.  Neurological: She is alert and oriented to person, place, and time.  Skin: Skin is warm and dry. She is not diaphoretic.  Psychiatric: She has a normal mood and affect.    BP 112/68 (BP Location: Left Arm, Patient Position: Sitting, Cuff Size: Large)   Pulse 73   Temp 98 F (36.7 C) (Oral)   Resp 16   Ht 5' 2"  (1.575 m)   Wt 188 lb (85.3 kg)   SpO2 98%   BMI 34.39 kg/m  Wt Readings from Last 3 Encounters:    01/26/17 188 lb (85.3 kg)  04/26/16 185 lb 3.2 oz (84 kg)  02/12/15 164 lb (74.4 kg)   BP Readings from Last 3 Encounters:  01/26/17 112/68  04/26/16 108/64  09/07/15 128/86     Immunization History  Administered Date(s) Administered  . Influenza Whole 10/14/2009, 08/02/2010  . Influenza,inj,Quad PF,36+ Mos 10/04/2013, 09/07/2015  . Td 10/20/2004    Health Maintenance  Topic Date Due  . Hepatitis C Screening  08/03/1956  . HIV Screening  04/29/1973  . TETANUS/TDAP  10/20/2014  . INFLUENZA VACCINE  06/07/2016  . PAP SMEAR  10/04/2016  . COLONOSCOPY  10/23/2017  . MAMMOGRAM  08/17/2018    Lab Results  Component Value Date   WBC 5.4 10/04/2013   HGB 13.2 10/04/2013   HCT 38.2 10/04/2013   PLT 349.0 10/04/2013   GLUCOSE 99 01/26/2017   CHOL 242 (H) 01/26/2017   TRIG 107.0 01/26/2017   HDL 75.80 01/26/2017   LDLDIRECT 151.8 07/22/2010   LDLCALC 145 (H) 01/26/2017   ALT 26 01/26/2017   AST 22 01/26/2017   NA 136 01/26/2017   K 4.9 01/26/2017   CL 104 01/26/2017   CREATININE 1.04 01/26/2017   BUN 21 01/26/2017   CO2 27 01/26/2017   TSH 1.30 10/04/2013    Lab Results  Component Value Date   TSH 1.30 10/04/2013   Lab Results  Component Value Date   WBC 5.4 10/04/2013   HGB 13.2 10/04/2013   HCT 38.2 10/04/2013   MCV 86.7 10/04/2013   PLT 349.0 10/04/2013   Lab Results  Component Value Date   NA 136 01/26/2017   K 4.9 01/26/2017   CO2 27 01/26/2017   GLUCOSE 99 01/26/2017   BUN 21 01/26/2017   CREATININE 1.04 01/26/2017   BILITOT 0.4 01/26/2017   ALKPHOS 73 01/26/2017   AST 22 01/26/2017   ALT 26 01/26/2017   PROT 7.2 01/26/2017   ALBUMIN 4.5 01/26/2017   CALCIUM 10.2 01/26/2017   GFR 57.70 (L) 01/26/2017   Lab Results  Component Value Date   CHOL 242 (H) 01/26/2017   Lab Results  Component Value Date   HDL 75.80 01/26/2017   Lab Results  Component Value Date   LDLCALC 145 (H) 01/26/2017   Lab Results  Component Value Date   TRIG  107.0 01/26/2017   Lab Results  Component Value Date   CHOLHDL 3 01/26/2017   No results found for: HGBA1C       Assessment & Plan:   Problem List Items Addressed This Visit      Unprioritized   Hyperlipidemia   Relevant Medications   atorvastatin (LIPITOR) 20 MG tablet   lisinopril-hydrochlorothiazide (PRINZIDE,ZESTORETIC) 20-12.5 MG tablet  Other Relevant Orders   Comprehensive metabolic panel (Completed)   Lipid panel (Completed)   HTN (hypertension) - Primary   Relevant Medications   atorvastatin (LIPITOR) 20 MG tablet   lisinopril-hydrochlorothiazide (PRINZIDE,ZESTORETIC) 20-12.5 MG tablet   Other Relevant Orders   Comprehensive metabolic panel (Completed)   Lipid panel (Completed)    Other Visit Diagnoses    Bronchospasm       Relevant Medications   beclomethasone (QVAR) 40 MCG/ACT inhaler   Albuterol Sulfate (PROAIR RESPICLICK) 790 (90 Base) MCG/ACT AEPB   Cough       Relevant Orders   DG Chest 2 View (Completed)      I am having Ms. Cahalan start on beclomethasone and Albuterol Sulfate. I am also having her maintain her cholecalciferol, vitamin B-6, fluticasone, aspirin, atorvastatin, and lisinopril-hydrochlorothiazide.  Meds ordered this encounter  Medications  . beclomethasone (QVAR) 40 MCG/ACT inhaler    Sig: Inhale 2 puffs into the lungs 2 (two) times daily.    Dispense:  1 Inhaler    Refill:  12  . Albuterol Sulfate (PROAIR RESPICLICK) 383 (90 Base) MCG/ACT AEPB    Sig: Inhale 2 puffs into the lungs 4 (four) times daily as needed.    Dispense:  1 each    Refill:  2  . atorvastatin (LIPITOR) 20 MG tablet    Sig: TAKE 1 TABLET BY MOUTH DAILY    Dispense:  90 tablet    Refill:  1  . lisinopril-hydrochlorothiazide (PRINZIDE,ZESTORETIC) 20-12.5 MG tablet    Sig: Take 1 tablet by mouth daily.    Dispense:  90 tablet    Refill:  1    CMA served as Education administrator during this visit. History, Physical and Plan performed by medical provider. Documentation  and orders reviewed and attested to.  Ann Held, DO   Patient ID: Kristen Underwood, female   DOB: 1956/09/09, 61 y.o.   MRN: [id number]

## 2017-01-28 ENCOUNTER — Other Ambulatory Visit: Payer: Self-pay | Admitting: Family Medicine

## 2017-01-28 DIAGNOSIS — E785 Hyperlipidemia, unspecified: Secondary | ICD-10-CM

## 2017-01-30 ENCOUNTER — Other Ambulatory Visit: Payer: Self-pay | Admitting: Family Medicine

## 2017-01-30 DIAGNOSIS — E785 Hyperlipidemia, unspecified: Secondary | ICD-10-CM

## 2017-01-31 ENCOUNTER — Telehealth: Payer: Self-pay | Admitting: Family Medicine

## 2017-01-31 ENCOUNTER — Ambulatory Visit: Payer: 59

## 2017-01-31 NOTE — Telephone Encounter (Signed)
No charge. 

## 2017-01-31 NOTE — Telephone Encounter (Signed)
Patient went upstairs thinking she had an appointment with Dr. Elsworth Soho, please waive no show appointment., charge or no charge

## 2017-02-04 ENCOUNTER — Encounter: Payer: Self-pay | Admitting: Family Medicine

## 2017-02-14 ENCOUNTER — Ambulatory Visit (INDEPENDENT_AMBULATORY_CARE_PROVIDER_SITE_OTHER): Payer: 59

## 2017-02-14 DIAGNOSIS — R05 Cough: Secondary | ICD-10-CM | POA: Diagnosis not present

## 2017-02-14 DIAGNOSIS — R059 Cough, unspecified: Secondary | ICD-10-CM

## 2017-02-14 NOTE — Progress Notes (Signed)
Normal  Ann Held, DO

## 2017-02-16 ENCOUNTER — Institutional Professional Consult (permissible substitution): Payer: 59 | Admitting: Pulmonary Disease

## 2017-04-27 ENCOUNTER — Encounter: Payer: Self-pay | Admitting: Family Medicine

## 2017-05-02 ENCOUNTER — Other Ambulatory Visit: Payer: 59

## 2017-06-13 ENCOUNTER — Other Ambulatory Visit (INDEPENDENT_AMBULATORY_CARE_PROVIDER_SITE_OTHER): Payer: 59

## 2017-06-13 DIAGNOSIS — E785 Hyperlipidemia, unspecified: Secondary | ICD-10-CM

## 2017-06-13 LAB — COMPREHENSIVE METABOLIC PANEL
ALBUMIN: 4.3 g/dL (ref 3.5–5.2)
ALK PHOS: 54 U/L (ref 39–117)
ALT: 23 U/L (ref 0–35)
AST: 21 U/L (ref 0–37)
BILIRUBIN TOTAL: 0.4 mg/dL (ref 0.2–1.2)
BUN: 22 mg/dL (ref 6–23)
CO2: 26 mEq/L (ref 19–32)
CREATININE: 1.15 mg/dL (ref 0.40–1.20)
Calcium: 9.5 mg/dL (ref 8.4–10.5)
Chloride: 104 mEq/L (ref 96–112)
GFR: 51.31 mL/min — ABNORMAL LOW (ref >60.00)
GLUCOSE: 106 mg/dL — AB (ref 70–99)
Potassium: 4.7 mEq/L (ref 3.5–5.1)
SODIUM: 137 meq/L (ref 135–145)
TOTAL PROTEIN: 6.9 g/dL (ref 6.0–8.3)

## 2017-06-13 LAB — LIPID PANEL
CHOLESTEROL: 189 mg/dL (ref 0–200)
HDL: 75.5 mg/dL (ref >39.00)
LDL Cholesterol: 92 mg/dL (ref 0–99)
NONHDL: 113.55
Total CHOL/HDL Ratio: 3
Triglycerides: 106 mg/dL (ref 0.0–149.0)
VLDL: 21.2 mg/dL (ref 0.0–40.0)

## 2017-06-22 ENCOUNTER — Ambulatory Visit (INDEPENDENT_AMBULATORY_CARE_PROVIDER_SITE_OTHER): Payer: 59 | Admitting: Family Medicine

## 2017-06-22 ENCOUNTER — Encounter: Payer: Self-pay | Admitting: Family Medicine

## 2017-06-22 ENCOUNTER — Ambulatory Visit (HOSPITAL_BASED_OUTPATIENT_CLINIC_OR_DEPARTMENT_OTHER)
Admission: RE | Admit: 2017-06-22 | Discharge: 2017-06-22 | Disposition: A | Discharge status: Home / Self Care | Payer: 59 | Source: Ambulatory Visit | Attending: Family Medicine | Admitting: Family Medicine

## 2017-06-22 VITALS — BP 118/72 | HR 97 | Temp 97.9°F | Ht 62.5 in | Wt 188.0 lb

## 2017-06-22 DIAGNOSIS — R0789 Other chest pain: Secondary | ICD-10-CM

## 2017-06-22 DIAGNOSIS — Z8701 Personal history of pneumonia (recurrent): Secondary | ICD-10-CM

## 2017-06-22 DIAGNOSIS — I10 Essential (primary) hypertension: Secondary | ICD-10-CM | POA: Diagnosis not present

## 2017-06-22 DIAGNOSIS — E782 Mixed hyperlipidemia: Secondary | ICD-10-CM

## 2017-06-22 DIAGNOSIS — F4329 Adjustment disorder with other symptoms: Secondary | ICD-10-CM | POA: Diagnosis not present

## 2017-06-22 DIAGNOSIS — E785 Hyperlipidemia, unspecified: Secondary | ICD-10-CM

## 2017-06-22 DIAGNOSIS — R079 Chest pain, unspecified: Secondary | ICD-10-CM | POA: Insufficient documentation

## 2017-06-22 MED ORDER — ATORVASTATIN CALCIUM 20 MG PO TABS: ORAL_TABLET | ORAL | 1 refills | Status: DC

## 2017-06-22 NOTE — Progress Notes (Signed)
Pre visit review using our clinic review tool, if applicable. No additional management support is needed unless otherwise documented below in the visit note. 

## 2017-06-22 NOTE — Patient Instructions (Signed)
Stress and Stress Management Stress is a normal reaction to life events. It is what you feel when life demands more than you are used to or more than you can handle. Some stress can be useful. For example, the stress reaction can help you catch the last bus of the day, study for a test, or meet a deadline at work. But stress that occurs too often or for too long can cause problems. It can affect your emotional health and interfere with relationships and normal daily activities. Too much stress can weaken your immune system and increase your risk for physical illness. If you already have a medical problem, stress can make it worse. What are the causes? All sorts of life events may cause stress. An event that causes stress for one person may not be stressful for another person. Major life events commonly cause stress. These may be positive or negative. Examples include losing your job, moving into a new home, getting married, having a baby, or losing a loved one. Less obvious life events may also cause stress, especially if they occur day after day or in combination. Examples include working long hours, driving in traffic, caring for children, being in debt, or being in a difficult relationship. What are the signs or symptoms? Stress may cause emotional symptoms including, the following:  Anxiety. This is feeling worried, afraid, on edge, overwhelmed, or out of control.  Anger. This is feeling irritated or impatient.  Depression. This is feeling sad, down, helpless, or guilty.  Difficulty focusing, remembering, or making decisions.  Stress may cause physical symptoms, including the following:  Aches and pains. These may affect your head, neck, back, stomach, or other areas of your body.  Tight muscles or clenched jaw.  Low energy or trouble sleeping.  Stress may cause unhealthy behaviors, including the following:  Eating to feel better (overeating) or skipping meals.  Sleeping too little,  too much, or both.  Working too much or putting off tasks (procrastination).  Smoking, drinking alcohol, or using drugs to feel better.  How is this diagnosed? Stress is diagnosed through an assessment by your health care provider. Your health care provider will ask questions about your symptoms and any stressful life events.Your health care provider will also ask about your medical history and may order blood tests or other tests. Certain medical conditions and medicine can cause physical symptoms similar to stress. Mental illness can cause emotional symptoms and unhealthy behaviors similar to stress. Your health care provider may refer you to a mental health professional for further evaluation. How is this treated? Stress management is the recommended treatment for stress.The goals of stress management are reducing stressful life events and coping with stress in healthy ways. Techniques for reducing stressful life events include the following:  Stress identification. Self-monitor for stress and identify what causes stress for you. These skills may help you to avoid some stressful events.  Time management. Set your priorities, keep a calendar of events, and learn to say "no." These tools can help you avoid making too many commitments.  Techniques for coping with stress include the following:  Rethinking the problem. Try to think realistically about stressful events rather than ignoring them or overreacting. Try to find the positives in a stressful situation rather than focusing on the negatives.  Exercise. Physical exercise can release both physical and emotional tension. The key is to find a form of exercise you enjoy and do it regularly.  Relaxation techniques. These relax the body and  mind. Examples include yoga, meditation, tai chi, biofeedback, deep breathing, progressive muscle relaxation, listening to music, being out in nature, journaling, and other hobbies. Again, the key is to find  one or more that you enjoy and can do regularly.  Healthy lifestyle. Eat a balanced diet, get plenty of sleep, and do not smoke. Avoid using alcohol or drugs to relax.  Strong support network. Spend time with family, friends, or other people you enjoy being around.Express your feelings and talk things over with someone you trust.  Counseling or talktherapy with a mental health professional may be helpful if you are having difficulty managing stress on your own. Medicine is typically not recommended for the treatment of stress.Talk to your health care provider if you think you need medicine for symptoms of stress. Follow these instructions at home:  Keep all follow-up visits as directed by your health care provider.  Take all medicines as directed by your health care provider. Contact a health care provider if:  Your symptoms get worse or you start having new symptoms.  You feel overwhelmed by your problems and can no longer manage them on your own. Get help right away if:  You feel like hurting yourself or someone else. This information is not intended to replace advice given to you by your health care provider. Make sure you discuss any questions you have with your health care provider. Document Released: 04/19/2001 Document Revised: 03/31/2016 Document Reviewed: 06/18/2013 Elsevier Interactive Patient Education  2017 [employer]  

## 2017-06-22 NOTE — Assessment & Plan Note (Signed)
ekg nsr  

## 2017-06-22 NOTE — Assessment & Plan Note (Signed)
Well controlled, no changes to meds. Encouraged heart healthy diet such as the DASH diet and exercise as tolerated.  °
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
## 2017-06-22 NOTE — Assessment & Plan Note (Signed)
Tolerating statin, encouraged heart healthy diet, avoid trans fats, minimize simple carbs and saturated fats. Increase exercise as tolerated 

## 2017-07-17 ENCOUNTER — Other Ambulatory Visit: Payer: Self-pay

## 2017-07-17 ENCOUNTER — Other Ambulatory Visit: Payer: Self-pay | Admitting: Family Medicine

## 2017-07-17 DIAGNOSIS — I10 Essential (primary) hypertension: Secondary | ICD-10-CM

## 2017-07-17 DIAGNOSIS — E782 Mixed hyperlipidemia: Secondary | ICD-10-CM

## 2017-07-17 MED ORDER — LISINOPRIL-HYDROCHLOROTHIAZIDE 20-12.5 MG PO TABS: 1.0000 | ORAL_TABLET | Freq: Every day | ORAL | 0 refills | Status: DC

## 2017-07-20 ENCOUNTER — Ambulatory Visit: Payer: 59 | Admitting: Family Medicine

## 2017-08-15 ENCOUNTER — Encounter: Payer: Self-pay | Admitting: Family Medicine

## 2017-08-15 ENCOUNTER — Ambulatory Visit (INDEPENDENT_AMBULATORY_CARE_PROVIDER_SITE_OTHER): Payer: 59 | Admitting: Family Medicine

## 2017-08-15 VITALS — BP 100/74 | HR 87 | Temp 97.9°F | Resp 16 | Ht 62.5 in

## 2017-08-15 DIAGNOSIS — Z8701 Personal history of pneumonia (recurrent): Secondary | ICD-10-CM | POA: Insufficient documentation

## 2017-08-15 DIAGNOSIS — Z23 Encounter for immunization: Secondary | ICD-10-CM

## 2017-08-15 DIAGNOSIS — I10 Essential (primary) hypertension: Secondary | ICD-10-CM | POA: Diagnosis not present

## 2017-08-15 DIAGNOSIS — E782 Mixed hyperlipidemia: Secondary | ICD-10-CM | POA: Diagnosis not present

## 2017-08-15 DIAGNOSIS — E785 Hyperlipidemia, unspecified: Secondary | ICD-10-CM | POA: Diagnosis not present

## 2017-08-15 MED ORDER — LISINOPRIL-HYDROCHLOROTHIAZIDE 20-12.5 MG PO TABS: 1.0000 | ORAL_TABLET | Freq: Every day | ORAL | 1 refills | Status: DC

## 2017-08-15 MED ORDER — ATORVASTATIN CALCIUM 20 MG PO TABS: ORAL_TABLET | ORAL | 1 refills | Status: DC

## 2017-08-15 NOTE — Assessment & Plan Note (Signed)
Well controlled, no changes to meds. Encouraged heart healthy diet such as the DASH diet and exercise as tolerated.  °

## 2017-08-15 NOTE — Patient Instructions (Signed)

## 2017-08-15 NOTE — Progress Notes (Signed)
Patient ID: Kristen Underwood, female    DOB: [birth year]  Age: 61 y.o. MRN: [id number]    Subjective:  Subjective  HPI Kristen Underwood presents for bp and chol check .  No complaints  Review of Systems  Constitutional: Negative for chills and fever.  HENT: Negative for congestion and hearing loss.   Eyes: Negative for discharge.  Respiratory: Negative for cough and shortness of breath.   Cardiovascular: Negative for chest pain, palpitations and leg swelling.  Gastrointestinal: Negative for abdominal pain, blood in stool, constipation, diarrhea, nausea and vomiting.  Genitourinary: Negative for dysuria, frequency, hematuria and urgency.  Musculoskeletal: Negative for back pain and myalgias.  Skin: Negative for rash.  Allergic/Immunologic: Negative for environmental allergies.  Neurological: Negative for dizziness, weakness and headaches.  Hematological: Does not bruise/bleed easily.  Psychiatric/Behavioral: Negative for suicidal ideas. The patient is not nervous/anxious.     History Past Medical History:  Diagnosis Date  . Back pain   . BRCA negative   . Elevated cholesterol   . Hypertension    10/09/13:  pt denies  . Migraine    is the reason she takes BP med  . Neck pain     She has a past surgical history that includes Abdominal hysterectomy; Appendectomy; and Tonsillectomy and adenoidectomy (1966).   Her family history includes Cancer in her father and mother; Diabetes in her mother; Mental illness in her sister.She reports that she has never smoked. She has never used smokeless tobacco. She reports that she drinks alcohol. She reports that she does not use drugs.  Current Outpatient Prescriptions on File Prior to Visit  Medication Sig Dispense Refill  . aspirin 81 MG tablet Take 81 mg by mouth daily.    . cholecalciferol (VITAMIN D) 1000 UNITS tablet Take 1,000 Units by mouth daily.      . fluticasone (FLONASE) 50 MCG/ACT nasal spray Place 2 sprays into both nostrils daily. 16  g 5  . Pyridoxine HCl (VITAMIN B-6) 500 MG tablet Take 500 mg by mouth daily.     No current facility-administered medications on file prior to visit.      Objective:  Objective  Physical Exam  Constitutional: She is oriented to person, place, and time. She appears well-developed and well-nourished.  HENT:  Head: Normocephalic and atraumatic.  Eyes: Conjunctivae and EOM are normal.  Neck: Normal range of motion. Neck supple. No JVD present. Carotid bruit is not present. No thyromegaly present.  Cardiovascular: Normal rate, regular rhythm and normal heart sounds.   No murmur heard. Pulmonary/Chest: Effort normal and breath sounds normal. No respiratory distress. She has no wheezes. She has no rales. She exhibits no tenderness.  Musculoskeletal: She exhibits no edema.  Neurological: She is alert and oriented to person, place, and time.  Psychiatric: She has a normal mood and affect.  Nursing note and vitals reviewed.  BP 100/74 (BP Location: Left Arm, Patient Position: Sitting, Cuff Size: Normal)   Pulse 87   Temp 97.9 F (36.6 C) (Oral)   Resp 16   Ht 5' 2.5" (1.588 m)   SpO2 98%  Wt Readings from Last 3 Encounters:  06/22/17 188 lb (85.3 kg)  01/26/17 188 lb (85.3 kg)  04/26/16 185 lb 3.2 oz (84 kg)     Lab Results  Component Value Date   WBC 5.4 10/04/2013   HGB 13.2 10/04/2013   HCT 38.2 10/04/2013   PLT 349.0 10/04/2013   GLUCOSE 106 (H) 06/13/2017   CHOL 189 06/13/2017  TRIG 106.0 06/13/2017   HDL 75.50 06/13/2017   LDLDIRECT 151.8 07/22/2010   LDLCALC 92 06/13/2017   ALT 23 06/13/2017   AST 21 06/13/2017   NA 137 06/13/2017   K 4.7 06/13/2017   CL 104 06/13/2017   CREATININE 1.15 06/13/2017   BUN 22 06/13/2017   CO2 26 06/13/2017   TSH 1.30 10/04/2013    Dg Chest 2 View  Result Date: 06/22/2017 CLINICAL DATA:  Fatigue, cough, chest pain, was diagnosed with pneumonia in [place], no improvement, history hypertension EXAM: CHEST  2 VIEW COMPARISON:   01/24/2017 FINDINGS: Normal heart size, mediastinal contours, and pulmonary vascularity. Lungs clear. No pleural effusion or pneumothorax. Bones demineralized. IMPRESSION: No acute abnormalities. Electronically Signed   By: Lavonia Dana M.D.   On: 06/22/2017 12:31     Assessment & Plan:  Plan  I am having Ms. Koebel maintain her cholecalciferol, vitamin B-6, fluticasone, aspirin, lisinopril-hydrochlorothiazide, and atorvastatin.  Meds ordered this encounter  Medications  . lisinopril-hydrochlorothiazide (PRINZIDE,ZESTORETIC) 20-12.5 MG tablet    Sig: Take 1 tablet by mouth daily.    Dispense:  90 tablet    Refill:  1  . atorvastatin (LIPITOR) 20 MG tablet    Sig: TAKE 1 TABLET BY MOUTH DAILY    Dispense:  90 tablet    Refill:  1    Problem List Items Addressed This Visit      Unprioritized   Essential hypertension    Well controlled, no changes to meds. Encouraged heart healthy diet such as the DASH diet and exercise as tolerated.        Relevant Medications   lisinopril-hydrochlorothiazide (PRINZIDE,ZESTORETIC) 20-12.5 MG tablet   atorvastatin (LIPITOR) 20 MG tablet   History of pneumonia   Hyperlipidemia    Tolerating statin, encouraged heart healthy diet, avoid trans fats, minimize simple carbs and saturated fats. Increase exercise as tolerated      Relevant Medications   lisinopril-hydrochlorothiazide (PRINZIDE,ZESTORETIC) 20-12.5 MG tablet   atorvastatin (LIPITOR) 20 MG tablet    Other Visit Diagnoses    Need for pneumococcal vaccination    -  Primary   Relevant Orders   Pneumococcal polysaccharide vaccine 23-valent greater than or equal to 2yo subcutaneous/IM (Completed)   Need for immunization against influenza       Relevant Orders   Flu Vaccine QUAD 36+ mos IM (Completed)      Follow-up: Return in about 6 months (around 02/13/2018), or if symptoms worsen or fail to improve.  Ann Held, DO

## 2017-08-15 NOTE — Assessment & Plan Note (Signed)
Tolerating statin, encouraged heart healthy diet, avoid trans fats, minimize simple carbs and saturated fats. Increase exercise as tolerated 

## 2017-10-25 ENCOUNTER — Encounter: Payer: Self-pay | Admitting: Family Medicine

## 2017-11-09 ENCOUNTER — Encounter (INDEPENDENT_AMBULATORY_CARE_PROVIDER_SITE_OTHER): Payer: Self-pay

## 2017-11-20 ENCOUNTER — Ambulatory Visit (INDEPENDENT_AMBULATORY_CARE_PROVIDER_SITE_OTHER): Payer: 59 | Admitting: Family Medicine

## 2017-11-20 ENCOUNTER — Encounter (INDEPENDENT_AMBULATORY_CARE_PROVIDER_SITE_OTHER): Payer: Self-pay | Admitting: Family Medicine

## 2017-11-20 VITALS — BP 109/71 | HR 64 | Temp 97.8°F | Ht 62.0 in | Wt 181.0 lb

## 2017-11-20 DIAGNOSIS — Z6833 Body mass index (BMI) 33.0-33.9, adult: Secondary | ICD-10-CM | POA: Diagnosis not present

## 2017-11-20 DIAGNOSIS — E559 Vitamin D deficiency, unspecified: Secondary | ICD-10-CM | POA: Diagnosis not present

## 2017-11-20 DIAGNOSIS — R5383 Other fatigue: Secondary | ICD-10-CM | POA: Insufficient documentation

## 2017-11-20 DIAGNOSIS — Z0289 Encounter for other administrative examinations: Secondary | ICD-10-CM

## 2017-11-20 DIAGNOSIS — E7849 Other hyperlipidemia: Secondary | ICD-10-CM

## 2017-11-20 DIAGNOSIS — Z1331 Encounter for screening for depression: Secondary | ICD-10-CM

## 2017-11-20 DIAGNOSIS — E669 Obesity, unspecified: Secondary | ICD-10-CM | POA: Diagnosis not present

## 2017-11-20 DIAGNOSIS — I10 Essential (primary) hypertension: Secondary | ICD-10-CM | POA: Diagnosis not present

## 2017-11-20 DIAGNOSIS — R739 Hyperglycemia, unspecified: Secondary | ICD-10-CM | POA: Diagnosis not present

## 2017-11-20 DIAGNOSIS — R0602 Shortness of breath: Secondary | ICD-10-CM | POA: Diagnosis not present

## 2017-11-20 NOTE — Progress Notes (Signed)
.  Office: [phone]  /  Fax: [phone]   HPI:   Chief Complaint: OBESITY  Kristen Underwood (MR# [id number]) is a 62 y.o. female who presents on 11/20/2017 for obesity evaluation and treatment. Current BMI is Body mass index is 33.11 kg/m.Kristen Underwood has struggled with obesity for years and has been unsuccessful in either losing weight or maintaining long term weight loss. Kristen Underwood attended our information session and states she is currently in the action stage of change and ready to dedicate time achieving and maintaining a healthier weight.  Kristen Underwood states her family eats meals together she struggles with family and or coworkers weight loss sabotage her desired weight loss is 40 lbs she started gaining weight after the birth of her second child her heaviest weight ever was 181 lbs. she has significant food cravings issues  she skips meals frequently she is frequently drinking liquids with calories she frequently makes poor food choices she struggles with emotional eating    Fatigue Kristen Underwood feels her energy is lower than it should be. This has worsened with weight gain and has not worsened recently. Kristen Underwood denies daytime somnolence and  admits to waking up still tired. Patient is at risk for obstructive sleep apnea. Patent has a history of symptoms of snoring with weight gain. Patient generally gets 6 or 7 hours of sleep per night, and states they generally have generally restful sleep. Snoring is present. Apneic episodes are not present. Epworth Sleepiness Score is 2  Dyspnea on exertion Kristen Underwood notes increasing shortness of breath with exercising and seems to be worsening over time with weight gain. She notes getting out of breath sooner with activity than she used to. This has not gotten worse recently. Kristen Underwood denies orthopnea.  Depression Screen Kristen Underwood Food and Mood (modified PHQ-9) score was  Depression screen PHQ 2/9 11/20/2017  Decreased Interest 1  Down, Depressed, Hopeless 1  PHQ - 2 Score 2    Altered sleeping 0  Tired, decreased energy 2  Change in appetite 3  Feeling bad or failure about yourself  0  Trouble concentrating 0  Moving slowly or fidgety/restless 0  Suicidal thoughts 0  PHQ-9 Score 7  Difficult doing work/chores Not difficult at all    ALLERGIES: No Known Allergies  MEDICATIONS: Current Outpatient Medications on File Prior to Visit  Medication Sig Dispense Refill  . aspirin 81 MG tablet Take 81 mg by mouth daily.    Marland Kitchen atorvastatin (LIPITOR) 20 MG tablet TAKE 1 TABLET BY MOUTH DAILY 90 tablet 1  . cholecalciferol (VITAMIN D) 1000 UNITS tablet Take 1,000 Units by mouth daily.      . fluticasone (FLONASE) 50 MCG/ACT nasal spray Place 2 sprays into both nostrils daily. 16 g 5  . lisinopril-hydrochlorothiazide (PRINZIDE,ZESTORETIC) 20-12.5 MG tablet Take 1 tablet by mouth daily. 90 tablet 1  . Omega-3 Fatty Acids (FISH OIL) 1000 MG CAPS Take by mouth daily.    . Pyridoxine HCl (VITAMIN B-6) 500 MG tablet Take 500 mg by mouth daily.     No current facility-administered medications on file prior to visit.     PAST MEDICAL HISTORY: Past Medical History:  Diagnosis Date  . Back pain   . BRCA negative   . Cough, persistent   . Elevated cholesterol   . GERD (gastroesophageal reflux disease)   . Hyperlipidemia   . Hypertension    10/09/13:  pt denies  . Migraine    is the reason she takes BP med  . Neck pain  PAST SURGICAL HISTORY: Past Surgical History:  Procedure Laterality Date  . ABDOMINAL HYSTERECTOMY    . APPENDECTOMY    . TONSILLECTOMY AND ADENOIDECTOMY  1966    SOCIAL HISTORY: Social History   Tobacco Use  . Smoking status: Never Smoker  . Smokeless tobacco: Never Used  Substance Use Topics  . Alcohol use: Yes    Comment: RARE,SOCIAL  . Drug use: No    FAMILY HISTORY: Family History  Problem Relation Age of Onset  . Cancer Mother        BREAST.....AUNTS, GRANDMOTHER, Christy Sartorius  . Diabetes Mother   . Hyperlipidemia  Mother   . Stroke Mother   . Depression Mother   . Obesity Mother   . Cancer Father        PROSTATE  . Hypertension Father   . Mental illness Sister        had urosepsis--  and had schizo breakdown    ROS: Review of Systems  HENT: Positive for congestion.   Respiratory: Positive for cough.   Gastrointestinal: Positive for diarrhea.  All other systems reviewed and are negative.   PHYSICAL EXAM: Blood pressure 109/71, pulse 64, temperature 97.8 F (36.6 C), temperature source Oral, height 5' 2"  (1.575 m), weight 181 lb (82.1 kg), SpO2 98 %. Body mass index is 33.11 kg/m. Physical Exam  Constitutional: She is oriented to person, place, and time. She appears well-developed and well-nourished.  HENT:  Head: Normocephalic and atraumatic.  Nose: Nose normal.  Eyes: EOM are normal. No scleral icterus.  Neck: Normal range of motion. Neck supple. No thyromegaly present.  Cardiovascular: Normal rate and regular rhythm.  Pulmonary/Chest: Effort normal and breath sounds normal. No respiratory distress.  Abdominal: Soft. There is no tenderness.  + obesity  Musculoskeletal:  Range of Motion normal in all 4 extremities Trace edema noted in bilateral lower extremities  Neurological: She is alert and oriented to person, place, and time. Coordination normal.  Skin: Skin is warm and dry.  Psychiatric: She has a normal mood and affect. Her behavior is normal.  Vitals reviewed.   RECENT LABS AND TESTS: BMET    Component Value Date/Time   NA 137 06/13/2017 0820   K 4.7 06/13/2017 0820   CL 104 06/13/2017 0820   CO2 26 06/13/2017 0820   GLUCOSE 106 (H) 06/13/2017 0820   BUN 22 06/13/2017 0820   CREATININE 1.15 06/13/2017 0820   CALCIUM 9.5 06/13/2017 0820   GFRNONAA 78.85 07/22/2010 0904   GFRAA 98 08/13/2008 0900   No results found for: HGBA1C No results found for: INSULIN CBC    Component Value Date/Time   WBC 5.4 10/04/2013 1123   RBC 4.40 10/04/2013 1123   HGB 13.2  10/04/2013 1123   HCT 38.2 10/04/2013 1123   PLT 349.0 10/04/2013 1123   MCV 86.7 10/04/2013 1123   MCHC 34.6 10/04/2013 1123   RDW 13.4 10/04/2013 1123   LYMPHSABS 1.4 10/04/2013 1123   MONOABS 0.3 10/04/2013 1123   EOSABS 0.3 10/04/2013 1123   BASOSABS 0.0 10/04/2013 1123   Iron/TIBC/Ferritin/ %Sat No results found for: IRON, TIBC, FERRITIN, IRONPCTSAT Lipid Panel     Component Value Date/Time   CHOL 189 06/13/2017 0820   TRIG 106.0 06/13/2017 0820   HDL 75.50 06/13/2017 0820   CHOLHDL 3 06/13/2017 0820   VLDL 21.2 06/13/2017 0820   LDLCALC 92 06/13/2017 0820   LDLDIRECT 151.8 07/22/2010 0904   Hepatic Function Panel     Component Value Date/Time  PROT 6.9 06/13/2017 0820   ALBUMIN 4.3 06/13/2017 0820   AST 21 06/13/2017 0820   ALT 23 06/13/2017 0820   ALKPHOS 54 06/13/2017 0820   BILITOT 0.4 06/13/2017 0820   BILIDIR 0.1 02/12/2015 1511   IBILI 0.4 09/29/2010 2010      Component Value Date/Time   TSH 1.30 10/04/2013 1123    ECG  shows NSR with a rate of 66 INDIRECT CALORIMETER done today shows a VO2 of 140 and a REE of 975 which is lower than expected. Her calculated basal metabolic rate is 2993 thus her basal metabolic rate is worse than expected.    ASSESSMENT AND PLAN: Other fatigue - Plan: EKG 12-Lead, Vitamin B12, CBC With Differential, Comprehensive metabolic panel, Folate, T3, T4, free, TSH  Shortness of breath on exertion - Plan: CBC With Differential  Essential hypertension  Other hyperlipidemia - Plan: Lipid Panel With LDL/HDL Ratio  Vitamin D deficiency - Plan: VITAMIN D 25 Hydroxy (Vit-D Deficiency, Fractures)  Hyperglycemia - Plan: Comprehensive metabolic panel, Hemoglobin A1c, Insulin, random  Depression screening  Class 1 obesity with serious comorbidity and body mass index (BMI) of 33.0 to 33.9 in adult, unspecified obesity type  PLAN:  Fatigue Blayke was informed that her fatigue may be related to obesity, depression or many other  causes. Labs will be ordered, and in the meanwhile Enyla has agreed to work on diet, exercise and weight loss to help with fatigue. Proper sleep hygiene was discussed including the need for 7-8 hours of quality sleep each night. A sleep study was not ordered based on symptoms and Epworth score.  Dyspnea on exertion Marrissa's shortness of breath appears to be obesity related and exercise induced. She has agreed to work on weight loss and gradually increase exercise to treat her exercise induced shortness of breath. If Kristen Underwood follows our instructions and loses weight without improvement of her shortness of breath, we will plan to refer to pulmonology. We will monitor this condition regularly. Jeannett agrees to this plan.  Depression Screen Ghada had a mildly positive depression screening. Depression is commonly associated with obesity and often results in emotional eating behaviors. We will monitor this closely and work on CBT to help improve the non-hunger eating patterns. Referral to Psychology may be required if no improvement is seen as she continues in our clinic.   Obesity Graycee is currently in the action stage of change and her goal is to continue with weight loss efforts She has agreed to follow the Category 1 plan Arora has been instructed to work up to a goal of 150 minutes of combined cardio and strengthening exercise per week for weight loss and overall health benefits. We discussed the following Behavioral Modification Stratagies today: increasing lean protein intake, decreasing simple carbohydrates  and work on meal planning and easy cooking plans  Griselle has agreed to follow up with our clinic in 2 weeks. She was informed of the importance of frequent follow up visits to maximize her success with intensive lifestyle modifications for her multiple health conditions. She was informed we would discuss her lab results at her next visit unless there is a critical issue that needs to be addressed sooner.  Kristen Underwood agreed to keep her next visit at the agreed upon time to discuss these results.    OBESITY BEHAVIORAL INTERVENTION VISIT  Today's visit was # 1 out of 22.  Starting weight: 181 Starting date: 11/20/17 Today's weight : Weight: 181 lb (82.1 kg)  Today's date: 11/20/2017 Total  lbs lost to date: 0 (Patients must lose 7 lbs in the first 6 months to continue with counseling)   ASK: We discussed the diagnosis of obesity with [name] today and Kristen Underwood agreed to give [name] permission to discuss obesity behavioral modification therapy today.  ASSESS: Malori has the diagnosis of obesity and her BMI today is 52 Delyla is in the action stage of change   ADVISE: Gavina was educated on the multiple health risks of obesity as well as the benefit of weight loss to improve her health. She was advised of the need for long term treatment and the importance of lifestyle modifications.  AGREE: Multiple dietary modification options and treatment options were discussed and  Jadis agreed to the above obesity treatment plan.   I have reviewed the above documentation for accuracy and completeness, and I agree with the above. -Dennard Nip, MD

## 2017-11-21 LAB — CBC WITH DIFFERENTIAL
BASOS: 1 %
Basophils Absolute: 0.1 10*3/uL (ref 0.0–0.2)
EOS (ABSOLUTE): 0.2 10*3/uL (ref 0.0–0.4)
Eos: 4 %
Hematocrit: 37.5 % (ref 34.0–46.6)
Hemoglobin: 13 g/dL (ref 11.1–15.9)
IMMATURE GRANS (ABS): 0 10*3/uL (ref 0.0–0.1)
IMMATURE GRANULOCYTES: 0 %
LYMPHS: 27 %
Lymphocytes Absolute: 1.6 10*3/uL (ref 0.7–3.1)
MCH: 30.5 pg (ref 26.6–33.0)
MCHC: 34.7 g/dL (ref 31.5–35.7)
MCV: 88 fL (ref 79–97)
MONOS ABS: 0.4 10*3/uL (ref 0.1–0.9)
Monocytes: 7 %
Neutrophils Absolute: 3.6 10*3/uL (ref 1.4–7.0)
Neutrophils: 61 %
RBC: 4.26 x10E6/uL (ref 3.77–5.28)
RDW: 13.5 % (ref 12.3–15.4)
WBC: 5.9 10*3/uL (ref 3.4–10.8)

## 2017-11-21 LAB — COMPREHENSIVE METABOLIC PANEL
A/G RATIO: 2.1 (ref 1.2–2.2)
ALT: 32 IU/L (ref 0–32)
AST: 31 IU/L (ref 0–40)
Albumin: 5.1 g/dL (ref 3.5–5.5)
Alkaline Phosphatase: 72 IU/L (ref 39–117)
BUN/Creatinine Ratio: 19 (ref 9–23)
BUN: 23 mg/dL (ref 6–24)
Bilirubin Total: 0.3 mg/dL (ref 0.0–1.2)
CALCIUM: 10.3 mg/dL — AB (ref 8.7–10.2)
CHLORIDE: 96 mmol/L (ref 96–106)
CO2: 18 mmol/L — ABNORMAL LOW (ref 20–29)
Creatinine, Ser: 1.24 mg/dL — ABNORMAL HIGH (ref 0.57–1.00)
GFR calc Af Amer: 55 mL/min/{1.73_m2} — ABNORMAL LOW (ref >59)
GFR, EST NON AFRICAN AMERICAN: 48 mL/min/{1.73_m2} — AB (ref >59)
GLUCOSE: 82 mg/dL (ref 65–99)
Globulin, Total: 2.4 g/dL (ref 1.5–4.5)
POTASSIUM: 4.7 mmol/L (ref 3.5–5.2)
Sodium: 134 mmol/L (ref 134–144)
TOTAL PROTEIN: 7.5 g/dL (ref 6.0–8.5)

## 2017-11-21 LAB — LIPID PANEL WITH LDL/HDL RATIO
Cholesterol, Total: 194 mg/dL (ref 100–199)
HDL: 97 mg/dL (ref >39)
LDL Calculated: 82 mg/dL (ref 0–99)
LDL/HDL RATIO: 0.8 ratio (ref 0.0–3.2)
TRIGLYCERIDES: 73 mg/dL (ref 0–149)
VLDL Cholesterol Cal: 15 mg/dL (ref 5–40)

## 2017-11-21 LAB — HEMOGLOBIN A1C
ESTIMATED AVERAGE GLUCOSE: 117 mg/dL
HEMOGLOBIN A1C: 5.7 % — AB (ref 4.8–5.6)

## 2017-11-21 LAB — VITAMIN D 25 HYDROXY (VIT D DEFICIENCY, FRACTURES): VIT D 25 HYDROXY: 41 ng/mL (ref 30.0–100.0)

## 2017-11-21 LAB — VITAMIN B12: VITAMIN B 12: 761 pg/mL (ref 232–1245)

## 2017-11-21 LAB — T3: T3, Total: 112 ng/dL (ref 71–180)

## 2017-11-21 LAB — INSULIN, RANDOM: INSULIN: 11.2 u[IU]/mL (ref 2.6–24.9)

## 2017-11-21 LAB — T4, FREE: Free T4: 1.41 ng/dL (ref 0.82–1.77)

## 2017-11-21 LAB — TSH: TSH: 1.88 u[IU]/mL (ref 0.450–4.500)

## 2017-11-21 LAB — FOLATE: Folate: 10.8 ng/mL (ref >3.0)

## 2017-11-21 NOTE — Addendum Note (Signed)
Addended by: Dennard Nip D on: 11/21/2017 07:51 AM   Modules accepted: Level of Service

## 2017-12-04 ENCOUNTER — Ambulatory Visit (INDEPENDENT_AMBULATORY_CARE_PROVIDER_SITE_OTHER): Payer: 59 | Admitting: Family Medicine

## 2017-12-04 VITALS — BP 105/73 | HR 85 | Temp 98.5°F | Ht 62.0 in | Wt 180.0 lb

## 2017-12-04 DIAGNOSIS — E559 Vitamin D deficiency, unspecified: Secondary | ICD-10-CM

## 2017-12-04 DIAGNOSIS — R7303 Prediabetes: Secondary | ICD-10-CM | POA: Diagnosis not present

## 2017-12-04 DIAGNOSIS — N182 Chronic kidney disease, stage 2 (mild): Secondary | ICD-10-CM | POA: Insufficient documentation

## 2017-12-04 DIAGNOSIS — Z9189 Other specified personal risk factors, not elsewhere classified: Secondary | ICD-10-CM

## 2017-12-04 DIAGNOSIS — Z6833 Body mass index (BMI) 33.0-33.9, adult: Secondary | ICD-10-CM

## 2017-12-04 DIAGNOSIS — E669 Obesity, unspecified: Secondary | ICD-10-CM | POA: Diagnosis not present

## 2017-12-04 MED ORDER — VITAMIN D (ERGOCALCIFEROL) 1.25 MG (50000 UNIT) PO CAPS: 50000.0000 [IU] | ORAL_CAPSULE | ORAL | 0 refills | Status: DC

## 2017-12-04 NOTE — Progress Notes (Signed)
Office: [phone]  /  Fax: [phone]   HPI:   Chief Complaint: OBESITY Kristen Underwood is here to discuss her progress with her obesity treatment plan. She is on the Category 1 plan and is following her eating plan approximately 100 % of the time. She states she is walking for 30 minutes 3 times per week. Kristen Underwood didn't eat all her breakfast due to getting full quickly, otherwise she did well following her category 1 plan. Kristen Underwood states hunger was controlled. Her weight is 180 lb (81.6 kg) today and has had a weight loss of 1 pound over a period of 2 weeks since her last visit. She has lost 1 lb since starting treatment with [name].  Chronic Kidney Disease stage II Kristen Underwood has a diagnosis of CKD with GFR at 48. She has a history of hypertension, not always controlled in the past, but it is now. Kristen Underwood is on ACE and states she started it right about the time her GFR dropped below 60.  Vitamin D deficiency Kristen Underwood has a diagnosis of vitamin D deficiency. She is on OTC vit D @1 ,000 IU daily and is almost at goal. Kristen Underwood denies nausea, vomiting or muscle weakness.   Ref. Range 11/20/2017 11:58  Vitamin D, 25-Hydroxy Latest Ref Range: 30.0 - 100.0 ng/mL 41.0   Pre-Diabetes Kristen Underwood has a new diagnosis of pre-diabetes based on her elevated Hgb A1c at 5.7 and was informed this puts her at greater risk of developing diabetes. She has no polyphagia while on the lower simple carbohydrate diet. Kristen Underwood had episodes of hypoglycemia in the past, but none while following her diet prescription. She is not taking metformin currently and continues to work on diet and exercise to decrease risk of diabetes. She denies nausea.  At risk for diabetes Kristen Underwood is at higher than average risk for developing diabetes due to her obesity and pre-diabetes. She currently denies polyuria or polydipsia.  ALLERGIES: No Known Allergies  MEDICATIONS: Current Outpatient Medications on File Prior to Visit  Medication Sig Dispense Refill  . aspirin 81 MG  tablet Take 81 mg by mouth daily.    Marland Kitchen atorvastatin (LIPITOR) 20 MG tablet TAKE 1 TABLET BY MOUTH DAILY 90 tablet 1  . cholecalciferol (VITAMIN D) 1000 UNITS tablet Take 1,000 Units by mouth daily.      . fluticasone (FLONASE) 50 MCG/ACT nasal spray Place 2 sprays into both nostrils daily. 16 g 5  . lisinopril-hydrochlorothiazide (PRINZIDE,ZESTORETIC) 20-12.5 MG tablet Take 1 tablet by mouth daily. 90 tablet 1  . Omega-3 Fatty Acids (FISH OIL) 1000 MG CAPS Take by mouth daily.    . Pyridoxine HCl (VITAMIN B-6) 500 MG tablet Take 500 mg by mouth daily.     No current facility-administered medications on file prior to visit.     PAST MEDICAL HISTORY: Past Medical History:  Diagnosis Date  . Back pain   . BRCA negative   . Cough, persistent   . Elevated cholesterol   . GERD (gastroesophageal reflux disease)   . Hyperlipidemia   . Hypertension    10/09/13:  pt denies  . Migraine    is the reason she takes BP med  . Neck pain     PAST SURGICAL HISTORY: Past Surgical History:  Procedure Laterality Date  . ABDOMINAL HYSTERECTOMY    . APPENDECTOMY    . TONSILLECTOMY AND ADENOIDECTOMY  1966    SOCIAL HISTORY: Social History   Tobacco Use  . Smoking status: Never Smoker  . Smokeless tobacco: Never Used  Substance Use Topics  . Alcohol use: Yes    Comment: RARE,SOCIAL  . Drug use: No    FAMILY HISTORY: Family History  Problem Relation Age of Onset  . Cancer Mother        BREAST.....AUNTS, GRANDMOTHER, Christy Sartorius  . Diabetes Mother   . Hyperlipidemia Mother   . Stroke Mother   . Depression Mother   . Obesity Mother   . Cancer Father        PROSTATE  . Hypertension Father   . Mental illness Sister        had urosepsis--  and had schizo breakdown    ROS: Review of Systems  Constitutional: Positive for weight loss.  Gastrointestinal: Negative for nausea and vomiting.  Genitourinary: Negative for frequency.  Musculoskeletal:       Negative muscle weakness    Endo/Heme/Allergies: Negative for polydipsia.       Negative polyphagia Negative hypoglycemia    PHYSICAL EXAM: Blood pressure 105/73, pulse 85, temperature 98.5 F (36.9 C), temperature source Oral, height 5' 2"  (1.575 m), weight 180 lb (81.6 kg), SpO2 97 %. Body mass index is 32.92 kg/m. Physical Exam  Constitutional: She is oriented to person, place, and time. She appears well-developed and well-nourished.  Cardiovascular: Normal rate.  Pulmonary/Chest: Effort normal.  Musculoskeletal: Normal range of motion.  Neurological: She is oriented to person, place, and time.  Skin: Skin is warm and dry.  Psychiatric: She has a normal mood and affect. Her behavior is normal.  Vitals reviewed.   RECENT LABS AND TESTS: BMET    Component Value Date/Time   NA 134 11/20/2017 1158   K 4.7 11/20/2017 1158   CL 96 11/20/2017 1158   CO2 18 (L) 11/20/2017 1158   GLUCOSE 82 11/20/2017 1158   GLUCOSE 106 (H) 06/13/2017 0820   BUN 23 11/20/2017 1158   CREATININE 1.24 (H) 11/20/2017 1158   CALCIUM 10.3 (H) 11/20/2017 1158   GFRNONAA 48 (L) 11/20/2017 1158   GFRAA 55 (L) 11/20/2017 1158   Lab Results  Component Value Date   HGBA1C 5.7 (H) 11/20/2017   Lab Results  Component Value Date   INSULIN 11.2 11/20/2017   CBC    Component Value Date/Time   WBC 5.9 11/20/2017 1158   WBC 5.4 10/04/2013 1123   RBC 4.26 11/20/2017 1158   RBC 4.40 10/04/2013 1123   HGB 13.0 11/20/2017 1158   HCT 37.5 11/20/2017 1158   PLT 349.0 10/04/2013 1123   MCV 88 11/20/2017 1158   MCH 30.5 11/20/2017 1158   MCHC 34.7 11/20/2017 1158   MCHC 34.6 10/04/2013 1123   RDW 13.5 11/20/2017 1158   LYMPHSABS 1.6 11/20/2017 1158   MONOABS 0.3 10/04/2013 1123   EOSABS 0.2 11/20/2017 1158   BASOSABS 0.1 11/20/2017 1158   Iron/TIBC/Ferritin/ %Sat No results found for: IRON, TIBC, FERRITIN, IRONPCTSAT Lipid Panel     Component Value Date/Time   CHOL 194 11/20/2017 1158   TRIG 73 11/20/2017 1158   HDL 97  11/20/2017 1158   CHOLHDL 3 06/13/2017 0820   VLDL 21.2 06/13/2017 0820   LDLCALC 82 11/20/2017 1158   LDLDIRECT 151.8 07/22/2010 0904   Hepatic Function Panel     Component Value Date/Time   PROT 7.5 11/20/2017 1158   ALBUMIN 5.1 11/20/2017 1158   AST 31 11/20/2017 1158   ALT 32 11/20/2017 1158   ALKPHOS 72 11/20/2017 1158   BILITOT 0.3 11/20/2017 1158   BILIDIR 0.1 02/12/2015 1511   IBILI 0.4 09/29/2010  2010      Component Value Date/Time   TSH 1.880 11/20/2017 1158   TSH 1.30 10/04/2013 1123   TSH 1.34 04/24/2012 0804     Ref. Range 11/20/2017 11:58  Vitamin D, 25-Hydroxy Latest Ref Range: 30.0 - 100.0 ng/mL 41.0   ASSESSMENT AND PLAN: Stage 2 chronic kidney disease  Vitamin D deficiency - Plan: Vitamin D, Ergocalciferol, (DRISDOL) 50000 units CAPS capsule  Prediabetes  At risk for diabetes mellitus  Class 1 obesity with serious comorbidity and body mass index (BMI) of 33.0 to 33.9 in adult, unspecified obesity type  PLAN:  Chronic Kidney Disease stage II Kristen Underwood was encouraged to continue her medications as prescribed. She agrees to increase her H2O intake and keep tight control of her blood pressure while continuing with weight loss. We will monitor her blood pressure every  2 weeks.  Vitamin D Deficiency Kristen Underwood was informed that low vitamin D levels contributes to fatigue and are associated with obesity, breast, and colon cancer. She agrees to continue to take OTC Vit D @1 ,000 IU every day and start prescription vitamin D @50 ,000 IU every week #4 with no refills and will follow up for routine testing of vitamin D, at least 2-3 times per year. She was informed of the risk of over-replacement of vitamin D and agrees to not increase her dose unless she discusses this with [name] first. Kristen Underwood agrees to follow up with our clinic in 2 weeks.  Pre-Diabetes Kristen Underwood will continue to work on weight loss, exercise, and decreasing simple carbohydrates in her diet to help decrease the risk  of diabetes. We dicussed metformin including benefits and risks. She was informed that eating too many simple carbohydrates or too many calories at one sitting increases the likelihood of GI side effects. Kristen Underwood will defer metformin for now, due to decreased polyphagia and chronic kidney disease. Kristen Underwood agreed to follow up with [name] as directed to monitor her progress.  Diabetes risk counseling Kristen Underwood was given extended (30 minutes) diabetes prevention counseling today. She is 62 y.o. female and has risk factors for diabetes including obesity and pre-diabetes. We discussed intensive lifestyle modifications today with an emphasis on weight loss as well as increasing exercise and decreasing simple carbohydrates in her diet.  Obesity Kristen Underwood is currently in the action stage of change. As such, her goal is to continue with weight loss efforts She has agreed to follow the Category 1 plan Kristen Underwood has been instructed to work up to a goal of 150 minutes of combined cardio and strengthening exercise per week for weight loss and overall health benefits. We discussed the following Behavioral Modification Strategies today: increase H2O intake, increasing lean protein intake, decreasing simple carbohydrates and decreasing sodium intake  Kristen Underwood has agreed to follow up with our clinic in 2 weeks. She was informed of the importance of frequent follow up visits to maximize her success with intensive lifestyle modifications for her multiple health conditions.   OBESITY BEHAVIORAL INTERVENTION VISIT  Today's visit was # 2 out of 22.  Starting weight: 181 lbs Starting date: 11/20/17 Today's weight : 180 lbs Today's date: 12/04/2017 Total lbs lost to date: 1 (Patients must lose 7 lbs in the first 6 months to continue with counseling)   ASK: We discussed the diagnosis of obesity with Kristen Underwood today and Kristen Underwood agreed to give [name] permission to discuss obesity behavioral modification therapy today.  ASSESS: Kristen Underwood has the  diagnosis of obesity and her BMI today is 32.91 Kristen Underwood is in the  action stage of change   ADVISE: Kristen Underwood was educated on the multiple health risks of obesity as well as the benefit of weight loss to improve her health. She was advised of the need for long term treatment and the importance of lifestyle modifications.  AGREE: Multiple dietary modification options and treatment options were discussed and  Kristen Underwood agreed to the above obesity treatment plan.  I, Doreene Nest, am acting as transcriptionist for Dennard Nip, MD  I have reviewed the above documentation for accuracy and completeness, and I agree with the above. -Dennard Nip, MD

## 2017-12-18 ENCOUNTER — Ambulatory Visit (INDEPENDENT_AMBULATORY_CARE_PROVIDER_SITE_OTHER): Payer: 59 | Admitting: Family Medicine

## 2017-12-18 VITALS — BP 95/66 | HR 110 | Temp 98.6°F | Ht 62.0 in | Wt 176.0 lb

## 2017-12-18 DIAGNOSIS — E669 Obesity, unspecified: Secondary | ICD-10-CM | POA: Diagnosis not present

## 2017-12-18 DIAGNOSIS — Z6832 Body mass index (BMI) 32.0-32.9, adult: Secondary | ICD-10-CM

## 2017-12-18 DIAGNOSIS — E860 Dehydration: Secondary | ICD-10-CM | POA: Insufficient documentation

## 2017-12-18 NOTE — Progress Notes (Signed)
Office: [phone]  /  Fax: [phone]   HPI:   Chief Complaint: OBESITY Kristen Underwood is here to discuss her progress with her obesity treatment plan. She is on the Category 1 plan and is following her eating plan approximately 98 % of the time. She states she is exercising 0 minutes 0 times per week. Kristen Underwood continues to do well with weight loss on the Category 1 plan but she is getting bored with breakfast options. Her hunger is controlled and she is getting good support from her husband.  Her weight is 176 lb (79.8 kg) today and has had a weight loss of 4 pounds over a period of 2 weeks since her last visit. She has lost 5 lbs since starting treatment with [name].  Dehydration Kristen Underwood has a history of chronic kidney disease, she is working on increasing H20 intake. She denies lightheadedness.  ALLERGIES: No Known Allergies  MEDICATIONS: Current Outpatient Medications on File Prior to Visit  Medication Sig Dispense Refill  . aspirin 81 MG tablet Take 81 mg by mouth daily.    Marland Kitchen atorvastatin (LIPITOR) 20 MG tablet TAKE 1 TABLET BY MOUTH DAILY 90 tablet 1  . cholecalciferol (VITAMIN D) 1000 UNITS tablet Take 1,000 Units by mouth daily.      . fluticasone (FLONASE) 50 MCG/ACT nasal spray Place 2 sprays into both nostrils daily. 16 g 5  . lisinopril-hydrochlorothiazide (PRINZIDE,ZESTORETIC) 20-12.5 MG tablet Take 1 tablet by mouth daily. 90 tablet 1  . Omega-3 Fatty Acids (FISH OIL) 1000 MG CAPS Take by mouth daily.    . Pyridoxine HCl (VITAMIN B-6) 500 MG tablet Take 500 mg by mouth daily.    . Vitamin D, Ergocalciferol, (DRISDOL) 50000 units CAPS capsule Take 1 capsule (50,000 Units total) by mouth every 7 (seven) days. 4 capsule 0   No current facility-administered medications on file prior to visit.     PAST MEDICAL HISTORY: Past Medical History:  Diagnosis Date  . Back pain   . BRCA negative   . Cough, persistent   . Elevated cholesterol   . GERD (gastroesophageal reflux disease)   .  Hyperlipidemia   . Hypertension    10/09/13:  pt denies  . Migraine    is the reason she takes BP med  . Neck pain     PAST SURGICAL HISTORY: Past Surgical History:  Procedure Laterality Date  . ABDOMINAL HYSTERECTOMY    . APPENDECTOMY    . TONSILLECTOMY AND ADENOIDECTOMY  1966    SOCIAL HISTORY: Social History   Tobacco Use  . Smoking status: Never Smoker  . Smokeless tobacco: Never Used  Substance Use Topics  . Alcohol use: Yes    Comment: RARE,SOCIAL  . Drug use: No    FAMILY HISTORY: Family History  Problem Relation Age of Onset  . Cancer Mother        BREAST.....AUNTS, GRANDMOTHER, Christy Sartorius  . Diabetes Mother   . Hyperlipidemia Mother   . Stroke Mother   . Depression Mother   . Obesity Mother   . Cancer Father        PROSTATE  . Hypertension Father   . Mental illness Sister        had urosepsis--  and had schizo breakdown    ROS: Review of Systems  Constitutional: Positive for weight loss.  Neurological:       Negative lightheadedness    PHYSICAL EXAM: Blood pressure 95/66, pulse (!) 110, temperature 98.6 F (37 C), temperature source Oral, height 5' 2" (  1.575 m), weight 176 lb (79.8 kg), SpO2 99 %. Body mass index is 32.19 kg/m. Physical Exam  Constitutional: She is oriented to person, place, and time. She appears well-developed and well-nourished.  Cardiovascular: Normal rate.  Pulmonary/Chest: Effort normal.  Musculoskeletal: Normal range of motion.  Neurological: She is oriented to person, place, and time.  Skin: Skin is warm and dry.  Psychiatric: She has a normal mood and affect. Her behavior is normal.  Vitals reviewed.   RECENT LABS AND TESTS: BMET    Component Value Date/Time   NA 134 11/20/2017 1158   K 4.7 11/20/2017 1158   CL 96 11/20/2017 1158   CO2 18 (L) 11/20/2017 1158   GLUCOSE 82 11/20/2017 1158   GLUCOSE 106 (H) 06/13/2017 0820   BUN 23 11/20/2017 1158   CREATININE 1.24 (H) 11/20/2017 1158   CALCIUM 10.3  (H) 11/20/2017 1158   GFRNONAA 48 (L) 11/20/2017 1158   GFRAA 55 (L) 11/20/2017 1158   Lab Results  Component Value Date   HGBA1C 5.7 (H) 11/20/2017   Lab Results  Component Value Date   INSULIN 11.2 11/20/2017   CBC    Component Value Date/Time   WBC 5.9 11/20/2017 1158   WBC 5.4 10/04/2013 1123   RBC 4.26 11/20/2017 1158   RBC 4.40 10/04/2013 1123   HGB 13.0 11/20/2017 1158   HCT 37.5 11/20/2017 1158   PLT 349.0 10/04/2013 1123   MCV 88 11/20/2017 1158   MCH 30.5 11/20/2017 1158   MCHC 34.7 11/20/2017 1158   MCHC 34.6 10/04/2013 1123   RDW 13.5 11/20/2017 1158   LYMPHSABS 1.6 11/20/2017 1158   MONOABS 0.3 10/04/2013 1123   EOSABS 0.2 11/20/2017 1158   BASOSABS 0.1 11/20/2017 1158   Iron/TIBC/Ferritin/ %Sat No results found for: IRON, TIBC, FERRITIN, IRONPCTSAT Lipid Panel     Component Value Date/Time   CHOL 194 11/20/2017 1158   TRIG 73 11/20/2017 1158   HDL 97 11/20/2017 1158   CHOLHDL 3 06/13/2017 0820   VLDL 21.2 06/13/2017 0820   LDLCALC 82 11/20/2017 1158   LDLDIRECT 151.8 07/22/2010 0904   Hepatic Function Panel     Component Value Date/Time   PROT 7.5 11/20/2017 1158   ALBUMIN 5.1 11/20/2017 1158   AST 31 11/20/2017 1158   ALT 32 11/20/2017 1158   ALKPHOS 72 11/20/2017 1158   BILITOT 0.3 11/20/2017 1158   BILIDIR 0.1 02/12/2015 1511   IBILI 0.4 09/29/2010 2010      Component Value Date/Time   TSH 1.880 11/20/2017 1158   TSH 1.30 10/04/2013 1123   TSH 1.34 04/24/2012 0804    ASSESSMENT AND PLAN: Dehydration  Class 1 obesity with serious comorbidity and body mass index (BMI) of 32.0 to 32.9 in adult, unspecified obesity type  PLAN:  Dehydration Kristen Underwood agrees to continue to increase H20 to 80 oz daily and will monitor closely. Kristen Underwood agrees to follow up with our clinic in 2 weeks.  We spent > than 50% of the 15 minute visit on the counseling as documented in the note.  Obesity Kristen Underwood is currently in the action stage of change. As such,  her goal is to continue with weight loss efforts She has agreed to follow the Category 1 plan + breakfast options-1/2 cup of 2% cottage cheese, 1 slice of light bread, and 1 TBSP of whipped peanut butter Kristen Underwood has been instructed to work up to a goal of 150 minutes of combined cardio and strengthening exercise per week for weight loss  and overall health benefits. We discussed the following Behavioral Modification Strategies today: work on meal planning and easy cooking plans and increase H20 intake   Kristen Underwood has agreed to follow up with our clinic in 2 weeks. She was informed of the importance of frequent follow up visits to maximize her success with intensive lifestyle modifications for her multiple health conditions.   OBESITY BEHAVIORAL INTERVENTION VISIT  Today's visit was # 3 out of 22.  Starting weight: 181 lbs Starting date: 11/20/17 Today's weight : 176 lbs  Today's date: 12/18/2017 Total lbs lost to date: 5 (Patients must lose 7 lbs in the first 6 months to continue with counseling)   ASK: We discussed the diagnosis of obesity with Kristen Underwood today and Kristen Underwood agreed to give [name] permission to discuss obesity behavioral modification therapy today.  ASSESS: Kristen Underwood has the diagnosis of obesity and her BMI today is 32.18 Kristen Underwood is in the action stage of change   ADVISE: Kristen Underwood was educated on the multiple health risks of obesity as well as the benefit of weight loss to improve her health. She was advised of the need for long term treatment and the importance of lifestyle modifications.  AGREE: Multiple dietary modification options and treatment options were discussed and  Kristen Underwood agreed to the above obesity treatment plan.  I, Trixie Dredge, am acting as transcriptionist for Dennard Nip, MD  I have reviewed the above documentation for accuracy and completeness, and I agree with the above. -Dennard Nip, MD

## 2017-12-31 ENCOUNTER — Other Ambulatory Visit (INDEPENDENT_AMBULATORY_CARE_PROVIDER_SITE_OTHER): Payer: Self-pay | Admitting: Family Medicine

## 2017-12-31 DIAGNOSIS — E559 Vitamin D deficiency, unspecified: Secondary | ICD-10-CM

## 2018-01-01 ENCOUNTER — Ambulatory Visit (INDEPENDENT_AMBULATORY_CARE_PROVIDER_SITE_OTHER): Payer: 59 | Admitting: Family Medicine

## 2018-01-01 VITALS — BP 107/73 | HR 63 | Temp 98.1°F | Ht 62.0 in | Wt 174.0 lb

## 2018-01-01 DIAGNOSIS — Z6831 Body mass index (BMI) 31.0-31.9, adult: Secondary | ICD-10-CM

## 2018-01-01 DIAGNOSIS — R0789 Other chest pain: Secondary | ICD-10-CM | POA: Diagnosis not present

## 2018-01-01 DIAGNOSIS — E860 Dehydration: Secondary | ICD-10-CM

## 2018-01-01 DIAGNOSIS — E669 Obesity, unspecified: Secondary | ICD-10-CM | POA: Diagnosis not present

## 2018-01-02 NOTE — Progress Notes (Signed)
Office: [phone]  /  Fax: [phone]   HPI:   Chief Complaint: OBESITY Kristen Underwood is here to discuss her progress with her obesity treatment plan. She is on the Category 1 plan + breakfast options-1/2 cup of 2% cottage cheese, 1 slice of light bread, and 1 TBSP of whipped peanut butter and is following her eating plan approximately 98 % of the time. She states she is exercising 0 minutes 0 times per week. Kristen Underwood continues to do well with weight loss. She made some substitutions though. Kristen Underwood does well for lunch and struggles to eat all her protein. Her weight is 174 lb (78.9 kg) today and has had a weight loss of 2 pounds over a period of 2 weeks since her last visit. She has lost 7 lbs since starting treatment with [name].  Dehydration Kristen Underwood has decreased her water intake in the last 2 weeks, and is feeling more lightheaded since her upper respiratory infection. Her blood pressure is low normal.  Chest Tightness Kristen Underwood notes feeling "tight" in her chest after recovering from an upper respiratory infection. She has had pulmonary testing in the past, but there is no history of asthma or tobacco use. She notes feeling lightheaded on her walks in the last 2 weeks. She has albuterol from her PCP but doesn't use it.  ALLERGIES: No Known Allergies  MEDICATIONS: Current Outpatient Medications on File Prior to Visit  Medication Sig Dispense Refill  . aspirin 81 MG tablet Take 81 mg by mouth daily.    Marland Kitchen atorvastatin (LIPITOR) 20 MG tablet TAKE 1 TABLET BY MOUTH DAILY 90 tablet 1  . cholecalciferol (VITAMIN D) 1000 UNITS tablet Take 1,000 Units by mouth daily.      . fluticasone (FLONASE) 50 MCG/ACT nasal spray Place 2 sprays into both nostrils daily. 16 g 5  . lisinopril-hydrochlorothiazide (PRINZIDE,ZESTORETIC) 20-12.5 MG tablet Take 1 tablet by mouth daily. 90 tablet 1  . Omega-3 Fatty Acids (FISH OIL) 1000 MG CAPS Take by mouth daily.    . Pyridoxine HCl (VITAMIN B-6) 500 MG tablet Take 500 mg by  mouth daily.    . Vitamin D, Ergocalciferol, (DRISDOL) 50000 units CAPS capsule Take 1 capsule (50,000 Units total) by mouth every 7 (seven) days. 4 capsule 0   No current facility-administered medications on file prior to visit.     PAST MEDICAL HISTORY: Past Medical History:  Diagnosis Date  . Back pain   . BRCA negative   . Cough, persistent   . Elevated cholesterol   . GERD (gastroesophageal reflux disease)   . Hyperlipidemia   . Hypertension    10/09/13:  pt denies  . Migraine    is the reason she takes BP med  . Neck pain     PAST SURGICAL HISTORY: Past Surgical History:  Procedure Laterality Date  . ABDOMINAL HYSTERECTOMY    . APPENDECTOMY    . TONSILLECTOMY AND ADENOIDECTOMY  1966    SOCIAL HISTORY: Social History   Tobacco Use  . Smoking status: Never Smoker  . Smokeless tobacco: Never Used  Substance Use Topics  . Alcohol use: Yes    Comment: RARE,SOCIAL  . Drug use: No    FAMILY HISTORY: Family History  Problem Relation Age of Onset  . Cancer Mother        BREAST.....AUNTS, GRANDMOTHER, Christy Sartorius  . Diabetes Mother   . Hyperlipidemia Mother   . Stroke Mother   . Depression Mother   . Obesity Mother   . Cancer Father  PROSTATE  . Hypertension Father   . Mental illness Sister        had urosepsis--  and had schizo breakdown    ROS: Review of Systems  Constitutional: Positive for weight loss.  Respiratory:       Positive for chest tightness  Neurological:       Positive for lightheadedness    PHYSICAL EXAM: Blood pressure 107/73, pulse 63, temperature 98.1 F (36.7 C), temperature source Oral, height _0  (1.575 m), weight 174 lb (78.9 kg), SpO2 99 %. Body mass index is 31.83 kg/m. Physical Exam  Constitutional: She is oriented to person, place, and time. She appears well-developed and well-nourished.  Cardiovascular: Normal rate.  Pulmonary/Chest: Effort normal.  Musculoskeletal: Normal range of motion.  Neurological:  She is oriented to person, place, and time.  Skin: Skin is warm and dry.  Psychiatric: She has a normal mood and affect. Her behavior is normal.  Vitals reviewed.   RECENT LABS AND TESTS: BMET    Component Value Date/Time   NA 134 11/20/2017 1158   K 4.7 11/20/2017 1158   CL 96 11/20/2017 1158   CO2 18 (L) 11/20/2017 1158   GLUCOSE 82 11/20/2017 1158   GLUCOSE 106 (H) 06/13/2017 0820   BUN 23 11/20/2017 1158   CREATININE 1.24 (H) 11/20/2017 1158   CALCIUM 10.3 (H) 11/20/2017 1158   GFRNONAA 48 (L) 11/20/2017 1158   GFRAA 55 (L) 11/20/2017 1158   Lab Results  Component Value Date   HGBA1C 5.7 (H) 11/20/2017   Lab Results  Component Value Date   INSULIN 11.2 11/20/2017   CBC    Component Value Date/Time   WBC 5.9 11/20/2017 1158   WBC 5.4 10/04/2013 1123   RBC 4.26 11/20/2017 1158   RBC 4.40 10/04/2013 1123   HGB 13.0 11/20/2017 1158   HCT 37.5 11/20/2017 1158   PLT 349.0 10/04/2013 1123   MCV 88 11/20/2017 1158   MCH 30.5 11/20/2017 1158   MCHC 34.7 11/20/2017 1158   MCHC 34.6 10/04/2013 1123   RDW 13.5 11/20/2017 1158   LYMPHSABS 1.6 11/20/2017 1158   MONOABS 0.3 10/04/2013 1123   EOSABS 0.2 11/20/2017 1158   BASOSABS 0.1 11/20/2017 1158   Iron/TIBC/Ferritin/ %Sat No results found for: IRON, TIBC, FERRITIN, IRONPCTSAT Lipid Panel     Component Value Date/Time   CHOL 194 11/20/2017 1158   TRIG 73 11/20/2017 1158   HDL 97 11/20/2017 1158   CHOLHDL 3 06/13/2017 0820   VLDL 21.2 06/13/2017 0820   LDLCALC 82 11/20/2017 1158   LDLDIRECT 151.8 07/22/2010 0904   Hepatic Function Panel     Component Value Date/Time   PROT 7.5 11/20/2017 1158   ALBUMIN 5.1 11/20/2017 1158   AST 31 11/20/2017 1158   ALT 32 11/20/2017 1158   ALKPHOS 72 11/20/2017 1158   BILITOT 0.3 11/20/2017 1158   BILIDIR 0.1 02/12/2015 1511   IBILI 0.4 09/29/2010 2010      Component Value Date/Time   TSH 1.880 11/20/2017 1158   TSH 1.30 10/04/2013 1123   TSH 1.34 04/24/2012 0804      ASSESSMENT AND PLAN: Dehydration  Chest tightness  Class 1 obesity with serious comorbidity and body mass index (BMI) of 31.0 to 31.9 in adult, unspecified obesity type  PLAN:  Dehydration Aishwarya agrees to increase her water intake to 80 ounces per day and we will re-evaluate in 2 weeks.  Chest Tightness Militza is advised to use 2 puffs of her albuterol 30 minutes  before her walk, and we will re-evaluate in 2 weeks.  We spent > than 50% of the 15 minute visit on the counseling as documented in the note.  Obesity Kristen Underwood is currently in the action stage of change. As such, her goal is to continue with weight loss efforts She has agreed to follow the Category 1 plan Kristen Underwood has been instructed to work up to a goal of 150 minutes of combined cardio and strengthening exercise per week for weight loss and overall health benefits. We discussed the following Behavioral Modification Strategies today: increasing lean protein intake, decreasing simple carbohydrates  and work on meal planning and easy cooking plans  Kristen Underwood has agreed to follow up with our clinic in 2 weeks. She was informed of the importance of frequent follow up visits to maximize her success with intensive lifestyle modifications for her multiple health conditions.   OBESITY BEHAVIORAL INTERVENTION VISIT  Today's visit was # 4 out of 22.  Starting weight: 181 lbs Starting date: 11/20/17 Today's weight : 174 lbs  Today's date: 01/01/2018 Total lbs lost to date: 7 (Patients must lose 7 lbs in the first 6 months to continue with counseling)   ASK: We discussed the diagnosis of obesity with Kristen Underwood today and Kristen Underwood agreed to give [name] permission to discuss obesity behavioral modification therapy today.  ASSESS: Kristen Underwood has the diagnosis of obesity and her BMI today is 31.82 Kristen Underwood is in the action stage of change   ADVISE: Kristen Underwood was educated on the multiple health risks of obesity as well as the benefit of weight loss to improve  her health. She was advised of the need for long term treatment and the importance of lifestyle modifications.  AGREE: Multiple dietary modification options and treatment options were discussed and  Kristen Underwood agreed to the above obesity treatment plan.  I, Doreene Nest, am acting as transcriptionist for Dennard Nip, MD  I have reviewed the above documentation for accuracy and completeness, and I agree with the above. -Dennard Nip, MD

## 2018-01-04 ENCOUNTER — Other Ambulatory Visit (INDEPENDENT_AMBULATORY_CARE_PROVIDER_SITE_OTHER): Payer: Self-pay | Admitting: Family Medicine

## 2018-01-04 DIAGNOSIS — E559 Vitamin D deficiency, unspecified: Secondary | ICD-10-CM

## 2018-01-09 ENCOUNTER — Encounter: Payer: Self-pay | Admitting: Family Medicine

## 2018-01-09 DIAGNOSIS — E782 Mixed hyperlipidemia: Secondary | ICD-10-CM

## 2018-01-09 DIAGNOSIS — E785 Hyperlipidemia, unspecified: Secondary | ICD-10-CM

## 2018-01-09 DIAGNOSIS — I10 Essential (primary) hypertension: Secondary | ICD-10-CM

## 2018-01-09 MED ORDER — ATORVASTATIN CALCIUM 20 MG PO TABS: 20.0000 mg | ORAL_TABLET | Freq: Every day | ORAL | 0 refills | Status: DC

## 2018-01-09 MED ORDER — LISINOPRIL-HYDROCHLOROTHIAZIDE 20-12.5 MG PO TABS: 1.0000 | ORAL_TABLET | Freq: Every day | ORAL | 0 refills | Status: DC

## 2018-01-10 ENCOUNTER — Ambulatory Visit (INDEPENDENT_AMBULATORY_CARE_PROVIDER_SITE_OTHER): Payer: 59 | Admitting: Family Medicine

## 2018-01-10 VITALS — BP 106/71 | HR 73 | Temp 98.2°F | Ht 62.0 in | Wt 174.0 lb

## 2018-01-10 DIAGNOSIS — E559 Vitamin D deficiency, unspecified: Secondary | ICD-10-CM | POA: Diagnosis not present

## 2018-01-10 DIAGNOSIS — E669 Obesity, unspecified: Secondary | ICD-10-CM

## 2018-01-10 DIAGNOSIS — Z6832 Body mass index (BMI) 32.0-32.9, adult: Secondary | ICD-10-CM

## 2018-01-10 DIAGNOSIS — Z9189 Other specified personal risk factors, not elsewhere classified: Secondary | ICD-10-CM | POA: Diagnosis not present

## 2018-01-10 MED ORDER — VITAMIN D (ERGOCALCIFEROL) 1.25 MG (50000 UNIT) PO CAPS
50000.0000 [IU] | ORAL_CAPSULE | ORAL | 0 refills | Status: DC
Start: 2018-01-10 — End: 2018-02-13

## 2018-01-10 NOTE — Progress Notes (Signed)
Office: [phone]  /  Fax: [phone]   HPI:   Chief Complaint: OBESITY Kristen Underwood is here to discuss her progress with her obesity treatment plan. She is on the Category 1 plan and is following her eating plan approximately 50 % of the time. She states she is walking for 30 minutes 3 times per week. [name] had increase traveling and eating out but did well enough to not gain weight. Hunger is controlled.  Her weight is 174 lb (78.9 kg) today and has not lost weight since her last visit. She has lost 7 lbs since starting treatment with [name].  Vitamin D Deficiency Ha has a diagnosis of vitamin D deficiency. She is stable on prescription Vit D and denies nausea, vomiting or muscle weakness. She is not yet at goal and notes no change in fatigue.  At risk for osteopenia and osteoporosis [name] is at higher risk of osteopenia and osteoporosis due to vitamin D deficiency.   ALLERGIES: No Known Allergies  MEDICATIONS: Current Outpatient Medications on File Prior to Visit  Medication Sig Dispense Refill  . aspirin 81 MG tablet Take 81 mg by mouth daily.    Marland Kitchen atorvastatin (LIPITOR) 20 MG tablet Take 1 tablet (20 mg total) by mouth daily. 90 tablet 0  . cholecalciferol (VITAMIN D) 1000 UNITS tablet Take 1,000 Units by mouth daily.      . fluticasone (FLONASE) 50 MCG/ACT nasal spray Place 2 sprays into both nostrils daily. 16 g 5  . lisinopril-hydrochlorothiazide (PRINZIDE,ZESTORETIC) 20-12.5 MG tablet Take 1 tablet by mouth daily. 90 tablet 0  . Omega-3 Fatty Acids (FISH OIL) 1000 MG CAPS Take by mouth daily.    . Pyridoxine HCl (VITAMIN B-6) 500 MG tablet Take 500 mg by mouth daily.     No current facility-administered medications on file prior to visit.     PAST MEDICAL HISTORY: Past Medical History:  Diagnosis Date  . Back pain   . BRCA negative   . Cough, persistent   . Elevated cholesterol   . GERD (gastroesophageal reflux disease)   . Hyperlipidemia   . Hypertension    10/09/13:   pt denies  . Migraine    is the reason she takes BP med  . Neck pain     PAST SURGICAL HISTORY: Past Surgical History:  Procedure Laterality Date  . ABDOMINAL HYSTERECTOMY    . APPENDECTOMY    . TONSILLECTOMY AND ADENOIDECTOMY  1966    SOCIAL HISTORY: Social History   Tobacco Use  . Smoking status: Never Smoker  . Smokeless tobacco: Never Used  Substance Use Topics  . Alcohol use: Yes    Comment: RARE,SOCIAL  . Drug use: No    FAMILY HISTORY: Family History  Problem Relation Age of Onset  . Cancer Mother        BREAST.....AUNTS, GRANDMOTHER, Christy Sartorius  . Diabetes Mother   . Hyperlipidemia Mother   . Stroke Mother   . Depression Mother   . Obesity Mother   . Cancer Father        PROSTATE  . Hypertension Father   . Mental illness Sister        had urosepsis--  and had schizo breakdown    ROS: Review of Systems  Constitutional: Positive for malaise/fatigue. Negative for weight loss.  Gastrointestinal: Negative for nausea and vomiting.  Musculoskeletal:       Negative muscle weakness    PHYSICAL EXAM: Blood pressure 106/71, pulse 73, temperature 98.2 F (36.8 C), temperature source Oral,  height 5' 2"  (1.575 m), weight 174 lb (78.9 kg), SpO2 98 %. Body mass index is 31.83 kg/m. Physical Exam  Constitutional: She is oriented to person, place, and time. She appears well-developed and well-nourished.  Cardiovascular: Normal rate.  Pulmonary/Chest: Effort normal.  Musculoskeletal: Normal range of motion.  Neurological: She is oriented to person, place, and time.  Skin: Skin is warm and dry.  Psychiatric: She has a normal mood and affect. Her behavior is normal.  Vitals reviewed.   RECENT LABS AND TESTS: BMET    Component Value Date/Time   NA 134 11/20/2017 1158   K 4.7 11/20/2017 1158   CL 96 11/20/2017 1158   CO2 18 (L) 11/20/2017 1158   GLUCOSE 82 11/20/2017 1158   GLUCOSE 106 (H) 06/13/2017 0820   BUN 23 11/20/2017 1158   CREATININE 1.24  (H) 11/20/2017 1158   CALCIUM 10.3 (H) 11/20/2017 1158   GFRNONAA 48 (L) 11/20/2017 1158   GFRAA 55 (L) 11/20/2017 1158   Lab Results  Component Value Date   HGBA1C 5.7 (H) 11/20/2017   Lab Results  Component Value Date   INSULIN 11.2 11/20/2017   CBC    Component Value Date/Time   WBC 5.9 11/20/2017 1158   WBC 5.4 10/04/2013 1123   RBC 4.26 11/20/2017 1158   RBC 4.40 10/04/2013 1123   HGB 13.0 11/20/2017 1158   HCT 37.5 11/20/2017 1158   PLT 349.0 10/04/2013 1123   MCV 88 11/20/2017 1158   MCH 30.5 11/20/2017 1158   MCHC 34.7 11/20/2017 1158   MCHC 34.6 10/04/2013 1123   RDW 13.5 11/20/2017 1158   LYMPHSABS 1.6 11/20/2017 1158   MONOABS 0.3 10/04/2013 1123   EOSABS 0.2 11/20/2017 1158   BASOSABS 0.1 11/20/2017 1158   Iron/TIBC/Ferritin/ %Sat No results found for: IRON, TIBC, FERRITIN, IRONPCTSAT Lipid Panel     Component Value Date/Time   CHOL 194 11/20/2017 1158   TRIG 73 11/20/2017 1158   HDL 97 11/20/2017 1158   CHOLHDL 3 06/13/2017 0820   VLDL 21.2 06/13/2017 0820   LDLCALC 82 11/20/2017 1158   LDLDIRECT 151.8 07/22/2010 0904   Hepatic Function Panel     Component Value Date/Time   PROT 7.5 11/20/2017 1158   ALBUMIN 5.1 11/20/2017 1158   AST 31 11/20/2017 1158   ALT 32 11/20/2017 1158   ALKPHOS 72 11/20/2017 1158   BILITOT 0.3 11/20/2017 1158   BILIDIR 0.1 02/12/2015 1511   IBILI 0.4 09/29/2010 2010      Component Value Date/Time   TSH 1.880 11/20/2017 1158   TSH 1.30 10/04/2013 1123   TSH 1.34 04/24/2012 0804  Results for ALISHBA, NAPLES (MRN [id number]) as of 01/10/2018 08:40  Ref. Range 11/20/2017 11:58  Vitamin D, 25-Hydroxy Latest Ref Range: 30.0 - 100.0 ng/mL 41.0    ASSESSMENT AND PLAN: Vitamin D deficiency - Plan: Vitamin D, Ergocalciferol, (DRISDOL) 50000 units CAPS capsule  At risk for osteopenia  Class 1 obesity with serious comorbidity and body mass index (BMI) of 32.0 to 32.9 in adult, unspecified obesity type  PLAN:  Vitamin D  Deficiency Dominque was informed that low vitamin D levels contributes to fatigue and are associated with obesity, breast, and colon cancer. [name] agrees to continue taking prescription Vit D @50 ,000 IU every week #4 and we will refill for 1 month. She will follow up for routine testing of vitamin D, at least 2-3 times per year. She was informed of the risk of over-replacement of vitamin D and agrees to  not increase her dose unless she discusses this with [name] first. We will recheck labs in 1 month and Janelis agrees to follow up with our clinic in 2 weeks.  At risk for osteopenia and osteoporosis Roselynn is at risk for osteopenia and osteoporsis due to her vitamin D deficiency. She was encouraged to take her vitamin D and follow her higher calcium diet and increase strengthening exercise to help strengthen her bones and decrease her risk of osteopenia and osteoporosis.  Obesity Reyonna is currently in the action stage of change. As such, her goal is to continue with weight loss efforts She has agreed to follow the Category 1 plan Shakenna has been instructed to work up to a goal of 150 minutes of combined cardio and strengthening exercise per week for weight loss and overall health benefits. We discussed the following Behavioral Modification Strategies today: increasing lean protein intake and travel eating strategies   Kirstie has agreed to follow up with our clinic in 2 weeks. She was informed of the importance of frequent follow up visits to maximize her success with intensive lifestyle modifications for her multiple health conditions.   OBESITY BEHAVIORAL INTERVENTION VISIT  Today's visit was # 5 out of 22.  Starting weight: 181 lbs Starting date: 11/20/17 Today's weight : 174 lbs  Today's date: 01/10/2018 Total lbs lost to date: 7 (Patients must lose 7 lbs in the first 6 months to continue with counseling)   ASK: We discussed the diagnosis of obesity with [name] today and [name] agreed to give [name]  permission to discuss obesity behavioral modification therapy today.  ASSESS: Boneta has the diagnosis of obesity and her BMI today is 31.82 Natasa is in the action stage of change   ADVISE: Tahani was educated on the multiple health risks of obesity as well as the benefit of weight loss to improve her health. She was advised of the need for long term treatment and the importance of lifestyle modifications.  AGREE: Multiple dietary modification options and treatment options were discussed and  Kaori agreed to the above obesity treatment plan.  I, Trixie Dredge, am acting as transcriptionist for Dennard Nip, MD  I have reviewed the above documentation for accuracy and completeness, and I agree with the above. -Dennard Nip, MD

## 2018-01-23 ENCOUNTER — Ambulatory Visit: Payer: Self-pay | Admitting: Family Medicine

## 2018-01-25 ENCOUNTER — Ambulatory Visit (INDEPENDENT_AMBULATORY_CARE_PROVIDER_SITE_OTHER): Payer: 59 | Admitting: Family Medicine

## 2018-01-25 VITALS — BP 95/67 | HR 84 | Temp 98.1°F | Ht 62.0 in | Wt 170.0 lb

## 2018-01-25 DIAGNOSIS — E669 Obesity, unspecified: Secondary | ICD-10-CM

## 2018-01-25 DIAGNOSIS — Z9189 Other specified personal risk factors, not elsewhere classified: Secondary | ICD-10-CM

## 2018-01-25 DIAGNOSIS — Z6831 Body mass index (BMI) 31.0-31.9, adult: Secondary | ICD-10-CM | POA: Diagnosis not present

## 2018-01-25 DIAGNOSIS — I9589 Other hypotension: Secondary | ICD-10-CM | POA: Diagnosis not present

## 2018-01-25 NOTE — Progress Notes (Signed)
Office: [phone]  /  Fax: [phone]   HPI:   Chief Complaint: OBESITY Kristen Underwood is here to discuss her progress with her obesity treatment plan. She is on the Category 1 plan and is following her eating plan approximately 80 % of the time. She states she is walking 15 minutes 3 times per week. Kristen Underwood continues to do well with weight loss on the category1 plan, even with increased travel. She is doing better planning ahead and making strategies to have healthier food options. Kristen Underwood is not feeling hunger and she is not feeling deprived. Her weight is 170 lb (77.1 kg) today and has had a weight loss of 4 pounds over a period of 2 weeks since her last visit. She has lost 11 lbs since starting treatment with [name].  Hypotension (new) Kristen Underwood is a 63 y.o. female with new dx of hypotension. Her blood pressure decreased on lisinopril/HCTZ and diet. Kristen Underwood is starting to feel mildly drained and possibly lightheaded. She is attempting to increase her water intake. She has had no recent falls over the last 2 weeks. She is working weight loss to help control her blood pressure with the goal of decreasing her risk of heart attack and stroke. Kristen Underwood blood pressure is not currently controlled.  At risk for cardiovascular disease Kristen Underwood is at a higher than average risk for cardiovascular disease due to obesity. She currently denies any chest pain.  ALLERGIES: No Known Allergies  MEDICATIONS: Current Outpatient Medications on File Prior to Visit  Medication Sig Dispense Refill  . aspirin 81 MG tablet Take 81 mg by mouth daily.    Marland Kitchen atorvastatin (LIPITOR) 20 MG tablet Take 1 tablet (20 mg total) by mouth daily. 90 tablet 0  . cholecalciferol (VITAMIN D) 1000 UNITS tablet Take 1,000 Units by mouth daily.      . fluticasone (FLONASE) 50 MCG/ACT nasal spray Place 2 sprays into both nostrils daily. 16 g 5  . lisinopril-hydrochlorothiazide (PRINZIDE,ZESTORETIC) 20-12.5 MG tablet Take 1 tablet by mouth  daily. (Patient taking differently: Take 0.5 tablets by mouth daily. ) 90 tablet 0  . Omega-3 Fatty Acids (FISH OIL) 1000 MG CAPS Take by mouth daily.    . Pyridoxine HCl (VITAMIN B-6) 500 MG tablet Take 500 mg by mouth daily.    . Vitamin D, Ergocalciferol, (DRISDOL) 50000 units CAPS capsule Take 1 capsule (50,000 Units total) by mouth every 7 (seven) days. 4 capsule 0   No current facility-administered medications on file prior to visit.     PAST MEDICAL HISTORY: Past Medical History:  Diagnosis Date  . Back pain   . BRCA negative   . Cough, persistent   . Elevated cholesterol   . GERD (gastroesophageal reflux disease)   . Hyperlipidemia   . Hypertension    10/09/13:  pt denies  . Migraine    is the reason she takes BP med  . Neck pain     PAST SURGICAL HISTORY: Past Surgical History:  Procedure Laterality Date  . ABDOMINAL HYSTERECTOMY    . APPENDECTOMY    . TONSILLECTOMY AND ADENOIDECTOMY  1966    SOCIAL HISTORY: Social History   Tobacco Use  . Smoking status: Never Smoker  . Smokeless tobacco: Never Used  Substance Use Topics  . Alcohol use: Yes    Comment: RARE,SOCIAL  . Drug use: No    FAMILY HISTORY: Family History  Problem Relation Age of Onset  . Cancer Mother        BREAST.....AUNTS,  GRANDMOTHER, Christy Sartorius  . Diabetes Mother   . Hyperlipidemia Mother   . Stroke Mother   . Depression Mother   . Obesity Mother   . Cancer Father        PROSTATE  . Hypertension Father   . Mental illness Sister        had urosepsis--  and had schizo breakdown    ROS: Review of Systems  Constitutional: Positive for weight loss.  Cardiovascular: Negative for chest pain.  Neurological:       Possible lightheadedness    PHYSICAL EXAM: Blood pressure 95/67, pulse 84, temperature 98.1 F (36.7 C), temperature source Oral, height 5' 2"  (1.575 m), weight 170 lb (77.1 kg), SpO2 98 %. Body mass index is 31.09 kg/m. Physical Exam  Constitutional: She is  oriented to person, place, and time. She appears well-developed and well-nourished.  Cardiovascular: Normal rate.  Pulmonary/Chest: Effort normal.  Musculoskeletal: Normal range of motion.  Neurological: She is oriented to person, place, and time.  Skin: Skin is warm and dry.  Psychiatric: She has a normal mood and affect. Her behavior is normal.  Vitals reviewed.   RECENT LABS AND TESTS: BMET    Component Value Date/Time   NA 134 11/20/2017 1158   K 4.7 11/20/2017 1158   CL 96 11/20/2017 1158   CO2 18 (L) 11/20/2017 1158   GLUCOSE 82 11/20/2017 1158   GLUCOSE 106 (H) 06/13/2017 0820   BUN 23 11/20/2017 1158   CREATININE 1.24 (H) 11/20/2017 1158   CALCIUM 10.3 (H) 11/20/2017 1158   GFRNONAA 48 (L) 11/20/2017 1158   GFRAA 55 (L) 11/20/2017 1158   Lab Results  Component Value Date   HGBA1C 5.7 (H) 11/20/2017   Lab Results  Component Value Date   INSULIN 11.2 11/20/2017   CBC    Component Value Date/Time   WBC 5.9 11/20/2017 1158   WBC 5.4 10/04/2013 1123   RBC 4.26 11/20/2017 1158   RBC 4.40 10/04/2013 1123   HGB 13.0 11/20/2017 1158   HCT 37.5 11/20/2017 1158   PLT 349.0 10/04/2013 1123   MCV 88 11/20/2017 1158   MCH 30.5 11/20/2017 1158   MCHC 34.7 11/20/2017 1158   MCHC 34.6 10/04/2013 1123   RDW 13.5 11/20/2017 1158   LYMPHSABS 1.6 11/20/2017 1158   MONOABS 0.3 10/04/2013 1123   EOSABS 0.2 11/20/2017 1158   BASOSABS 0.1 11/20/2017 1158   Iron/TIBC/Ferritin/ %Sat No results found for: IRON, TIBC, FERRITIN, IRONPCTSAT Lipid Panel     Component Value Date/Time   CHOL 194 11/20/2017 1158   TRIG 73 11/20/2017 1158   HDL 97 11/20/2017 1158   CHOLHDL 3 06/13/2017 0820   VLDL 21.2 06/13/2017 0820   LDLCALC 82 11/20/2017 1158   LDLDIRECT 151.8 07/22/2010 0904   Hepatic Function Panel     Component Value Date/Time   PROT 7.5 11/20/2017 1158   ALBUMIN 5.1 11/20/2017 1158   AST 31 11/20/2017 1158   ALT 32 11/20/2017 1158   ALKPHOS 72 11/20/2017 1158    BILITOT 0.3 11/20/2017 1158   BILIDIR 0.1 02/12/2015 1511   IBILI 0.4 09/29/2010 2010      Component Value Date/Time   TSH 1.880 11/20/2017 1158   TSH 1.30 10/04/2013 1123   TSH 1.34 04/24/2012 0804    ASSESSMENT AND PLAN: Other specified hypotension  At risk for heart disease  Class 1 obesity with serious comorbidity and body mass index (BMI) of 31.0 to 31.9 in adult, unspecified obesity type  PLAN:  Hypotension (new) We discussed sodium restriction, working on healthy weight loss, and a regular exercise program as the means to achieve improved blood pressure control. [name] agreed with this plan and agreed to follow up as directed.She agrees to decrease lisinopril/HCTZ to half pill daily  and we will recheck her blood pressure in 2 to 3 weeks. We will continue to monitor her blood pressure as well as her progress with the above lifestyle modifications.   Cardiovascular risk counseling Anija was given extended (15 minutes) coronary artery disease prevention counseling today. She is 62 y.o. female and has risk factors for heart disease including obesity. We discussed intensive lifestyle modifications today with an emphasis on specific weight loss instructions and strategies. Pt was also informed of the importance of increasing exercise and decreasing saturated fats to help prevent heart disease.  Obesity Fumie is currently in the action stage of change. As such, her goal is to continue with weight loss efforts She has agreed to follow the Category 1 plan Nattalie has been instructed to work up to a goal of 150 minutes of combined cardio and strengthening exercise per week for weight loss and overall health benefits. We discussed the following Behavioral Modification Strategies today: increasing lean protein intake, decreasing simple carbohydrates , decrease eating out and work on meal planning and easy cooking plans  Anahli has agreed to follow up with our clinic in 3 weeks. She was informed  of the importance of frequent follow up visits to maximize her success with intensive lifestyle modifications for her multiple health conditions.   OBESITY BEHAVIORAL INTERVENTION VISIT  Today's visit was # 6 out of 22.  Starting weight: 181 lbs Starting date: 11/20/17 Today's weight : 170 lbs Today's date: 01/25/2018 Total lbs lost to date: 11 (Patients must lose 7 lbs in the first 6 months to continue with counseling)   ASK: We discussed the diagnosis of obesity with Kristen Underwood today and [name] agreed to give [name] permission to discuss obesity behavioral modification therapy today.  ASSESS: Rhyli has the diagnosis of obesity and her BMI today is 31.09 Gricelda is in the action stage of change   ADVISE: Cheray was educated on the multiple health risks of obesity as well as the benefit of weight loss to improve her health. She was advised of the need for long term treatment and the importance of lifestyle modifications.  AGREE: Multiple dietary modification options and treatment options were discussed and  Braeleigh agreed to the above obesity treatment plan.  I, Doreene Nest, am acting as transcriptionist for Dennard Nip, MD  I have reviewed the above documentation for accuracy and completeness, and I agree with the above. -Dennard Nip, MD

## 2018-02-02 ENCOUNTER — Ambulatory Visit: Payer: Self-pay | Admitting: Family Medicine

## 2018-02-05 ENCOUNTER — Ambulatory Visit (INDEPENDENT_AMBULATORY_CARE_PROVIDER_SITE_OTHER): Payer: 59 | Admitting: Family Medicine

## 2018-02-05 ENCOUNTER — Encounter: Payer: Self-pay | Admitting: Family Medicine

## 2018-02-05 VITALS — BP 112/68 | HR 68 | Temp 97.9°F | Resp 16 | Ht 62.5 in | Wt 169.8 lb

## 2018-02-05 DIAGNOSIS — E785 Hyperlipidemia, unspecified: Secondary | ICD-10-CM

## 2018-02-05 DIAGNOSIS — I10 Essential (primary) hypertension: Secondary | ICD-10-CM

## 2018-02-05 LAB — COMPREHENSIVE METABOLIC PANEL
ALT: 28 U/L (ref 0–35)
AST: 24 U/L (ref 0–37)
Albumin: 4.5 g/dL (ref 3.5–5.2)
Alkaline Phosphatase: 62 U/L (ref 39–117)
BUN: 28 mg/dL — ABNORMAL HIGH (ref 6–23)
CALCIUM: 10.6 mg/dL — AB (ref 8.4–10.5)
CHLORIDE: 100 meq/L (ref 96–112)
CO2: 27 meq/L (ref 19–32)
CREATININE: 1.04 mg/dL (ref 0.40–1.20)
GFR: 57.49 mL/min — AB (ref >60.00)
Glucose, Bld: 110 mg/dL — ABNORMAL HIGH (ref 70–99)
Potassium: 4.8 mEq/L (ref 3.5–5.1)
Sodium: 135 mEq/L (ref 135–145)
Total Bilirubin: 0.5 mg/dL (ref 0.2–1.2)
Total Protein: 7.5 g/dL (ref 6.0–8.3)

## 2018-02-05 LAB — LIPID PANEL
CHOL/HDL RATIO: 2
Cholesterol: 186 mg/dL (ref 0–200)
HDL: 77 mg/dL (ref >39.00)
LDL CALC: 99 mg/dL (ref 0–99)
NonHDL: 108.81
TRIGLYCERIDES: 48 mg/dL (ref 0.0–149.0)
VLDL: 9.6 mg/dL (ref 0.0–40.0)

## 2018-02-05 MED ORDER — LISINOPRIL-HYDROCHLOROTHIAZIDE 10-12.5 MG PO TABS: 1.0000 | ORAL_TABLET | Freq: Every day | ORAL | 3 refills | Status: DC

## 2018-02-05 NOTE — Assessment & Plan Note (Signed)
Tolerating statin, encouraged heart healthy diet, avoid trans fats, minimize simple carbs and saturated fats. Increase exercise as tolerated 

## 2018-02-05 NOTE — Assessment & Plan Note (Signed)
Running low today Dec lisinopril dose to 10/12.5  rto for bp check 2-3 weeks  Or sooner prn

## 2018-02-05 NOTE — Patient Instructions (Signed)

## 2018-02-05 NOTE — Progress Notes (Signed)
Subjective:  I acted as a Education administrator for [employer]. Yancey Flemings, [place]   Patient ID: Kristen Underwood, female    DOB: Sep 12, 1956, 62 y.o.   MRN: [id number]  Chief Complaint  Patient presents with  . Hyperlipidemia    HPI  Patient is in today for follow up on hyperlipidemia.  No complaints  She has been seeing Dr Leafy Ro and is doing well with weight loss.   bp has been running low   Patient Care Team: Carollee Herter, Alferd Apa, DO as PCP - General   Past Medical History:  Diagnosis Date  . Back pain   . BRCA negative   . Cough, persistent   . Elevated cholesterol   . GERD (gastroesophageal reflux disease)   . Hyperlipidemia   . Hypertension    10/09/13:  pt denies  . Migraine    is the reason she takes BP med  . Neck pain     Past Surgical History:  Procedure Laterality Date  . ABDOMINAL HYSTERECTOMY    . APPENDECTOMY    . TONSILLECTOMY AND ADENOIDECTOMY  1966    Family History  Problem Relation Age of Onset  . Cancer Mother        BREAST.....AUNTS, GRANDMOTHER, Christy Sartorius  . Diabetes Mother   . Hyperlipidemia Mother   . Stroke Mother   . Depression Mother   . Obesity Mother   . Cancer Father        PROSTATE  . Hypertension Father   . Mental illness Sister        had urosepsis--  and had schizo breakdown    Social History   Socioeconomic History  . Marital status: Married    Spouse name: Copywriter, advertising  . Number of children: 3  . Years of education: Not on file  . Highest education level: Not on file  Occupational History  . Occupation: Engineer, building services: [employer]  . Financial resource strain: Not on file  . Food insecurity:    Worry: Not on file    Inability: Not on file  . Transportation needs:    Medical: Not on file    Non-medical: Not on file  Tobacco Use  . Smoking status: Never Smoker  . Smokeless tobacco: Never Used  Substance and Sexual Activity  . Alcohol use: Yes    Comment: RARE,SOCIAL  . Drug use:  No  . Sexual activity: Yes    Partners: Male  Lifestyle  . Physical activity:    Days per week: Not on file    Minutes per session: Not on file  . Stress: Not on file  Relationships  . Social connections:    Talks on phone: Not on file    Gets together: Not on file    Attends religious service: Not on file    Active member of club or organization: Not on file    Attends meetings of clubs or organizations: Not on file    Relationship status: Not on file  . Intimate partner violence:    Fear of current or ex partner: Not on file    Emotionally abused: Not on file    Physically abused: Not on file    Forced sexual activity: Not on file  Other Topics Concern  . Not on file  Social History Narrative   Exercise--not lately    Outpatient Medications Prior to Visit  Medication Sig Dispense Refill  . aspirin 81 MG tablet Take 81 mg by mouth  daily.    . atorvastatin (LIPITOR) 20 MG tablet Take 1 tablet (20 mg total) by mouth daily. 90 tablet 0  . cholecalciferol (VITAMIN D) 1000 UNITS tablet Take 1,000 Units by mouth daily.      . fluticasone (FLONASE) 50 MCG/ACT nasal spray Place 2 sprays into both nostrils daily. 16 g 5  . Omega-3 Fatty Acids (FISH OIL) 1000 MG CAPS Take by mouth daily.    . Pyridoxine HCl (VITAMIN B-6) 500 MG tablet Take 500 mg by mouth daily.    . Vitamin D, Ergocalciferol, (DRISDOL) 50000 units CAPS capsule Take 1 capsule (50,000 Units total) by mouth every 7 (seven) days. 4 capsule 0  . lisinopril-hydrochlorothiazide (PRINZIDE,ZESTORETIC) 20-12.5 MG tablet Take 1 tablet by mouth daily. (Patient taking differently: Take 0.5 tablets by mouth daily. ) 90 tablet 0   No facility-administered medications prior to visit.     No Known Allergies  Review of Systems  Constitutional: Negative for chills, fever and malaise/fatigue.  HENT: Negative for congestion and hearing loss.   Eyes: Negative for discharge.  Respiratory: Negative for cough, sputum production and  shortness of breath.   Cardiovascular: Negative for chest pain, palpitations and leg swelling.  Gastrointestinal: Negative for abdominal pain, blood in stool, constipation, diarrhea, heartburn, nausea and vomiting.  Genitourinary: Negative for dysuria, frequency, hematuria and urgency.  Musculoskeletal: Negative for back pain, falls and myalgias.  Skin: Negative for rash.  Neurological: Negative for dizziness, sensory change, loss of consciousness, weakness and headaches.  Endo/Heme/Allergies: Negative for environmental allergies. Does not bruise/bleed easily.  Psychiatric/Behavioral: Negative for depression and suicidal ideas. The patient is not nervous/anxious and does not have insomnia.        Objective:    Physical Exam  Constitutional: She is oriented to person, place, and time. She appears well-developed and well-nourished.  HENT:  Head: Normocephalic and atraumatic.  Eyes: Conjunctivae and EOM are normal.  Neck: Normal range of motion. Neck supple. No JVD present. Carotid bruit is not present. No thyromegaly present.  Cardiovascular: Normal rate, regular rhythm and normal heart sounds.  No murmur heard. Pulmonary/Chest: Effort normal and breath sounds normal. No respiratory distress. She has no wheezes. She has no rales. She exhibits no tenderness.  Musculoskeletal: She exhibits no edema.  Neurological: She is alert and oriented to person, place, and time.  Psychiatric: She has a normal mood and affect.  Nursing note and vitals reviewed.   BP 112/68 (BP Location: Left Arm, Patient Position: Sitting, Cuff Size: Large)   Pulse 68   Temp 97.9 F (36.6 C) (Oral)   Resp 16   Ht 5' 2.5" (1.588 m)   Wt 169 lb 12.8 oz (77 kg)   SpO2 98%   BMI 30.56 kg/m  Wt Readings from Last 3 Encounters:  02/05/18 169 lb 12.8 oz (77 kg)  01/25/18 170 lb (77.1 kg)  01/10/18 174 lb (78.9 kg)   BP Readings from Last 3 Encounters:  02/05/18 112/68  01/25/18 95/67  01/10/18 106/71      Immunization History  Administered Date(s) Administered  . Influenza Whole 10/14/2009, 08/02/2010  . Influenza,inj,Quad PF,6+ Mos 10/04/2013, 09/07/2015, 08/15/2017  . Pneumococcal Polysaccharide-23 08/15/2017  . Td 10/20/2004    Health Maintenance  Topic Date Due  . Hepatitis C Screening  Nov 15, 1955  . HIV Screening  04/29/1973  . PAP SMEAR  10/04/2016  . COLONOSCOPY  10/23/2017  . TETANUS/TDAP  02/06/2020 (Originally 10/20/2014)  . INFLUENZA VACCINE  06/07/2018  . MAMMOGRAM  08/17/2018  Lab Results  Component Value Date   WBC 5.9 11/20/2017   HGB 13.0 11/20/2017   HCT 37.5 11/20/2017   PLT 349.0 10/04/2013   GLUCOSE 82 11/20/2017   CHOL 194 11/20/2017   TRIG 73 11/20/2017   HDL 97 11/20/2017   LDLDIRECT 151.8 07/22/2010   LDLCALC 82 11/20/2017   ALT 32 11/20/2017   AST 31 11/20/2017   NA 134 11/20/2017   K 4.7 11/20/2017   CL 96 11/20/2017   CREATININE 1.24 (H) 11/20/2017   BUN 23 11/20/2017   CO2 18 (L) 11/20/2017   TSH 1.880 11/20/2017   HGBA1C 5.7 (H) 11/20/2017    Lab Results  Component Value Date   TSH 1.880 11/20/2017   Lab Results  Component Value Date   WBC 5.9 11/20/2017   HGB 13.0 11/20/2017   HCT 37.5 11/20/2017   MCV 88 11/20/2017   PLT 349.0 10/04/2013   Lab Results  Component Value Date   NA 134 11/20/2017   K 4.7 11/20/2017   CO2 18 (L) 11/20/2017   GLUCOSE 82 11/20/2017   BUN 23 11/20/2017   CREATININE 1.24 (H) 11/20/2017   BILITOT 0.3 11/20/2017   ALKPHOS 72 11/20/2017   AST 31 11/20/2017   ALT 32 11/20/2017   PROT 7.5 11/20/2017   ALBUMIN 5.1 11/20/2017   CALCIUM 10.3 (H) 11/20/2017   GFR 51.31 (L) 06/13/2017   Lab Results  Component Value Date   CHOL 194 11/20/2017   Lab Results  Component Value Date   HDL 97 11/20/2017   Lab Results  Component Value Date   LDLCALC 82 11/20/2017   Lab Results  Component Value Date   TRIG 73 11/20/2017   Lab Results  Component Value Date   CHOLHDL 3 06/13/2017    Lab Results  Component Value Date   HGBA1C 5.7 (H) 11/20/2017         Assessment & Plan:   Problem List Items Addressed This Visit      Unprioritized   Essential hypertension    Running low today Dec lisinopril dose to 10/12.5  rto for bp check 2-3 weeks  Or sooner prn       Relevant Medications   lisinopril-hydrochlorothiazide (PRINZIDE,ZESTORETIC) 10-12.5 MG tablet   Hyperlipidemia - Primary    Tolerating statin, encouraged heart healthy diet, avoid trans fats, minimize simple carbs and saturated fats. Increase exercise as tolerated      Relevant Medications   lisinopril-hydrochlorothiazide (PRINZIDE,ZESTORETIC) 10-12.5 MG tablet   Other Relevant Orders   Lipid panel   Comprehensive metabolic panel      I have discontinued Erma Melander's lisinopril-hydrochlorothiazide. I am also having her start on lisinopril-hydrochlorothiazide. Additionally, I am having her maintain her cholecalciferol, vitamin B-6, fluticasone, aspirin, Fish Oil, atorvastatin, and Vitamin D (Ergocalciferol).  Meds ordered this encounter  Medications  . lisinopril-hydrochlorothiazide (PRINZIDE,ZESTORETIC) 10-12.5 MG tablet    Sig: Take 1 tablet by mouth daily.    Dispense:  90 tablet    Refill:  3    CMA served as scribe during this visit. History, Physical and Plan performed by medical provider. Documentation and orders reviewed and attested to.  Ann Held, DO

## 2018-02-08 ENCOUNTER — Other Ambulatory Visit (INDEPENDENT_AMBULATORY_CARE_PROVIDER_SITE_OTHER): Payer: Self-pay | Admitting: Family Medicine

## 2018-02-08 DIAGNOSIS — E559 Vitamin D deficiency, unspecified: Secondary | ICD-10-CM

## 2018-02-12 ENCOUNTER — Other Ambulatory Visit: Payer: Self-pay | Admitting: *Deleted

## 2018-02-12 ENCOUNTER — Other Ambulatory Visit (INDEPENDENT_AMBULATORY_CARE_PROVIDER_SITE_OTHER): Payer: Self-pay | Admitting: Family Medicine

## 2018-02-12 DIAGNOSIS — E559 Vitamin D deficiency, unspecified: Secondary | ICD-10-CM

## 2018-02-13 ENCOUNTER — Other Ambulatory Visit: Payer: Self-pay | Admitting: Family Medicine

## 2018-02-13 ENCOUNTER — Ambulatory Visit (INDEPENDENT_AMBULATORY_CARE_PROVIDER_SITE_OTHER): Payer: 59 | Admitting: Family Medicine

## 2018-02-13 VITALS — BP 97/67 | HR 69 | Temp 97.5°F | Ht 62.0 in | Wt 167.0 lb

## 2018-02-13 DIAGNOSIS — E785 Hyperlipidemia, unspecified: Secondary | ICD-10-CM

## 2018-02-13 DIAGNOSIS — E669 Obesity, unspecified: Secondary | ICD-10-CM | POA: Diagnosis not present

## 2018-02-13 DIAGNOSIS — Z6830 Body mass index (BMI) 30.0-30.9, adult: Secondary | ICD-10-CM | POA: Diagnosis not present

## 2018-02-13 DIAGNOSIS — I9589 Other hypotension: Secondary | ICD-10-CM

## 2018-02-13 DIAGNOSIS — E559 Vitamin D deficiency, unspecified: Secondary | ICD-10-CM

## 2018-02-13 DIAGNOSIS — Z9189 Other specified personal risk factors, not elsewhere classified: Secondary | ICD-10-CM

## 2018-02-13 DIAGNOSIS — I10 Essential (primary) hypertension: Secondary | ICD-10-CM

## 2018-02-13 DIAGNOSIS — E782 Mixed hyperlipidemia: Secondary | ICD-10-CM

## 2018-02-13 MED ORDER — VITAMIN D (ERGOCALCIFEROL) 1.25 MG (50000 UNIT) PO CAPS: 50000.0000 [IU] | ORAL_CAPSULE | ORAL | 0 refills | Status: DC

## 2018-02-13 NOTE — Telephone Encounter (Signed)
Duplicate request

## 2018-02-13 NOTE — Telephone Encounter (Signed)
DUPLICATE REQUEST

## 2018-02-14 NOTE — Progress Notes (Signed)
Office: [phone]  /  Fax: [phone]   HPI:   Chief Complaint: OBESITY Kristen Underwood is here to discuss her progress with her obesity treatment plan. She is on the Category 1 plan and is following her eating plan approximately 80 % of the time. She states she is walking for 20 minutes 3 times per week. Artie continues to do well with weight loss on her category 1 plan. She states her hunger is controlled. She is walking for exercise. Her weight is 167 lb (75.8 kg) today and has had a weight loss of 3 pounds over a period of 2 to 3 weeks since her last visit. She has lost 14 lbs since starting treatment with [name].  Vitamin D deficiency [name] has a diagnosis of vitamin D deficiency. She is currently taking vit D and is not yet at goal. [name] denies nausea, vomiting or muscle weakness.  Hypotension [name] is a 62 y.o. female with hypotension. She was instructed to decrease her lisinopril-HCTZ dose at her last visit 2 weeks ago, due to symptomatic hypotension. She did so, but her blood pressure is still low. She is working weight loss to help control her blood pressure with the goal of decreasing her risk of heart attack and stroke. [name] blood pressure is not currently controlled.  At risk for cardiovascular disease [name] is at a higher than average risk for cardiovascular disease due to obesity. She currently denies any chest pain.  ALLERGIES: No Known Allergies  MEDICATIONS: Current Outpatient Medications on File Prior to Visit  Medication Sig Dispense Refill  . aspirin 81 MG tablet Take 81 mg by mouth daily.    Marland Kitchen atorvastatin (LIPITOR) 20 MG tablet Take 1 tablet (20 mg total) by mouth daily. 90 tablet 0  . cholecalciferol (VITAMIN D) 1000 UNITS tablet Take 1,000 Units by mouth daily.      . fluticasone (FLONASE) 50 MCG/ACT nasal spray Place 2 sprays into both nostrils daily. 16 g 5  . lisinopril-hydrochlorothiazide (PRINZIDE,ZESTORETIC) 10-12.5 MG tablet Take 1 tablet by mouth daily. 90  tablet 3  . Omega-3 Fatty Acids (FISH OIL) 1000 MG CAPS Take by mouth daily.    . Pyridoxine HCl (VITAMIN B-6) 500 MG tablet Take 500 mg by mouth daily.     No current facility-administered medications on file prior to visit.     PAST MEDICAL HISTORY: Past Medical History:  Diagnosis Date  . Back pain   . BRCA negative   . Cough, persistent   . Elevated cholesterol   . GERD (gastroesophageal reflux disease)   . Hyperlipidemia   . Hypertension    10/09/13:  pt denies  . Migraine    is the reason she takes BP med  . Neck pain     PAST SURGICAL HISTORY: Past Surgical History:  Procedure Laterality Date  . ABDOMINAL HYSTERECTOMY    . APPENDECTOMY    . TONSILLECTOMY AND ADENOIDECTOMY  1966    SOCIAL HISTORY: Social History   Tobacco Use  . Smoking status: Never Smoker  . Smokeless tobacco: Never Used  Substance Use Topics  . Alcohol use: Yes    Comment: RARE,SOCIAL  . Drug use: No    FAMILY HISTORY: Family History  Problem Relation Age of Onset  . Cancer Mother        BREAST.....AUNTS, GRANDMOTHER, Christy Sartorius  . Diabetes Mother   . Hyperlipidemia Mother   . Stroke Mother   . Depression Mother   . Obesity Mother   . Cancer Father  PROSTATE  . Hypertension Father   . Mental illness Sister        had urosepsis--  and had schizo breakdown    ROS: Review of Systems  Constitutional: Positive for weight loss.  Cardiovascular: Negative for chest pain.  Gastrointestinal: Negative for nausea and vomiting.  Musculoskeletal:       Negative for muscle weakness    PHYSICAL EXAM: Blood pressure 97/67, pulse 69, temperature (!) 97.5 F (36.4 C), temperature source Oral, height 5' 2"  (1.575 m), weight 167 lb (75.8 kg), SpO2 97 %. Body mass index is 30.54 kg/m. Physical Exam  Constitutional: She is oriented to person, place, and time. She appears well-developed and well-nourished.  Cardiovascular: Normal rate.  Pulmonary/Chest: Effort normal.    Musculoskeletal: Normal range of motion.  Neurological: She is oriented to person, place, and time.  Skin: Skin is warm and dry.  Psychiatric: She has a normal mood and affect. Her behavior is normal.  Vitals reviewed.   RECENT LABS AND TESTS: BMET    Component Value Date/Time   NA 135 02/05/2018 1050   NA 134 11/20/2017 1158   K 4.8 02/05/2018 1050   CL 100 02/05/2018 1050   CO2 27 02/05/2018 1050   GLUCOSE 110 (H) 02/05/2018 1050   BUN 28 (H) 02/05/2018 1050   BUN 23 11/20/2017 1158   CREATININE 1.04 02/05/2018 1050   CALCIUM 10.6 (H) 02/05/2018 1050   GFRNONAA 48 (L) 11/20/2017 1158   GFRAA 55 (L) 11/20/2017 1158   Lab Results  Component Value Date   HGBA1C 5.7 (H) 11/20/2017   Lab Results  Component Value Date   INSULIN 11.2 11/20/2017   CBC    Component Value Date/Time   WBC 5.9 11/20/2017 1158   WBC 5.4 10/04/2013 1123   RBC 4.26 11/20/2017 1158   RBC 4.40 10/04/2013 1123   HGB 13.0 11/20/2017 1158   HCT 37.5 11/20/2017 1158   PLT 349.0 10/04/2013 1123   MCV 88 11/20/2017 1158   MCH 30.5 11/20/2017 1158   MCHC 34.7 11/20/2017 1158   MCHC 34.6 10/04/2013 1123   RDW 13.5 11/20/2017 1158   LYMPHSABS 1.6 11/20/2017 1158   MONOABS 0.3 10/04/2013 1123   EOSABS 0.2 11/20/2017 1158   BASOSABS 0.1 11/20/2017 1158   Iron/TIBC/Ferritin/ %Sat No results found for: IRON, TIBC, FERRITIN, IRONPCTSAT Lipid Panel     Component Value Date/Time   CHOL 186 02/05/2018 1050   CHOL 194 11/20/2017 1158   TRIG 48.0 02/05/2018 1050   HDL 77.00 02/05/2018 1050   HDL 97 11/20/2017 1158   CHOLHDL 2 02/05/2018 1050   VLDL 9.6 02/05/2018 1050   LDLCALC 99 02/05/2018 1050   LDLCALC 82 11/20/2017 1158   LDLDIRECT 151.8 07/22/2010 0904   Hepatic Function Panel     Component Value Date/Time   PROT 7.5 02/05/2018 1050   PROT 7.5 11/20/2017 1158   ALBUMIN 4.5 02/05/2018 1050   ALBUMIN 5.1 11/20/2017 1158   AST 24 02/05/2018 1050   ALT 28 02/05/2018 1050   ALKPHOS 62  02/05/2018 1050   BILITOT 0.5 02/05/2018 1050   BILITOT 0.3 11/20/2017 1158   BILIDIR 0.1 02/12/2015 1511   IBILI 0.4 09/29/2010 2010      Component Value Date/Time   TSH 1.880 11/20/2017 1158   TSH 1.30 10/04/2013 1123   TSH 1.34 04/24/2012 0804   Results for LOWELLA, KINDLEY (MRN [id number]) as of 02/14/2018 12:02  Ref. Range 11/20/2017 11:58  Vitamin D, 25-Hydroxy Latest Ref Range: 30.0 -  100.0 ng/mL 41.0   ASSESSMENT AND PLAN: Other specified hypotension  Vitamin D deficiency - Plan: Vitamin D, Ergocalciferol, (DRISDOL) 50000 units CAPS capsule  At risk for heart disease  Class 1 obesity with serious comorbidity and body mass index (BMI) of 30.0 to 30.9 in adult, unspecified obesity type  PLAN:  Vitamin D Deficiency Chantea was informed that low vitamin D levels contributes to fatigue and are associated with obesity, breast, and colon cancer. She agrees to continue to take prescription Vit D @50 ,000 IU every week #4 with no refills and will follow up for routine testing of vitamin D, at least 2-3 times per year. She was informed of the risk of over-replacement of vitamin D and agrees to not increase her dose unless she discusses this with [name] first. Micheal agrees to follow up with our clinic in 2 to 3 weeks.  Hypotension We discussed sodium restriction, working on healthy weight loss, and a regular exercise program as the means to achieve improved blood pressure control. [name] agreed with this plan and agreed to increase her H2O intake. [name] follow up as directed. We will continue to monitor her blood pressure as well as her progress with the above lifestyle modifications. She will continue her medications at the current dose and we will discontinue HCTZ if her blood pressure is still low at the next visit. Fatema will watch for signs of hypotension as she continues her lifestyle modifications.  Cardiovascular risk counseling Alvaretta was given extended (15 minutes) coronary artery disease  prevention counseling today. She is 62 y.o. female and has risk factors for heart disease including obesity. We discussed intensive lifestyle modifications today with an emphasis on specific weight loss instructions and strategies. Pt was also informed of the importance of increasing exercise and decreasing saturated fats to help prevent heart disease.  Obesity Kathe is currently in the action stage of change. As such, her goal is to continue with weight loss efforts She has agreed to follow the Category 1 plan Amany has been instructed to work up to a goal of 150 minutes of combined cardio and strengthening exercise per week or core and leg strengthening exercises 10 minutes daily for weight loss and overall health benefits. We discussed the following Behavioral Modification Strategies today: increasing lean protein intake and decreasing simple carbohydrates   Ciana has agreed to follow up with our clinic in 2 to 3 weeks. She was informed of the importance of frequent follow up visits to maximize her success with intensive lifestyle modifications for her multiple health conditions.   OBESITY BEHAVIORAL INTERVENTION VISIT  Today's visit was # 7 out of 22.  Starting weight: 181 lbs Starting date: 11/20/17 Today's weight : 167 lbs Today's date: 02/13/2018 Total lbs lost to date: 14 (Patients must lose 7 lbs in the first 6 months to continue with counseling)   ASK: We discussed the diagnosis of obesity with [name] today and [name] agreed to give [name] permission to discuss obesity behavioral modification therapy today.  ASSESS: Rashawna has the diagnosis of obesity and her BMI today is 30.54 Nathifa is in the action stage of change   ADVISE: Krisna was educated on the multiple health risks of obesity as well as the benefit of weight loss to improve her health. She was advised of the need for long term treatment and the importance of lifestyle modifications.  AGREE: Multiple dietary modification  options and treatment options were discussed and  Glada agreed to the above obesity treatment plan.  I, Doreene Nest, am acting as transcriptionist for Dennard Nip, MD  I have reviewed the above documentation for accuracy and completeness, and I agree with the above. -Dennard Nip, MD

## 2018-02-15 MED ORDER — ATORVASTATIN CALCIUM 20 MG PO TABS: 20.0000 mg | ORAL_TABLET | Freq: Every day | ORAL | 1 refills | Status: DC

## 2018-02-28 ENCOUNTER — Ambulatory Visit (INDEPENDENT_AMBULATORY_CARE_PROVIDER_SITE_OTHER): Payer: 59 | Admitting: Family Medicine

## 2018-02-28 VITALS — BP 95/66 | HR 64 | Temp 98.1°F | Ht 62.0 in | Wt 164.0 lb

## 2018-02-28 DIAGNOSIS — R7303 Prediabetes: Secondary | ICD-10-CM | POA: Diagnosis not present

## 2018-02-28 DIAGNOSIS — E669 Obesity, unspecified: Secondary | ICD-10-CM

## 2018-02-28 DIAGNOSIS — Z6830 Body mass index (BMI) 30.0-30.9, adult: Secondary | ICD-10-CM

## 2018-02-28 DIAGNOSIS — E559 Vitamin D deficiency, unspecified: Secondary | ICD-10-CM

## 2018-02-28 DIAGNOSIS — Z9189 Other specified personal risk factors, not elsewhere classified: Secondary | ICD-10-CM | POA: Diagnosis not present

## 2018-02-28 DIAGNOSIS — E66811 Obesity, class 1: Secondary | ICD-10-CM

## 2018-02-28 MED ORDER — VITAMIN D (ERGOCALCIFEROL) 1.25 MG (50000 UNIT) PO CAPS
50000.0000 [IU] | ORAL_CAPSULE | ORAL | 0 refills | Status: DC
Start: 2018-02-28 — End: 2018-03-21

## 2018-03-01 LAB — INSULIN, RANDOM: INSULIN: 8.3 u[IU]/mL (ref 2.6–24.9)

## 2018-03-01 LAB — COMPREHENSIVE METABOLIC PANEL
ALK PHOS: 68 IU/L (ref 39–117)
ALT: 30 IU/L (ref 0–32)
AST: 27 IU/L (ref 0–40)
Albumin/Globulin Ratio: 2.2 (ref 1.2–2.2)
Albumin: 4.4 g/dL (ref 3.5–5.5)
BUN/Creatinine Ratio: 26 — ABNORMAL HIGH (ref 9–23)
BUN: 30 mg/dL — AB (ref 6–24)
Bilirubin Total: 0.4 mg/dL (ref 0.0–1.2)
CALCIUM: 10 mg/dL (ref 8.7–10.2)
CO2: 23 mmol/L (ref 20–29)
CREATININE: 1.17 mg/dL — AB (ref 0.57–1.00)
Chloride: 101 mmol/L (ref 96–106)
GFR calc Af Amer: 59 mL/min/{1.73_m2} — ABNORMAL LOW (ref >59)
GFR, EST NON AFRICAN AMERICAN: 51 mL/min/{1.73_m2} — AB (ref >59)
GLUCOSE: 98 mg/dL (ref 65–99)
Globulin, Total: 2 g/dL (ref 1.5–4.5)
Potassium: 4.7 mmol/L (ref 3.5–5.2)
Sodium: 135 mmol/L (ref 134–144)
Total Protein: 6.4 g/dL (ref 6.0–8.5)

## 2018-03-01 LAB — HEMOGLOBIN A1C
ESTIMATED AVERAGE GLUCOSE: 120 mg/dL
Hgb A1c MFr Bld: 5.8 % — ABNORMAL HIGH (ref 4.8–5.6)

## 2018-03-01 LAB — VITAMIN D 25 HYDROXY (VIT D DEFICIENCY, FRACTURES): Vit D, 25-Hydroxy: 64.9 ng/mL (ref 30.0–100.0)

## 2018-03-01 NOTE — Progress Notes (Signed)
Office: [phone]  /  Fax: [phone]   HPI:   Chief Complaint: OBESITY Kristen Underwood is here to discuss her progress with her obesity treatment plan. She is on the Category 1 plan and is following her eating plan approximately 95 % of the time. She states she is exercising 0 minutes 0 times per week. Kristen Underwood continues to do very well with weight loss. She states her motivation is high but her job will become more stressful soon and she is worried this will cause weight gain.  Her weight is 164 lb (74.4 kg) today and has had a weight loss of 3 pounds over a period of 2 weeks since her last visit. She has lost 17 lbs since starting treatment with [name].  Vitamin D Deficiency Kristen Underwood has a diagnosis of vitamin D deficiency. She is stable on prescription Vit D, due for labs. She denies nausea, vomiting or muscle weakness.  Pre-Diabetes Kristen Underwood has a diagnosis of pre-diabetes based on her elevated Hgb A1c and was informed this puts her at greater risk of developing diabetes. She is not taking metformin and she is attempting to diet control, doing well on diet prescription and polyphagia improved. She denies nausea or hypoglycemia.  At risk for diabetes Kristen Underwood is at higher than average risk for developing diabetes due to her obesity and pre-diabetes. She currently denies polyuria or polydipsia.  ALLERGIES: No Known Allergies  MEDICATIONS: Current Outpatient Medications on File Prior to Visit  Medication Sig Dispense Refill  . aspirin 81 MG tablet Take 81 mg by mouth daily.    Marland Kitchen atorvastatin (LIPITOR) 20 MG tablet Take 1 tablet (20 mg total) by mouth daily. 90 tablet 1  . cholecalciferol (VITAMIN D) 1000 UNITS tablet Take 1,000 Units by mouth daily.      . fluticasone (FLONASE) 50 MCG/ACT nasal spray Place 2 sprays into both nostrils daily. 16 g 5  . lisinopril-hydrochlorothiazide (PRINZIDE,ZESTORETIC) 10-12.5 MG tablet Take 1 tablet by mouth daily. 90 tablet 3  . Omega-3 Fatty Acids (FISH OIL) 1000 MG  CAPS Take by mouth daily.    . Pyridoxine HCl (VITAMIN B-6) 500 MG tablet Take 500 mg by mouth daily.     No current facility-administered medications on file prior to visit.     PAST MEDICAL HISTORY: Past Medical History:  Diagnosis Date  . Back pain   . BRCA negative   . Cough, persistent   . Elevated cholesterol   . GERD (gastroesophageal reflux disease)   . Hyperlipidemia   . Hypertension    10/09/13:  pt denies  . Migraine    is the reason she takes BP med  . Neck pain     PAST SURGICAL HISTORY: Past Surgical History:  Procedure Laterality Date  . ABDOMINAL HYSTERECTOMY    . APPENDECTOMY    . TONSILLECTOMY AND ADENOIDECTOMY  1966    SOCIAL HISTORY: Social History   Tobacco Use  . Smoking status: Never Smoker  . Smokeless tobacco: Never Used  Substance Use Topics  . Alcohol use: Yes    Comment: RARE,SOCIAL  . Drug use: No    FAMILY HISTORY: Family History  Problem Relation Age of Onset  . Cancer Mother        BREAST.....AUNTS, GRANDMOTHER, Christy Sartorius  . Diabetes Mother   . Hyperlipidemia Mother   . Stroke Mother   . Depression Mother   . Obesity Mother   . Cancer Father        PROSTATE  . Hypertension Father   .  Mental illness Sister        had urosepsis--  and had schizo breakdown    ROS: Review of Systems  Constitutional: Positive for weight loss.  Gastrointestinal: Negative for nausea and vomiting.  Genitourinary: Negative for frequency.  Musculoskeletal:       Negative muscle weakness  Endo/Heme/Allergies: Negative for polydipsia.       Positive polyphagia Negative hypoglycemia    PHYSICAL EXAM: Blood pressure 95/66, pulse 64, temperature 98.1 F (36.7 C), temperature source Oral, height '5\' 2"'$  (1.575 m), weight 164 lb (74.4 kg), SpO2 98 %. Body mass index is 30 kg/m. Physical Exam  Constitutional: She is oriented to person, place, and time. She appears well-developed and well-nourished.  Cardiovascular: Normal rate.    Pulmonary/Chest: Effort normal.  Musculoskeletal: Normal range of motion.  Neurological: She is oriented to person, place, and time.  Skin: Skin is warm and dry.  Psychiatric: She has a normal mood and affect. Her behavior is normal.  Vitals reviewed.   RECENT LABS AND TESTS: BMET    Component Value Date/Time   NA 135 02/28/2018 0759   K 4.7 02/28/2018 0759   CL 101 02/28/2018 0759   CO2 23 02/28/2018 0759   GLUCOSE 98 02/28/2018 0759   GLUCOSE 110 (H) 02/05/2018 1050   BUN 30 (H) 02/28/2018 0759   CREATININE 1.17 (H) 02/28/2018 0759   CALCIUM 10.0 02/28/2018 0759   GFRNONAA 51 (L) 02/28/2018 0759   GFRAA 59 (L) 02/28/2018 0759   Lab Results  Component Value Date   HGBA1C 5.8 (H) 02/28/2018   HGBA1C 5.7 (H) 11/20/2017   Lab Results  Component Value Date   INSULIN 8.3 02/28/2018   INSULIN 11.2 11/20/2017   CBC    Component Value Date/Time   WBC 5.9 11/20/2017 1158   WBC 5.4 10/04/2013 1123   RBC 4.26 11/20/2017 1158   RBC 4.40 10/04/2013 1123   HGB 13.0 11/20/2017 1158   HCT 37.5 11/20/2017 1158   PLT 349.0 10/04/2013 1123   MCV 88 11/20/2017 1158   MCH 30.5 11/20/2017 1158   MCHC 34.7 11/20/2017 1158   MCHC 34.6 10/04/2013 1123   RDW 13.5 11/20/2017 1158   LYMPHSABS 1.6 11/20/2017 1158   MONOABS 0.3 10/04/2013 1123   EOSABS 0.2 11/20/2017 1158   BASOSABS 0.1 11/20/2017 1158   Iron/TIBC/Ferritin/ %Sat No results found for: IRON, TIBC, FERRITIN, IRONPCTSAT Lipid Panel     Component Value Date/Time   CHOL 186 02/05/2018 1050   CHOL 194 11/20/2017 1158   TRIG 48.0 02/05/2018 1050   HDL 77.00 02/05/2018 1050   HDL 97 11/20/2017 1158   CHOLHDL 2 02/05/2018 1050   VLDL 9.6 02/05/2018 1050   LDLCALC 99 02/05/2018 1050   LDLCALC 82 11/20/2017 1158   LDLDIRECT 151.8 07/22/2010 0904   Hepatic Function Panel     Component Value Date/Time   PROT 6.4 02/28/2018 0759   ALBUMIN 4.4 02/28/2018 0759   AST 27 02/28/2018 0759   ALT 30 02/28/2018 0759    ALKPHOS 68 02/28/2018 0759   BILITOT 0.4 02/28/2018 0759   BILIDIR 0.1 02/12/2015 1511   IBILI 0.4 09/29/2010 2010      Component Value Date/Time   TSH 1.880 11/20/2017 1158   TSH 1.30 10/04/2013 1123   TSH 1.34 04/24/2012 0804  Results for BLESSEN, KIMBROUGH (MRN [id number]) as of 03/01/2018 13:03  Ref. Range 02/28/2018 07:59  Vitamin D, 25-Hydroxy Latest Ref Range: 30.0 - 100.0 ng/mL 64.9    ASSESSMENT AND PLAN:  Vitamin D deficiency - Plan: VITAMIN D 25 Hydroxy (Vit-D Deficiency, Fractures), Vitamin D, Ergocalciferol, (DRISDOL) 50000 units CAPS capsule  Prediabetes - Plan: Hemoglobin A1c, Insulin, random, Comprehensive metabolic panel  At risk for diabetes mellitus  Class 1 obesity with serious comorbidity and body mass index (BMI) of 30.0 to 30.9 in adult, unspecified obesity type  PLAN:  Vitamin D Deficiency Kristen Underwood was informed that low vitamin D levels contributes to fatigue and are associated with obesity, breast, and colon cancer. Kristen Underwood agrees to continue taking prescription Vit D '@50'$ ,000 IU every week #4 and we will refill for 1 month. She will follow up for routine testing of vitamin D, at least 2-3 times per year. She was informed of the risk of over-replacement of vitamin D and agrees to not increase her dose unless she discusses this with [name] first. Kenika agrees to follow up with our clinic in 2 to 3 weeks.  Pre-Diabetes Kristen Underwood will continue to work on weight loss, diet, exercise, and decreasing simple carbohydrates in her diet to help decrease the risk of diabetes. We dicussed metformin including benefits and risks. She was informed that eating too many simple carbohydrates or too many calories at one sitting increases the likelihood of GI side effects. Kristen Underwood declined metformin for now and a prescription was not written today. Kristen Underwood agrees to follow up with our clinic in 2 to 3 weeks as directed to monitor her progress.  Diabetes risk counselling Kristen Underwood was given extended (15 minutes)  diabetes prevention counseling today. She is 62 y.o. female and has risk factors for diabetes including obesity and pre-diabetes. We discussed intensive lifestyle modifications today with an emphasis on weight loss as well as increasing exercise and decreasing simple carbohydrates in her diet.  Obesity Kristen Underwood is currently in the action stage of change. As such, her goal is to continue with weight loss efforts She has agreed to follow the Category 1 plan Kristen Underwood has been instructed to work up to a goal of 150 minutes of combined cardio and strengthening exercise per week for weight loss and overall health benefits. We discussed the following Behavioral Modification Strategies today: increasing lean protein intake, work on meal planning and easy cooking plans and emotional eating strategies   Vola has agreed to follow up with our clinic in 2 to 3 weeks. She was informed of the importance of frequent follow up visits to maximize her success with intensive lifestyle modifications for her multiple health conditions.   OBESITY BEHAVIORAL INTERVENTION VISIT  Today's visit was # 8 out of 22.  Starting weight: 181 lbs Starting date: 11/20/17 Today's weight : 164 lbs  Today's date: 02/28/2018 Total lbs lost to date: 17 (Patients must lose 7 lbs in the first 6 months to continue with counseling)   ASK: We discussed the diagnosis of obesity with Kristen Underwood today and Kristen Underwood agreed to give [name] permission to discuss obesity behavioral modification therapy today.  ASSESS: Kristen Underwood has the diagnosis of obesity and her BMI today is 29.99 Kristen Underwood is in the action stage of change   ADVISE: Kristen Underwood was educated on the multiple health risks of obesity as well as the benefit of weight loss to improve her health. She was advised of the need for long term treatment and the importance of lifestyle modifications.  AGREE: Multiple dietary modification options and treatment options were discussed and  Kristen Underwood agreed to the above  obesity treatment plan.  Kristen Underwood Durie, am acting as transcriptionist for Dennard Nip, MD  I  have reviewed the above documentation for accuracy and completeness, and I agree with the above. -Dennard Nip, MD

## 2018-03-21 ENCOUNTER — Ambulatory Visit (INDEPENDENT_AMBULATORY_CARE_PROVIDER_SITE_OTHER): Payer: 59 | Admitting: Family Medicine

## 2018-03-21 VITALS — BP 108/70 | HR 80 | Temp 98.6°F | Ht 62.0 in | Wt 160.0 lb

## 2018-03-21 DIAGNOSIS — E669 Obesity, unspecified: Secondary | ICD-10-CM | POA: Diagnosis not present

## 2018-03-21 DIAGNOSIS — E559 Vitamin D deficiency, unspecified: Secondary | ICD-10-CM

## 2018-03-21 DIAGNOSIS — Z6830 Body mass index (BMI) 30.0-30.9, adult: Secondary | ICD-10-CM

## 2018-03-21 DIAGNOSIS — Z9189 Other specified personal risk factors, not elsewhere classified: Secondary | ICD-10-CM | POA: Diagnosis not present

## 2018-03-21 MED ORDER — VITAMIN D (ERGOCALCIFEROL) 1.25 MG (50000 UNIT) PO CAPS: 50000.0000 [IU] | ORAL_CAPSULE | ORAL | 0 refills | Status: DC

## 2018-03-21 NOTE — Progress Notes (Signed)
Office: [phone]  /  Fax: [phone]   HPI:   Chief Complaint: OBESITY Kristen Underwood is here to discuss her progress with her obesity treatment plan. She is on the Category 1 plan and is following her eating plan approximately 80 % of the time. She states she is exercising 0 minutes 0 times per week. Kristen Underwood continues to do well with weight loss, but sh has had increased work stress and increased traveling. She is trying to increase lean protein and vegetables. She is not drinking liquids with calories. Her weight is 160 lb (72.6 kg) today and has had a weight loss of 4 pounds over a period of 3 weeks since her last visit. She has lost 21 lbs since starting treatment with [name].  Vitamin D deficiency Kristen Underwood has a diagnosis of vitamin D deficiency. She is currently taking vit D and denies nausea, vomiting or muscle weakness.  At risk for cardiovascular disease Kristen Underwood is at a higher than average risk for cardiovascular disease due to obesity. She currently denies any chest pain.  ALLERGIES: No Known Allergies  MEDICATIONS: Current Outpatient Medications on File Prior to Visit  Medication Sig Dispense Refill  . aspirin 81 MG tablet Take 81 mg by mouth daily.    Marland Kitchen atorvastatin (LIPITOR) 20 MG tablet Take 1 tablet (20 mg total) by mouth daily. 90 tablet 1  . cholecalciferol (VITAMIN D) 1000 UNITS tablet Take 1,000 Units by mouth daily.      . fluticasone (FLONASE) 50 MCG/ACT nasal spray Place 2 sprays into both nostrils daily. 16 g 5  . lisinopril-hydrochlorothiazide (PRINZIDE,ZESTORETIC) 10-12.5 MG tablet Take 1 tablet by mouth daily. 90 tablet 3  . Omega-3 Fatty Acids (FISH OIL) 1000 MG CAPS Take by mouth daily.    . Pyridoxine HCl (VITAMIN B-6) 500 MG tablet Take 500 mg by mouth daily.     No current facility-administered medications on file prior to visit.     PAST MEDICAL HISTORY: Past Medical History:  Diagnosis Date  . Back pain   . BRCA negative   . Cough, persistent   . Elevated  cholesterol   . GERD (gastroesophageal reflux disease)   . Hyperlipidemia   . Hypertension    10/09/13:  pt denies  . Migraine    is the reason she takes BP med  . Neck pain     PAST SURGICAL HISTORY: Past Surgical History:  Procedure Laterality Date  . ABDOMINAL HYSTERECTOMY    . APPENDECTOMY    . TONSILLECTOMY AND ADENOIDECTOMY  1966    SOCIAL HISTORY: Social History   Tobacco Use  . Smoking status: Never Smoker  . Smokeless tobacco: Never Used  Substance Use Topics  . Alcohol use: Yes    Comment: RARE,SOCIAL  . Drug use: No    FAMILY HISTORY: Family History  Problem Relation Age of Onset  . Cancer Mother        BREAST.....AUNTS, GRANDMOTHER, Christy Sartorius  . Diabetes Mother   . Hyperlipidemia Mother   . Stroke Mother   . Depression Mother   . Obesity Mother   . Cancer Father        PROSTATE  . Hypertension Father   . Mental illness Sister        had urosepsis--  and had schizo breakdown    ROS: Review of Systems  Constitutional: Positive for weight loss.  Cardiovascular: Negative for chest pain.  Gastrointestinal: Negative for nausea and vomiting.  Musculoskeletal:       Negative for muscle  weakness    PHYSICAL EXAM: Blood pressure 108/70, pulse 80, temperature 98.6 F (37 C), temperature source Oral, height 5' 2"  (1.575 m), weight 160 lb (72.6 kg), SpO2 96 %. Body mass index is 29.26 kg/m. Physical Exam  Constitutional: She is oriented to person, place, and time. She appears well-developed and well-nourished.  Cardiovascular: Normal rate.  Pulmonary/Chest: Effort normal.  Musculoskeletal: Normal range of motion.  Neurological: She is oriented to person, place, and time.  Skin: Skin is warm and dry.  Psychiatric: She has a normal mood and affect. Her behavior is normal.  Vitals reviewed.   RECENT LABS AND TESTS: BMET    Component Value Date/Time   NA 135 02/28/2018 0759   K 4.7 02/28/2018 0759   CL 101 02/28/2018 0759   CO2 23  02/28/2018 0759   GLUCOSE 98 02/28/2018 0759   GLUCOSE 110 (H) 02/05/2018 1050   BUN 30 (H) 02/28/2018 0759   CREATININE 1.17 (H) 02/28/2018 0759   CALCIUM 10.0 02/28/2018 0759   GFRNONAA 51 (L) 02/28/2018 0759   GFRAA 59 (L) 02/28/2018 0759   Lab Results  Component Value Date   HGBA1C 5.8 (H) 02/28/2018   HGBA1C 5.7 (H) 11/20/2017   Lab Results  Component Value Date   INSULIN 8.3 02/28/2018   INSULIN 11.2 11/20/2017   CBC    Component Value Date/Time   WBC 5.9 11/20/2017 1158   WBC 5.4 10/04/2013 1123   RBC 4.26 11/20/2017 1158   RBC 4.40 10/04/2013 1123   HGB 13.0 11/20/2017 1158   HCT 37.5 11/20/2017 1158   PLT 349.0 10/04/2013 1123   MCV 88 11/20/2017 1158   MCH 30.5 11/20/2017 1158   MCHC 34.7 11/20/2017 1158   MCHC 34.6 10/04/2013 1123   RDW 13.5 11/20/2017 1158   LYMPHSABS 1.6 11/20/2017 1158   MONOABS 0.3 10/04/2013 1123   EOSABS 0.2 11/20/2017 1158   BASOSABS 0.1 11/20/2017 1158   Iron/TIBC/Ferritin/ %Sat No results found for: IRON, TIBC, FERRITIN, IRONPCTSAT Lipid Panel     Component Value Date/Time   CHOL 186 02/05/2018 1050   CHOL 194 11/20/2017 1158   TRIG 48.0 02/05/2018 1050   HDL 77.00 02/05/2018 1050   HDL 97 11/20/2017 1158   CHOLHDL 2 02/05/2018 1050   VLDL 9.6 02/05/2018 1050   LDLCALC 99 02/05/2018 1050   LDLCALC 82 11/20/2017 1158   LDLDIRECT 151.8 07/22/2010 0904   Hepatic Function Panel     Component Value Date/Time   PROT 6.4 02/28/2018 0759   ALBUMIN 4.4 02/28/2018 0759   AST 27 02/28/2018 0759   ALT 30 02/28/2018 0759   ALKPHOS 68 02/28/2018 0759   BILITOT 0.4 02/28/2018 0759   BILIDIR 0.1 02/12/2015 1511   IBILI 0.4 09/29/2010 2010      Component Value Date/Time   TSH 1.880 11/20/2017 1158   TSH 1.30 10/04/2013 1123   TSH 1.34 04/24/2012 0804   Results for MEMORY, HEINRICHS (MRN [id number]) as of 03/21/2018 14:08  Ref. Range 02/28/2018 07:59  Vitamin D, 25-Hydroxy Latest Ref Range: 30.0 - 100.0 ng/mL 64.9   ASSESSMENT  AND PLAN: Vitamin D deficiency - Plan: Vitamin D, Ergocalciferol, (DRISDOL) 50000 units CAPS capsule  At risk for heart disease  Class 1 obesity with serious comorbidity and body mass index (BMI) of 30.0 to 30.9 in adult, unspecified obesity type - Starting BMI greater then 30  PLAN:  Vitamin D Deficiency Kristen Underwood was informed that low vitamin D levels contributes to fatigue and are associated with obesity,  breast, and colon cancer. She agrees to continue to take prescription Vit D @50 ,000 IU every week #4 with no refills and will follow up for routine testing of vitamin D, at least 2-3 times per year. She was informed of the risk of over-replacement of vitamin D and agrees to not increase her dose unless she discusses this with [name] first. Kristen Underwood agrees to follow up as directed.  Cardiovascular risk counseling Kristen Underwood was given extended (15 minutes) coronary artery disease prevention counseling today. She is 62 y.o. female and has risk factors for heart disease including obesity. We discussed intensive lifestyle modifications today with an emphasis on specific weight loss instructions and strategies. Pt was also informed of the importance of increasing exercise and decreasing saturated fats to help prevent heart disease.  Obesity Kristen Underwood is currently in the action stage of change. As such, her goal is to continue with weight loss efforts She has agreed to follow the Category 1 plan Kristen Underwood has been instructed to work up to a goal of 150 minutes of combined cardio and strengthening exercise per week for weight loss and overall health benefits. We discussed the following Behavioral Modification Strategies today: increasing lean protein intake and decreasing simple carbohydrates   Kristen Underwood has agreed to follow up with our clinic in 3 weeks. She was informed of the importance of frequent follow up visits to maximize her success with intensive lifestyle modifications for her multiple health conditions.   OBESITY  BEHAVIORAL INTERVENTION VISIT  Today's visit was # 9 out of 22.  Starting weight: 181 lbs Starting date: 11/20/17 Today's weight : 160 lbs  Today's date: 03/21/2018 Total lbs lost to date: 21 (Patients must lose 7 lbs in the first 6 months to continue with counseling)   ASK: We discussed the diagnosis of obesity with Kristen Underwood today and Kristen Underwood agreed to give [name] permission to discuss obesity behavioral modification therapy today.  ASSESS: Kristen Underwood has the diagnosis of obesity and her BMI today is 29.26 Kristen Underwood is in the action stage of change   ADVISE: Kristen Underwood was educated on the multiple health risks of obesity as well as the benefit of weight loss to improve her health. She was advised of the need for long term treatment and the importance of lifestyle modifications.  AGREE: Multiple dietary modification options and treatment options were discussed and  Kristen Underwood agreed to the above obesity treatment plan.  I, Doreene Nest, am acting as transcriptionist for Dennard Nip, MD  I have reviewed the above documentation for accuracy and completeness, and I agree with the above. -Dennard Nip, MD

## 2018-04-04 ENCOUNTER — Other Ambulatory Visit (INDEPENDENT_AMBULATORY_CARE_PROVIDER_SITE_OTHER): Payer: Self-pay | Admitting: Family Medicine

## 2018-04-04 DIAGNOSIS — E559 Vitamin D deficiency, unspecified: Secondary | ICD-10-CM

## 2018-04-07 ENCOUNTER — Encounter (INDEPENDENT_AMBULATORY_CARE_PROVIDER_SITE_OTHER): Payer: Self-pay | Admitting: Family Medicine

## 2018-04-09 ENCOUNTER — Encounter (INDEPENDENT_AMBULATORY_CARE_PROVIDER_SITE_OTHER): Payer: Self-pay | Admitting: Family Medicine

## 2018-04-09 NOTE — Telephone Encounter (Signed)
Please reschedule

## 2018-04-09 NOTE — Telephone Encounter (Signed)
Please cancel

## 2018-04-11 ENCOUNTER — Encounter (INDEPENDENT_AMBULATORY_CARE_PROVIDER_SITE_OTHER): Payer: Self-pay

## 2018-04-11 ENCOUNTER — Ambulatory Visit (INDEPENDENT_AMBULATORY_CARE_PROVIDER_SITE_OTHER): Payer: 59 | Admitting: Family Medicine

## 2018-04-17 ENCOUNTER — Ambulatory Visit (INDEPENDENT_AMBULATORY_CARE_PROVIDER_SITE_OTHER): Payer: 59 | Admitting: Physician Assistant

## 2018-04-17 VITALS — BP 109/73 | HR 66 | Temp 98.3°F | Ht 62.0 in | Wt 155.0 lb

## 2018-04-17 DIAGNOSIS — Z6830 Body mass index (BMI) 30.0-30.9, adult: Secondary | ICD-10-CM

## 2018-04-17 DIAGNOSIS — E669 Obesity, unspecified: Secondary | ICD-10-CM | POA: Diagnosis not present

## 2018-04-17 DIAGNOSIS — R7303 Prediabetes: Secondary | ICD-10-CM

## 2018-04-17 NOTE — Progress Notes (Signed)
Office: [phone]  /  Fax: [phone]   HPI:   Chief Complaint: OBESITY Kristen Underwood is here to discuss her progress with her obesity treatment plan. She is on the Category 1 plan and is following her eating plan approximately 90 % of the time. She states she is walking for 35 minutes 3 times per week. Kristen Underwood continue to do well with weight loss. She will be traveling and would like eating ideas. She requests a longer follow up time.  Her weight is 155 lb (70.3 kg) today and has had a weight loss of 5 pounds over a period of 4 weeks since her last visit. She has lost 26 lbs since starting treatment with [name].  Pre-Diabetes Kristen Underwood has a diagnosis of pre-diabetes based on her elevated Hgb A1c and was informed this puts her at greater risk of developing diabetes. A1c increased to 5.8, she has a strong family history of diabetes mellitus. She is not on metformin, declines today. She continues to work on diet and exercise to decrease risk of diabetes. She denies polyphagia, nausea, or hypoglycemia.  ALLERGIES: No Known Allergies  MEDICATIONS: Current Outpatient Medications on File Prior to Visit  Medication Sig Dispense Refill  . aspirin 81 MG tablet Take 81 mg by mouth daily.    Marland Kitchen atorvastatin (LIPITOR) 20 MG tablet Take 1 tablet (20 mg total) by mouth daily. 90 tablet 1  . cholecalciferol (VITAMIN D) 1000 UNITS tablet Take 1,000 Units by mouth daily.      . fluticasone (FLONASE) 50 MCG/ACT nasal spray Place 2 sprays into both nostrils daily. 16 g 5  . lisinopril-hydrochlorothiazide (PRINZIDE,ZESTORETIC) 10-12.5 MG tablet Take 1 tablet by mouth daily. 90 tablet 3  . Omega-3 Fatty Acids (FISH OIL) 1000 MG CAPS Take by mouth daily.    . Pyridoxine HCl (VITAMIN B-6) 500 MG tablet Take 500 mg by mouth daily.    . Vitamin D, Ergocalciferol, (DRISDOL) 50000 units CAPS capsule Take 1 capsule (50,000 Units total) by mouth every 7 (seven) days. 4 capsule 0   No current facility-administered medications on  file prior to visit.     PAST MEDICAL HISTORY: Past Medical History:  Diagnosis Date  . Back pain   . BRCA negative   . Cough, persistent   . Elevated cholesterol   . GERD (gastroesophageal reflux disease)   . Hyperlipidemia   . Hypertension    10/09/13:  pt denies  . Migraine    is the reason she takes BP med  . Neck pain     PAST SURGICAL HISTORY: Past Surgical History:  Procedure Laterality Date  . ABDOMINAL HYSTERECTOMY    . APPENDECTOMY    . TONSILLECTOMY AND ADENOIDECTOMY  1966    SOCIAL HISTORY: Social History   Tobacco Use  . Smoking status: Never Smoker  . Smokeless tobacco: Never Used  Substance Use Topics  . Alcohol use: Yes    Comment: RARE,SOCIAL  . Drug use: No    FAMILY HISTORY: Family History  Problem Relation Age of Onset  . Cancer Mother        BREAST.....AUNTS, GRANDMOTHER, Christy Sartorius  . Diabetes Mother   . Hyperlipidemia Mother   . Stroke Mother   . Depression Mother   . Obesity Mother   . Cancer Father        PROSTATE  . Hypertension Father   . Mental illness Sister        had urosepsis--  and had schizo breakdown    ROS: Review of  Systems  Constitutional: Positive for weight loss.  Gastrointestinal: Negative for nausea.  Endo/Heme/Allergies:       Negative polyphagia Negative hypoglycemia    PHYSICAL EXAM: Blood pressure 109/73, pulse 66, temperature 98.3 F (36.8 C), temperature source Oral, height 5' 2"  (1.575 m), weight 155 lb (70.3 kg), SpO2 97 %. Body mass index is 28.35 kg/m. Physical Exam  Constitutional: She is oriented to person, place, and time. She appears well-developed and well-nourished.  Cardiovascular: Normal rate.  Pulmonary/Chest: Effort normal.  Musculoskeletal: Normal range of motion.  Neurological: She is oriented to person, place, and time.  Skin: Skin is warm and dry.  Psychiatric: She has a normal mood and affect. Her behavior is normal.  Vitals reviewed.   RECENT LABS AND  TESTS: BMET    Component Value Date/Time   NA 135 02/28/2018 0759   K 4.7 02/28/2018 0759   CL 101 02/28/2018 0759   CO2 23 02/28/2018 0759   GLUCOSE 98 02/28/2018 0759   GLUCOSE 110 (H) 02/05/2018 1050   BUN 30 (H) 02/28/2018 0759   CREATININE 1.17 (H) 02/28/2018 0759   CALCIUM 10.0 02/28/2018 0759   GFRNONAA 51 (L) 02/28/2018 0759   GFRAA 59 (L) 02/28/2018 0759   Lab Results  Component Value Date   HGBA1C 5.8 (H) 02/28/2018   HGBA1C 5.7 (H) 11/20/2017   Lab Results  Component Value Date   INSULIN 8.3 02/28/2018   INSULIN 11.2 11/20/2017   CBC    Component Value Date/Time   WBC 5.9 11/20/2017 1158   WBC 5.4 10/04/2013 1123   RBC 4.26 11/20/2017 1158   RBC 4.40 10/04/2013 1123   HGB 13.0 11/20/2017 1158   HCT 37.5 11/20/2017 1158   PLT 349.0 10/04/2013 1123   MCV 88 11/20/2017 1158   MCH 30.5 11/20/2017 1158   MCHC 34.7 11/20/2017 1158   MCHC 34.6 10/04/2013 1123   RDW 13.5 11/20/2017 1158   LYMPHSABS 1.6 11/20/2017 1158   MONOABS 0.3 10/04/2013 1123   EOSABS 0.2 11/20/2017 1158   BASOSABS 0.1 11/20/2017 1158   Iron/TIBC/Ferritin/ %Sat No results found for: IRON, TIBC, FERRITIN, IRONPCTSAT Lipid Panel     Component Value Date/Time   CHOL 186 02/05/2018 1050   CHOL 194 11/20/2017 1158   TRIG 48.0 02/05/2018 1050   HDL 77.00 02/05/2018 1050   HDL 97 11/20/2017 1158   CHOLHDL 2 02/05/2018 1050   VLDL 9.6 02/05/2018 1050   LDLCALC 99 02/05/2018 1050   LDLCALC 82 11/20/2017 1158   LDLDIRECT 151.8 07/22/2010 0904   Hepatic Function Panel     Component Value Date/Time   PROT 6.4 02/28/2018 0759   ALBUMIN 4.4 02/28/2018 0759   AST 27 02/28/2018 0759   ALT 30 02/28/2018 0759   ALKPHOS 68 02/28/2018 0759   BILITOT 0.4 02/28/2018 0759   BILIDIR 0.1 02/12/2015 1511   IBILI 0.4 09/29/2010 2010      Component Value Date/Time   TSH 1.880 11/20/2017 1158   TSH 1.30 10/04/2013 1123   TSH 1.34 04/24/2012 0804    ASSESSMENT AND PLAN: Prediabetes  Class  1 obesity with serious comorbidity and body mass index (BMI) of 30.0 to 30.9 in adult, unspecified obesity type - Starting BMI greater then 30  PLAN:  Pre-Diabetes Kristen Underwood will continue to work on weight loss, diet, exercise, and decreasing simple carbohydrates in her diet to help decrease the risk of diabetes. We dicussed metformin including benefits and risks. She was informed that eating too many simple carbohydrates or  too many calories at one sitting increases the likelihood of GI side effects. Kristen Underwood declined metformin for now and a prescription was not written today. Kristen Underwood agrees to follow up with our clinic in 5 weeks as directed to monitor her progress.  We spent > than 50% of the 15 minute visit on the counseling as documented in the note.  Obesity Kristen Underwood is currently in the action stage of change. As such, her goal is to continue with weight loss efforts She has agreed to portion control better and make smarter food choices, such as increase vegetables and decrease simple carbohydrates  Kristen Underwood has been instructed to work up to a goal of 150 minutes of combined cardio and strengthening exercise per week for weight loss and overall health benefits. We discussed the following Behavioral Modification Strategies today: increasing lean protein intake and travel eating strategies    Kristen Underwood has agreed to follow up with our clinic in 5 weeks. She was informed of the importance of frequent follow up visits to maximize her success with intensive lifestyle modifications for her multiple health conditions.   OBESITY BEHAVIORAL INTERVENTION VISIT  Today's visit was # 10 out of 22.  Starting weight: 181 lbs Starting date: 11/20/17 Today's weight : 155 lbs  Today's date: 04/17/2018 Total lbs lost to date: 4 (Patients must lose 7 lbs in the first 6 months to continue with counseling)   ASK: We discussed the diagnosis of obesity with Kristen Underwood today and Kristen Underwood agreed to give [name] permission to discuss  obesity behavioral modification therapy today.  ASSESS: Kristen Underwood has the diagnosis of obesity and her BMI today is 28.34 Kristen Underwood is in the action stage of change   ADVISE: Kristen Underwood was educated on the multiple health risks of obesity as well as the benefit of weight loss to improve her health. She was advised of the need for long term treatment and the importance of lifestyle modifications.  AGREE: Multiple dietary modification options and treatment options were discussed and  Kristen Underwood agreed to the above obesity treatment plan.   Wilhemena Durie, am acting as transcriptionist for Lacy Duverney, PA-C I, Lacy Duverney [facility], have reviewed this note and agree with its content

## 2018-04-27 ENCOUNTER — Other Ambulatory Visit (INDEPENDENT_AMBULATORY_CARE_PROVIDER_SITE_OTHER): Payer: Self-pay | Admitting: Family Medicine

## 2018-04-27 DIAGNOSIS — E559 Vitamin D deficiency, unspecified: Secondary | ICD-10-CM

## 2018-05-17 ENCOUNTER — Encounter (INDEPENDENT_AMBULATORY_CARE_PROVIDER_SITE_OTHER): Payer: Self-pay | Admitting: Family Medicine

## 2018-05-22 ENCOUNTER — Ambulatory Visit (INDEPENDENT_AMBULATORY_CARE_PROVIDER_SITE_OTHER): Payer: 59 | Admitting: Physician Assistant

## 2018-05-22 VITALS — BP 132/77 | HR 55 | Temp 97.4°F | Ht 62.0 in | Wt 154.0 lb

## 2018-05-22 DIAGNOSIS — E559 Vitamin D deficiency, unspecified: Secondary | ICD-10-CM

## 2018-05-22 DIAGNOSIS — Z6830 Body mass index (BMI) 30.0-30.9, adult: Secondary | ICD-10-CM

## 2018-05-22 DIAGNOSIS — I10 Essential (primary) hypertension: Secondary | ICD-10-CM | POA: Diagnosis not present

## 2018-05-22 DIAGNOSIS — E669 Obesity, unspecified: Secondary | ICD-10-CM | POA: Diagnosis not present

## 2018-05-22 DIAGNOSIS — Z9189 Other specified personal risk factors, not elsewhere classified: Secondary | ICD-10-CM | POA: Diagnosis not present

## 2018-05-23 ENCOUNTER — Other Ambulatory Visit (INDEPENDENT_AMBULATORY_CARE_PROVIDER_SITE_OTHER): Payer: Self-pay | Admitting: Family Medicine

## 2018-05-23 ENCOUNTER — Encounter (INDEPENDENT_AMBULATORY_CARE_PROVIDER_SITE_OTHER): Payer: Self-pay | Admitting: Physician Assistant

## 2018-05-23 DIAGNOSIS — E559 Vitamin D deficiency, unspecified: Secondary | ICD-10-CM

## 2018-05-23 NOTE — Progress Notes (Signed)
Office: [phone]  /  Fax: [phone]   HPI:   Chief Complaint: OBESITY Kristen Underwood is here to discuss her progress with her obesity treatment plan. She is on the portion control better and make smarter food choices plan and is following her eating plan approximately 80 % of the time. She states she is walking 35 minutes 3 times per week. Kristen Underwood is retaining fluids. She has recently stopped taking her diuretic, due to low blood pressure. She states she follows the meal plan without deviation. Her weight is 154 lb (69.9 kg) today and has had a weight loss of 1 pound over a period of 5 weeks since her last visit. She has lost 27 lbs since starting treatment with [name].  Vitamin D deficiency Kristen Underwood has a diagnosis of vitamin D deficiency. She is currently taking vit D and denies nausea, vomiting or muscle weakness.  At risk for osteopenia and osteoporosis Kristen Underwood is at higher risk of osteopenia and osteoporosis due to vitamin D deficiency.   Hypertension Kristen Underwood is a 62 y.o. female with hypertension. Patient's Lisinopril-HCTZ was recently adjusted, however, she had dizzy spells and when she took her blood pressure at home, she states it was low, and she stopped taking her blood pressure medication for the past week. She states she has noticed some fluid retention. Kristen Underwood denies chest pain or shortness of breath on exertion. She is working weight loss to help control her blood pressure with the goal of decreasing her risk of heart attack and stroke. [name] blood pressure is stable.  ALLERGIES: No Known Allergies  MEDICATIONS: Current Outpatient Medications on File Prior to Visit  Medication Sig Dispense Refill  . aspirin 81 MG tablet Take 81 mg by mouth daily.    Marland Kitchen atorvastatin (LIPITOR) 20 MG tablet Take 1 tablet (20 mg total) by mouth daily. 90 tablet 1  . cholecalciferol (VITAMIN D) 1000 UNITS tablet Take 1,000 Units by mouth daily.      . fluticasone (FLONASE) 50 MCG/ACT nasal spray  Place 2 sprays into both nostrils daily. 16 g 5  . Omega-3 Fatty Acids (FISH OIL) 1000 MG CAPS Take by mouth daily.    . Pyridoxine HCl (VITAMIN B-6) 500 MG tablet Take 500 mg by mouth daily.    . Vitamin D, Ergocalciferol, (DRISDOL) 50000 units CAPS capsule Take 1 capsule (50,000 Units total) by mouth every 7 (seven) days. 4 capsule 0   No current facility-administered medications on file prior to visit.     PAST MEDICAL HISTORY: Past Medical History:  Diagnosis Date  . Back pain   . BRCA negative   . Cough, persistent   . Elevated cholesterol   . GERD (gastroesophageal reflux disease)   . Hyperlipidemia   . Hypertension    10/09/13:  pt denies  . Migraine    is the reason she takes BP med  . Neck pain     PAST SURGICAL HISTORY: Past Surgical History:  Procedure Laterality Date  . ABDOMINAL HYSTERECTOMY    . APPENDECTOMY    . TONSILLECTOMY AND ADENOIDECTOMY  1966    SOCIAL HISTORY: Social History   Tobacco Use  . Smoking status: Never Smoker  . Smokeless tobacco: Never Used  Substance Use Topics  . Alcohol use: Yes    Comment: RARE,SOCIAL  . Drug use: No    FAMILY HISTORY: Family History  Problem Relation Age of Onset  . Cancer Mother        BREAST.....AUNTS, GRANDMOTHER, Christy Sartorius  .  Diabetes Mother   . Hyperlipidemia Mother   . Stroke Mother   . Depression Mother   . Obesity Mother   . Cancer Father        PROSTATE  . Hypertension Father   . Mental illness Sister        had urosepsis--  and had schizo breakdown    ROS: Review of Systems  Constitutional: Positive for weight loss.  Respiratory: Negative for shortness of breath (on exertion).   Cardiovascular: Negative for chest pain.       Positive for fluid retention  Gastrointestinal: Negative for nausea and vomiting.  Musculoskeletal:       Negative for muscle weakness  Neurological: Positive for dizziness.    PHYSICAL EXAM: Blood pressure 132/77, pulse (!) 55, temperature (!) 97.4  F (36.3 C), temperature source Oral, height 5' 2" (1.575 m), weight 154 lb (69.9 kg), SpO2 99 %. Body mass index is 28.17 kg/m. Physical Exam  Constitutional: She is oriented to person, place, and time. She appears well-developed and well-nourished.  Cardiovascular: Bradycardia present.  Pulmonary/Chest: Effort normal.  Musculoskeletal: Normal range of motion.  Neurological: She is oriented to person, place, and time.  Skin: Skin is warm and dry.  Psychiatric: She has a normal mood and affect. Her behavior is normal.  Vitals reviewed.   RECENT LABS AND TESTS: BMET    Component Value Date/Time   NA 135 02/28/2018 0759   K 4.7 02/28/2018 0759   CL 101 02/28/2018 0759   CO2 23 02/28/2018 0759   GLUCOSE 98 02/28/2018 0759   GLUCOSE 110 (H) 02/05/2018 1050   BUN 30 (H) 02/28/2018 0759   CREATININE 1.17 (H) 02/28/2018 0759   CALCIUM 10.0 02/28/2018 0759   GFRNONAA 51 (L) 02/28/2018 0759   GFRAA 59 (L) 02/28/2018 0759   Lab Results  Component Value Date   HGBA1C 5.8 (H) 02/28/2018   HGBA1C 5.7 (H) 11/20/2017   Lab Results  Component Value Date   INSULIN 8.3 02/28/2018   INSULIN 11.2 11/20/2017   CBC    Component Value Date/Time   WBC 5.9 11/20/2017 1158   WBC 5.4 10/04/2013 1123   RBC 4.26 11/20/2017 1158   RBC 4.40 10/04/2013 1123   HGB 13.0 11/20/2017 1158   HCT 37.5 11/20/2017 1158   PLT 349.0 10/04/2013 1123   MCV 88 11/20/2017 1158   MCH 30.5 11/20/2017 1158   MCHC 34.7 11/20/2017 1158   MCHC 34.6 10/04/2013 1123   RDW 13.5 11/20/2017 1158   LYMPHSABS 1.6 11/20/2017 1158   MONOABS 0.3 10/04/2013 1123   EOSABS 0.2 11/20/2017 1158   BASOSABS 0.1 11/20/2017 1158   Iron/TIBC/Ferritin/ %Sat No results found for: IRON, TIBC, FERRITIN, IRONPCTSAT Lipid Panel     Component Value Date/Time   CHOL 186 02/05/2018 1050   CHOL 194 11/20/2017 1158   TRIG 48.0 02/05/2018 1050   HDL 77.00 02/05/2018 1050   HDL 97 11/20/2017 1158   CHOLHDL 2 02/05/2018 1050    VLDL 9.6 02/05/2018 1050   LDLCALC 99 02/05/2018 1050   LDLCALC 82 11/20/2017 1158   LDLDIRECT 151.8 07/22/2010 0904   Hepatic Function Panel     Component Value Date/Time   PROT 6.4 02/28/2018 0759   ALBUMIN 4.4 02/28/2018 0759   AST 27 02/28/2018 0759   ALT 30 02/28/2018 0759   ALKPHOS 68 02/28/2018 0759   BILITOT 0.4 02/28/2018 0759   BILIDIR 0.1 02/12/2015 1511   IBILI 0.4 09/29/2010 2010  Component Value Date/Time   TSH 1.880 11/20/2017 1158   TSH 1.30 10/04/2013 1123   TSH 1.34 04/24/2012 0804   Results for THAIS, SILBERSTEIN (MRN [id number]) as of 05/23/2018 07:19  Ref. Range 02/28/2018 07:59  Vitamin D, 25-Hydroxy Latest Ref Range: 30.0 - 100.0 ng/mL 64.9   ASSESSMENT AND PLAN: Essential hypertension  Vitamin D deficiency  At risk for osteoporosis  Class 1 obesity with serious comorbidity and body mass index (BMI) of 30.0 to 30.9 in adult, unspecified obesity type - Starting BMI greater then 30  PLAN:  Vitamin D Deficiency [place] was informed that low vitamin D levels contributes to fatigue and are associated with obesity, breast, and colon cancer. She agrees to continue to take prescription Vit D _0 ,000 IU every week #4 with no refills and will follow up for routine testing of vitamin D, at least 2-3 times per year. She was informed of the risk of over-replacement of vitamin D and agrees to not increase her dose unless she discusses this with [name] first.  At risk for osteopenia and osteoporosis Autum is at risk for osteopenia and osteoporosis due to her vitamin D deficiency. She was encouraged to take her vitamin D and follow her higher calcium diet and increase strengthening exercise to help strengthen her bones and decrease her risk of osteopenia and osteoporosis.  Hypertension We discussed sodium restriction, working on healthy weight loss, and a regular exercise program as the means to achieve improved blood pressure control. Kristen Underwood agreed with this plan and agreed to  follow up as directed. We will continue to monitor her blood pressure as well as her progress with the above lifestyle modifications. She will continue without her medications and keep a blood pressure log at home, and if blood pressure increases to 140/90, call our office or follow up with your PCP. Burnell will watch for signs of hypotension as she continues her lifestyle modifications.  Obesity Kristen Underwood is currently in the action stage of change. As such, her goal is to continue with weight loss efforts She has agreed to follow the Category 1 plan Kristen Underwood has been instructed to work up to a goal of 150 minutes of combined cardio and strengthening exercise per week for weight loss and overall health benefits. We discussed the following Behavioral Modification Strategies today: increasing lean protein intake and work on meal planning and easy cooking plans  Kristen Underwood has agreed to follow up with our clinic in 2 weeks. She was informed of the importance of frequent follow up visits to maximize her success with intensive lifestyle modifications for her multiple health conditions.   OBESITY BEHAVIORAL INTERVENTION VISIT  Today's visit was # 11 out of 22.  Starting weight: 181 lbs Starting date: 11/20/17 Today's weight : 154 lbs  Today's date: 05/22/2018 Total lbs lost to date: 60 (Patients must lose 7 lbs in the first 6 months to continue with counseling)   ASK: We discussed the diagnosis of obesity with Kristen Underwood today and Kristen Underwood agreed to give [name] permission to discuss obesity behavioral modification therapy today.  ASSESS: Kristen Underwood has the diagnosis of obesity and her BMI today is 28.16 Kristen Underwood is in the action stage of change   ADVISE: Kristen Underwood was educated on the multiple health risks of obesity as well as the benefit of weight loss to improve her health. She was advised of the need for long term treatment and the importance of lifestyle modifications.  AGREE: Multiple dietary modification options and  treatment options were discussed and  Kristen Underwood agreed to the above obesity treatment plan.   Corey Skains, am acting as transcriptionist for [employer], PA-C I, Lacy Duverney [facility], have reviewed this note and agree with its content

## 2018-05-24 ENCOUNTER — Encounter (INDEPENDENT_AMBULATORY_CARE_PROVIDER_SITE_OTHER): Payer: Self-pay | Admitting: Physician Assistant

## 2018-05-26 ENCOUNTER — Other Ambulatory Visit (INDEPENDENT_AMBULATORY_CARE_PROVIDER_SITE_OTHER): Payer: Self-pay | Admitting: Physician Assistant

## 2018-05-26 DIAGNOSIS — E559 Vitamin D deficiency, unspecified: Secondary | ICD-10-CM

## 2018-06-12 ENCOUNTER — Ambulatory Visit (INDEPENDENT_AMBULATORY_CARE_PROVIDER_SITE_OTHER): Payer: 59 | Admitting: Family Medicine

## 2018-06-12 VITALS — BP 123/78 | HR 55 | Temp 98.0°F | Ht 62.0 in | Wt 149.0 lb

## 2018-06-12 DIAGNOSIS — E669 Obesity, unspecified: Secondary | ICD-10-CM

## 2018-06-12 DIAGNOSIS — R42 Dizziness and giddiness: Secondary | ICD-10-CM | POA: Diagnosis not present

## 2018-06-12 DIAGNOSIS — Z6830 Body mass index (BMI) 30.0-30.9, adult: Secondary | ICD-10-CM | POA: Diagnosis not present

## 2018-06-12 DIAGNOSIS — Z9189 Other specified personal risk factors, not elsewhere classified: Secondary | ICD-10-CM

## 2018-06-12 NOTE — Progress Notes (Signed)
Office: [phone]  /  Fax: [phone]   HPI:   Chief Complaint: OBESITY Aftan is here to discuss her progress with her obesity treatment plan. She is on the Category 1 plan and is following her eating plan approximately 90 % of the time. She states she is walking and doing band exercises 30 minutes 5 times per week. Mikea is doing well with weight loss on the category 1 plan and is getting close to her goal weight of 145. Hunger is controlled. Her weight is 149 lb (67.6 kg) today and has had a weight loss of 5 pounds over a period of 3 weeks since her last visit. She has lost 32 lbs since starting treatment with [name].  Vertigo Jecenia has had a few episodes of feeling the room spin. She thought is was related to her blood pressure medications, but there was no change when she stopped. She states it is worse when she moves her head quickly.  At risk for cardiovascular disease Lamia is at a higher than average risk for cardiovascular disease due to obesity. She currently denies any chest pain.  ALLERGIES: No Known Allergies  MEDICATIONS: Current Outpatient Medications on File Prior to Visit  Medication Sig Dispense Refill  . aspirin 81 MG tablet Take 81 mg by mouth daily.    Marland Kitchen atorvastatin (LIPITOR) 20 MG tablet Take 1 tablet (20 mg total) by mouth daily. 90 tablet 1  . cholecalciferol (VITAMIN D) 1000 UNITS tablet Take 1,000 Units by mouth daily.      . fluticasone (FLONASE) 50 MCG/ACT nasal spray Place 2 sprays into both nostrils daily. 16 g 5  . Omega-3 Fatty Acids (FISH OIL) 1000 MG CAPS Take by mouth daily.    . Pyridoxine HCl (VITAMIN B-6) 500 MG tablet Take 500 mg by mouth daily.    . Vitamin D, Ergocalciferol, (DRISDOL) 50000 units CAPS capsule TAKE 1 CAPSULE (50,000 UNITS TOTAL) BY MOUTH EVERY 7 (SEVEN) DAYS. 4 capsule 0   No current facility-administered medications on file prior to visit.     PAST MEDICAL HISTORY: Past Medical History:  Diagnosis Date  . Back pain   .  BRCA negative   . Cough, persistent   . Elevated cholesterol   . GERD (gastroesophageal reflux disease)   . Hyperlipidemia   . Hypertension    10/09/13:  pt denies  . Migraine    is the reason she takes BP med  . Neck pain     PAST SURGICAL HISTORY: Past Surgical History:  Procedure Laterality Date  . ABDOMINAL HYSTERECTOMY    . APPENDECTOMY    . TONSILLECTOMY AND ADENOIDECTOMY  1966    SOCIAL HISTORY: Social History   Tobacco Use  . Smoking status: Never Smoker  . Smokeless tobacco: Never Used  Substance Use Topics  . Alcohol use: Yes    Comment: RARE,SOCIAL  . Drug use: No    FAMILY HISTORY: Family History  Problem Relation Age of Onset  . Cancer Mother        BREAST.....AUNTS, GRANDMOTHER, Christy Sartorius  . Diabetes Mother   . Hyperlipidemia Mother   . Stroke Mother   . Depression Mother   . Obesity Mother   . Cancer Father        PROSTATE  . Hypertension Father   . Mental illness Sister        had urosepsis--  and had schizo breakdown    ROS: Review of Systems  Constitutional: Positive for weight loss.  Cardiovascular: Negative  for chest pain.  Neurological:       Positive for vertigo    PHYSICAL EXAM: Blood pressure 123/78, pulse (!) 55, temperature 98 F (36.7 C), temperature source Oral, height 5' 2"  (1.575 m), weight 149 lb (67.6 kg), SpO2 98 %. Body mass index is 27.25 kg/m. Physical Exam  Constitutional: She is oriented to person, place, and time. She appears well-developed and well-nourished.  Cardiovascular: Normal rate.  Pulmonary/Chest: Effort normal.  Musculoskeletal: Normal range of motion.  Neurological: She is oriented to person, place, and time.  Skin: Skin is warm and dry.  Psychiatric: She has a normal mood and affect. Her behavior is normal.  Vitals reviewed.   RECENT LABS AND TESTS: BMET    Component Value Date/Time   NA 135 02/28/2018 0759   K 4.7 02/28/2018 0759   CL 101 02/28/2018 0759   CO2 23 02/28/2018 0759     GLUCOSE 98 02/28/2018 0759   GLUCOSE 110 (H) 02/05/2018 1050   BUN 30 (H) 02/28/2018 0759   CREATININE 1.17 (H) 02/28/2018 0759   CALCIUM 10.0 02/28/2018 0759   GFRNONAA 51 (L) 02/28/2018 0759   GFRAA 59 (L) 02/28/2018 0759   Lab Results  Component Value Date   HGBA1C 5.8 (H) 02/28/2018   HGBA1C 5.7 (H) 11/20/2017   Lab Results  Component Value Date   INSULIN 8.3 02/28/2018   INSULIN 11.2 11/20/2017   CBC    Component Value Date/Time   WBC 5.9 11/20/2017 1158   WBC 5.4 10/04/2013 1123   RBC 4.26 11/20/2017 1158   RBC 4.40 10/04/2013 1123   HGB 13.0 11/20/2017 1158   HCT 37.5 11/20/2017 1158   PLT 349.0 10/04/2013 1123   MCV 88 11/20/2017 1158   MCH 30.5 11/20/2017 1158   MCHC 34.7 11/20/2017 1158   MCHC 34.6 10/04/2013 1123   RDW 13.5 11/20/2017 1158   LYMPHSABS 1.6 11/20/2017 1158   MONOABS 0.3 10/04/2013 1123   EOSABS 0.2 11/20/2017 1158   BASOSABS 0.1 11/20/2017 1158   Iron/TIBC/Ferritin/ %Sat No results found for: IRON, TIBC, FERRITIN, IRONPCTSAT Lipid Panel     Component Value Date/Time   CHOL 186 02/05/2018 1050   CHOL 194 11/20/2017 1158   TRIG 48.0 02/05/2018 1050   HDL 77.00 02/05/2018 1050   HDL 97 11/20/2017 1158   CHOLHDL 2 02/05/2018 1050   VLDL 9.6 02/05/2018 1050   LDLCALC 99 02/05/2018 1050   LDLCALC 82 11/20/2017 1158   LDLDIRECT 151.8 07/22/2010 0904   Hepatic Function Panel     Component Value Date/Time   PROT 6.4 02/28/2018 0759   ALBUMIN 4.4 02/28/2018 0759   AST 27 02/28/2018 0759   ALT 30 02/28/2018 0759   ALKPHOS 68 02/28/2018 0759   BILITOT 0.4 02/28/2018 0759   BILIDIR 0.1 02/12/2015 1511   IBILI 0.4 09/29/2010 2010      Component Value Date/Time   TSH 1.880 11/20/2017 1158   TSH 1.30 10/04/2013 1123   TSH 1.34 04/24/2012 0804   Results for KAYNA, SUPPA (MRN [id number]) as of 06/12/2018 15:21  Ref. Range 02/28/2018 07:59  Vitamin D, 25-Hydroxy Latest Ref Range: 30.0 - 100.0 ng/mL 64.9   ASSESSMENT AND PLAN: Vertigo  - Plan: Ambulatory referral to ENT  At risk for heart disease  Class 1 obesity with serious comorbidity and body mass index (BMI) of 30.0 to 30.9 in adult, unspecified obesity type - Starting BMI greater then 30  PLAN:  Vertigo We will refer patient to ENT (referred to Dr.  Minna Merritts). Almendra will follow up with our clinic in 2 to 3 weeks.  Cardiovascular risk counseling Adrean was given extended (15 minutes) coronary artery disease prevention counseling today. She is 62 y.o. female and has risk factors for heart disease including obesity. We discussed intensive lifestyle modifications today with an emphasis on specific weight loss instructions and strategies. Pt was also informed of the importance of increasing exercise and decreasing saturated fats to help prevent heart disease.  Obesity Makaylie is currently in the action stage of change. As such, her goal is to continue with weight loss efforts She has agreed to follow the Category 1 plan [place] has been instructed to start toning exercises 30 minutes 3 times per week for weight loss and overall health benefits. We discussed the following Behavioral Modification Strategies today: increasing lean protein intake and decreasing simple carbohydrates   Yarnell has agreed to follow up with our clinic in 2 to 3 weeks. She was informed of the importance of frequent follow up visits to maximize her success with intensive lifestyle modifications for her multiple health conditions.   OBESITY BEHAVIORAL INTERVENTION VISIT  Today's visit was # 12 out of 22.  Starting weight: 181 lbs Starting date: 11/20/17 Today's weight : 149 lbs Today's date: 06/12/2018 Total lbs lost to date: 46    ASK: We discussed the diagnosis of obesity with [name] today and [name] agreed to give [name] permission to discuss obesity behavioral modification therapy today.  ASSESS: Rilda has the diagnosis of obesity and her BMI today is 27.25 Turner is in the action stage of  change   ADVISE: Tesia was educated on the multiple health risks of obesity as well as the benefit of weight loss to improve her health. She was advised of the need for long term treatment and the importance of lifestyle modifications.  AGREE: Multiple dietary modification options and treatment options were discussed and  Journe agreed to the above obesity treatment plan.  I, Doreene Nest, am acting as transcriptionist for Dennard Nip, MD  I have reviewed the above documentation for accuracy and completeness, and I agree with the above. -Dennard Nip, MD

## 2018-06-13 ENCOUNTER — Other Ambulatory Visit: Payer: Self-pay | Admitting: Family Medicine

## 2018-06-13 DIAGNOSIS — Z1231 Encounter for screening mammogram for malignant neoplasm of breast: Secondary | ICD-10-CM

## 2018-07-04 ENCOUNTER — Ambulatory Visit (INDEPENDENT_AMBULATORY_CARE_PROVIDER_SITE_OTHER): Payer: 59 | Admitting: Family Medicine

## 2018-07-04 VITALS — BP 125/79 | HR 56 | Temp 97.7°F | Ht 62.0 in | Wt 147.0 lb

## 2018-07-04 DIAGNOSIS — E7849 Other hyperlipidemia: Secondary | ICD-10-CM | POA: Diagnosis not present

## 2018-07-04 DIAGNOSIS — Z9189 Other specified personal risk factors, not elsewhere classified: Secondary | ICD-10-CM

## 2018-07-04 DIAGNOSIS — E559 Vitamin D deficiency, unspecified: Secondary | ICD-10-CM | POA: Diagnosis not present

## 2018-07-04 DIAGNOSIS — R7303 Prediabetes: Secondary | ICD-10-CM

## 2018-07-04 DIAGNOSIS — Z6830 Body mass index (BMI) 30.0-30.9, adult: Secondary | ICD-10-CM

## 2018-07-04 DIAGNOSIS — E669 Obesity, unspecified: Secondary | ICD-10-CM

## 2018-07-04 MED ORDER — VITAMIN D (ERGOCALCIFEROL) 1.25 MG (50000 UNIT) PO CAPS: 50000.0000 [IU] | ORAL_CAPSULE | ORAL | 0 refills | Status: DC

## 2018-07-04 NOTE — Progress Notes (Signed)
Office: [phone]  /  Fax: [phone]   HPI:   Chief Complaint: OBESITY Kristen Underwood is here to discuss her progress with her obesity treatment plan. She is on the Category 1 plan and is following her eating plan approximately 90 % of the time. She states she is exercising 0 minutes 0 times per week. [name] continues to do well with weight loss on her Category 1 plan. Her husband is supportive and she has improved with her emotional eating. Her weight is 147 lb (66.7 kg) today and has had a weight loss of 2 pounds over a period of 3 weeks since her last visit. She has lost 34 lbs since starting treatment with [name].  Vitamin D deficiency [name] has a diagnosis of vitamin D deficiency. She is still currently taking vit D and denies nausea, vomiting or muscle weakness. She is due for labs.  Pre-Diabetes [name] has a diagnosis of prediabetes based on her elevated Hgb A1c and was informed this puts her at greater risk of developing diabetes. She is attempting to improve pre-diabetes with her diet. She denies nausea or hypoglycemia.  At risk for diabetes [name] is at higher than average risk for developing diabetes due to her pre-diabetes and obesity. She currently denies polyuria or polydipsia.  Hyperlipidemia [name] has hyperlipidemia and has been trying to improve her cholesterol levels with intensive lifestyle modification including a low saturated fat diet, exercise and weight loss. She is taking Lipitor 8m. She denies any chest pain, claudication or myalgias.   ALLERGIES: No Known Allergies  MEDICATIONS: Current Outpatient Medications on File Prior to Visit  Medication Sig Dispense Refill  . aspirin 81 MG tablet Take 81 mg by mouth daily.    .Marland Kitchenatorvastatin (LIPITOR) 20 MG tablet Take 1 tablet (20 mg total) by mouth daily. 90 tablet 1  . cholecalciferol (VITAMIN D) 1000 UNITS tablet Take 1,000 Units by mouth daily.      . fluticasone (FLONASE) 50 MCG/ACT nasal spray Place 2 sprays into both  nostrils daily. 16 g 5  . Omega-3 Fatty Acids (FISH OIL) 1000 MG CAPS Take by mouth daily.    . Pyridoxine HCl (VITAMIN B-6) 500 MG tablet Take 500 mg by mouth daily.     No current facility-administered medications on file prior to visit.     PAST MEDICAL HISTORY: Past Medical History:  Diagnosis Date  . Back pain   . BRCA negative   . Cough, persistent   . Elevated cholesterol   . GERD (gastroesophageal reflux disease)   . Hyperlipidemia   . Hypertension    10/09/13:  pt denies  . Migraine    is the reason she takes BP med  . Neck pain     PAST SURGICAL HISTORY: Past Surgical History:  Procedure Laterality Date  . ABDOMINAL HYSTERECTOMY    . APPENDECTOMY    . TONSILLECTOMY AND ADENOIDECTOMY  1966    SOCIAL HISTORY: Social History   Tobacco Use  . Smoking status: Never Smoker  . Smokeless tobacco: Never Used  Substance Use Topics  . Alcohol use: Yes    Comment: RARE,SOCIAL  . Drug use: No    FAMILY HISTORY: Family History  Problem Relation Age of Onset  . Cancer Mother        BREAST.....AUNTS, GRANDMOTHER, GChristy Sartorius . Diabetes Mother   . Hyperlipidemia Mother   . Stroke Mother   . Depression Mother   . Obesity Mother   . Cancer Father  PROSTATE  . Hypertension Father   . Mental illness Sister        had urosepsis--  and had schizo breakdown    ROS: Review of Systems  Constitutional: Positive for weight loss.  Cardiovascular: Negative for chest pain and claudication.  Gastrointestinal: Negative for nausea and vomiting.  Genitourinary:       Negative for polyuria.  Musculoskeletal: Negative for myalgias.       Negative for muscle weakness.  Endo/Heme/Allergies: Negative for polydipsia.       Negative for hypoglycemia.    PHYSICAL EXAM: Blood pressure 125/79, pulse (!) 56, temperature 97.7 F (36.5 C), temperature source Oral, height _0  (1.575 m), weight 147 lb (66.7 kg), SpO2 99 %. Body mass index is 26.89 kg/m. Physical  Exam  Constitutional: She is oriented to person, place, and time. She appears well-developed and well-nourished.  Cardiovascular: Normal rate.  Pulmonary/Chest: Effort normal.  Musculoskeletal: Normal range of motion.  Neurological: She is oriented to person, place, and time.  Skin: Skin is warm and dry.  Psychiatric: She has a normal mood and affect. Her behavior is normal.  Vitals reviewed.   RECENT LABS AND TESTS: BMET    Component Value Date/Time   NA 135 02/28/2018 0759   K 4.7 02/28/2018 0759   CL 101 02/28/2018 0759   CO2 23 02/28/2018 0759   GLUCOSE 98 02/28/2018 0759   GLUCOSE 110 (H) 02/05/2018 1050   BUN 30 (H) 02/28/2018 0759   CREATININE 1.17 (H) 02/28/2018 0759   CALCIUM 10.0 02/28/2018 0759   GFRNONAA 51 (L) 02/28/2018 0759   GFRAA 59 (L) 02/28/2018 0759   Lab Results  Component Value Date   HGBA1C 5.8 (H) 02/28/2018   HGBA1C 5.7 (H) 11/20/2017   Lab Results  Component Value Date   INSULIN 8.3 02/28/2018   INSULIN 11.2 11/20/2017   CBC    Component Value Date/Time   WBC 5.9 11/20/2017 1158   WBC 5.4 10/04/2013 1123   RBC 4.26 11/20/2017 1158   RBC 4.40 10/04/2013 1123   HGB 13.0 11/20/2017 1158   HCT 37.5 11/20/2017 1158   PLT 349.0 10/04/2013 1123   MCV 88 11/20/2017 1158   MCH 30.5 11/20/2017 1158   MCHC 34.7 11/20/2017 1158   MCHC 34.6 10/04/2013 1123   RDW 13.5 11/20/2017 1158   LYMPHSABS 1.6 11/20/2017 1158   MONOABS 0.3 10/04/2013 1123   EOSABS 0.2 11/20/2017 1158   BASOSABS 0.1 11/20/2017 1158   Iron/TIBC/Ferritin/ %Sat No results found for: IRON, TIBC, FERRITIN, IRONPCTSAT Lipid Panel     Component Value Date/Time   CHOL 186 02/05/2018 1050   CHOL 194 11/20/2017 1158   TRIG 48.0 02/05/2018 1050   HDL 77.00 02/05/2018 1050   HDL 97 11/20/2017 1158   CHOLHDL 2 02/05/2018 1050   VLDL 9.6 02/05/2018 1050   LDLCALC 99 02/05/2018 1050   LDLCALC 82 11/20/2017 1158   LDLDIRECT 151.8 07/22/2010 0904   Hepatic Function Panel       Component Value Date/Time   PROT 6.4 02/28/2018 0759   ALBUMIN 4.4 02/28/2018 0759   AST 27 02/28/2018 0759   ALT 30 02/28/2018 0759   ALKPHOS 68 02/28/2018 0759   BILITOT 0.4 02/28/2018 0759   BILIDIR 0.1 02/12/2015 1511   IBILI 0.4 09/29/2010 2010      Component Value Date/Time   TSH 1.880 11/20/2017 1158   TSH 1.30 10/04/2013 1123   TSH 1.34 04/24/2012 0804   Results for JAYCIE, KREGEL (MRN [id number])  as of 07/04/2018 16:18  Ref. Range 02/28/2018 07:59  Vitamin D, 25-Hydroxy Latest Ref Range: 30.0 - 100.0 ng/mL 64.9   ASSESSMENT AND PLAN: Vitamin D deficiency - Plan: Vitamin B12, CBC With Differential, Folate, VITAMIN D 25 Hydroxy (Vit-D Deficiency, Fractures), Vitamin D, Ergocalciferol, (DRISDOL) 50000 units CAPS capsule  Prediabetes - Plan: Comprehensive metabolic panel, Hemoglobin A1c, Insulin, random, T3, T4, free, TSH  Other hyperlipidemia - Plan: Lipid Panel With LDL/HDL Ratio  At risk for diabetes mellitus  Class 1 obesity with serious comorbidity and body mass index (BMI) of 30.0 to 30.9 in adult, unspecified obesity type - Starting BMI greater then 30  PLAN:  Vitamin D Deficiency Chimere was informed that low vitamin D levels contributes to fatigue and are associated with obesity, breast, and colon cancer. She agrees to continue to take prescription Vit D _0 ,000 IU every week x 1 month with no refills and will follow up for routine testing of vitamin D, at least 2-3 times per year. She was informed of the risk of over-replacement of vitamin D and agrees to not increase her dose unless she discusses this with [name] first. We will check labs today. She agrees to follow up in 2 to 3 weeks.  Pre-Diabetes Ysabela will continue to work on weight loss, exercise, and decreasing simple carbohydrates in her diet to help decrease the risk of diabetes. She was informed that eating too many simple carbohydrates or too many calories at one sitting increases the likelihood of GI side  effects. Thayer is going to continue with her diet. [name] agreed to follow up with [name] as directed to monitor herprogress.  Diabetes risk counselling Blasa was given extended (15 minutes) diabetes prevention counseling today. She is 62 y.o. female and has risk factors for diabetes including pre-diabetes and obesity. We discussed intensive lifestyle modifications today with an emphasis on weight loss as well as increasing exercise and decreasing simple carbohydrates in her diet.  Hyperlipidemia Brigid was informed of the American Heart Association Guidelines emphasizing intensive lifestyle modifications as the first line treatment for hyperlipidemia. We discussed many lifestyle modifications today in depth, and Madailein will continue to work on decreasing saturated fats such as fatty red meat, butter and many fried foods. She will also increase vegetables and lean protein in her diet and continue to work on exercise and weight loss efforts. She agrees to continue her diet and Lipitor medication.  Obesity Naryah is currently in the action stage of change. As such, her goal is to continue with weight loss efforts. She has agreed to follow the Category 1 plan. Lasya has been instructed to work up to a goal of 150 minutes of combined cardio and strengthening exercise per week for weight loss and overall health benefits. We discussed the following Behavioral Modification Strategies today: increasing lean protein intake and decreasing simple carbohydrates .  Meiya has agreed to follow up with our clinic in 2 to 3 weeks. She was informed of the importance of frequent follow up visits to maximize her success with intensive lifestyle modifications for her multiple health conditions.   OBESITY BEHAVIORAL INTERVENTION VISIT  Today's visit was # 19  Starting weight: 181 lbs Starting date: 11/20/17 Today's weight : 147 lb (66.7 kg)  Today's date: 07/04/2018 Total lbs lost to date: 34    ASK: We discussed the  diagnosis of obesity with [name] today and [name] agreed to give [name] permission to discuss obesity behavioral modification therapy today.  ASSESS: Jaylah has the diagnosis  of obesity and her BMI today is 26.88 Ifrah is in the action stage of change.  ADVISE: Laurena was educated on the multiple health risks of obesity as well as the benefit of weight loss to improve her health. She was advised of the need for long term treatment and the importance of lifestyle modifications to improve her current health and to decrease her risk of future health problems.  AGREE: Multiple dietary modification options and treatment options were discussed and  Giannah agreed to follow the recommendations documented in the above note.  ARRANGE: Aalaysia was educated on the importance of frequent visits to treat obesity as outlined per CMS and USPSTF guidelines and agreed to schedule her next follow up appointment today.  I, Marcille Blanco, am acting as transcriptionist for Starlyn Skeans, MD  I have reviewed the above documentation for accuracy and completeness, and I agree with the above. -Dennard Nip, MD

## 2018-07-05 LAB — LIPID PANEL WITH LDL/HDL RATIO
Cholesterol, Total: 213 mg/dL — ABNORMAL HIGH (ref 100–199)
HDL: 72 mg/dL (ref >39)
LDL CALC: 126 mg/dL — AB (ref 0–99)
LDL/HDL RATIO: 1.8 ratio (ref 0.0–3.2)
TRIGLYCERIDES: 75 mg/dL (ref 0–149)
VLDL Cholesterol Cal: 15 mg/dL (ref 5–40)

## 2018-07-05 LAB — COMPREHENSIVE METABOLIC PANEL
A/G RATIO: 2.2 (ref 1.2–2.2)
ALK PHOS: 83 IU/L (ref 39–117)
ALT: 32 IU/L (ref 0–32)
AST: 25 IU/L (ref 0–40)
Albumin: 4.6 g/dL (ref 3.6–4.8)
BILIRUBIN TOTAL: 0.4 mg/dL (ref 0.0–1.2)
BUN/Creatinine Ratio: 22 (ref 12–28)
BUN: 19 mg/dL (ref 8–27)
CHLORIDE: 103 mmol/L (ref 96–106)
CO2: 22 mmol/L (ref 20–29)
Calcium: 10.3 mg/dL (ref 8.7–10.3)
Creatinine, Ser: 0.88 mg/dL (ref 0.57–1.00)
GFR calc Af Amer: 83 mL/min/{1.73_m2} (ref >59)
GFR calc non Af Amer: 72 mL/min/{1.73_m2} (ref >59)
GLOBULIN, TOTAL: 2.1 g/dL (ref 1.5–4.5)
Glucose: 94 mg/dL (ref 65–99)
POTASSIUM: 4.7 mmol/L (ref 3.5–5.2)
SODIUM: 142 mmol/L (ref 134–144)
Total Protein: 6.7 g/dL (ref 6.0–8.5)

## 2018-07-05 LAB — CBC WITH DIFFERENTIAL
BASOS: 1 %
Basophils Absolute: 0.1 10*3/uL (ref 0.0–0.2)
EOS (ABSOLUTE): 0.3 10*3/uL (ref 0.0–0.4)
Eos: 6 %
HEMATOCRIT: 42.7 % (ref 34.0–46.6)
Hemoglobin: 14.5 g/dL (ref 11.1–15.9)
Immature Grans (Abs): 0 10*3/uL (ref 0.0–0.1)
Immature Granulocytes: 0 %
LYMPHS ABS: 1.3 10*3/uL (ref 0.7–3.1)
Lymphs: 27 %
MCH: 29.9 pg (ref 26.6–33.0)
MCHC: 34 g/dL (ref 31.5–35.7)
MCV: 88 fL (ref 79–97)
MONOS ABS: 0.3 10*3/uL (ref 0.1–0.9)
Monocytes: 6 %
NEUTROS PCT: 60 %
Neutrophils Absolute: 2.8 10*3/uL (ref 1.4–7.0)
RBC: 4.85 x10E6/uL (ref 3.77–5.28)
RDW: 14.4 % (ref 12.3–15.4)
WBC: 4.8 10*3/uL (ref 3.4–10.8)

## 2018-07-05 LAB — TSH: TSH: 2 u[IU]/mL (ref 0.450–4.500)

## 2018-07-05 LAB — FOLATE: FOLATE: 6.1 ng/mL (ref >3.0)

## 2018-07-05 LAB — HEMOGLOBIN A1C
Est. average glucose Bld gHb Est-mCnc: 111 mg/dL
Hgb A1c MFr Bld: 5.5 % (ref 4.8–5.6)

## 2018-07-05 LAB — VITAMIN D 25 HYDROXY (VIT D DEFICIENCY, FRACTURES): VIT D 25 HYDROXY: 65.8 ng/mL (ref 30.0–100.0)

## 2018-07-05 LAB — VITAMIN B12: Vitamin B-12: >2000 pg/mL — ABNORMAL HIGH (ref 232–1245)

## 2018-07-05 LAB — T3: T3 TOTAL: 112 ng/dL (ref 71–180)

## 2018-07-05 LAB — T4, FREE: FREE T4: 1.3 ng/dL (ref 0.82–1.77)

## 2018-07-05 LAB — INSULIN, RANDOM: INSULIN: 8.5 u[IU]/mL (ref 2.6–24.9)

## 2018-07-26 ENCOUNTER — Ambulatory Visit (INDEPENDENT_AMBULATORY_CARE_PROVIDER_SITE_OTHER): Payer: 59 | Admitting: Family Medicine

## 2018-07-26 VITALS — BP 125/76 | HR 76 | Temp 98.0°F | Ht 62.0 in | Wt 145.0 lb

## 2018-07-26 DIAGNOSIS — Z6830 Body mass index (BMI) 30.0-30.9, adult: Secondary | ICD-10-CM

## 2018-07-26 DIAGNOSIS — Z9189 Other specified personal risk factors, not elsewhere classified: Secondary | ICD-10-CM | POA: Diagnosis not present

## 2018-07-26 DIAGNOSIS — E7849 Other hyperlipidemia: Secondary | ICD-10-CM | POA: Diagnosis not present

## 2018-07-26 DIAGNOSIS — E669 Obesity, unspecified: Secondary | ICD-10-CM | POA: Diagnosis not present

## 2018-07-26 MED ORDER — COQ10 400 MG PO CAPS: 1.0000 | ORAL_CAPSULE | Freq: Every day | ORAL | 0 refills | Status: AC

## 2018-07-30 NOTE — Progress Notes (Signed)
Office: [phone]  /  Fax: [phone]   HPI:   Chief Complaint: OBESITY Kristen Underwood is here to discuss her progress with her obesity treatment plan. She is on the Category 1 plan and is following her eating plan approximately 80 % of the time. She states she is walking for 30-40 minutes 5 times per week. Kristen Underwood continues to do well with weight loss on the Category 1 plan. She states her hunger is controlled and she is doing well eating all her food.  Her weight is 145 lb (65.8 kg) today and has had a weight loss of 2 pounds over a period of 3 weeks since her last visit. She has lost 36 lbs since starting treatment with [name].  Hyperlipidemia Kristen Underwood has hyperlipidemia and has been trying to improve her cholesterol levels with intensive lifestyle modification including a low saturated fat diet, exercise and weight loss. Kristen Underwood is on Lipitor 20 mg but LDL is elevated even with weight loss. She was told by her ENT doctor that Lipitor may cause confusion and memory loss. She denies any chest pain, claudication or myalgias.  At risk for cardiovascular disease Kristen Underwood is at a higher than average risk for cardiovascular disease due to obesity and hyperlipidemia. She currently denies any chest pain.  ALLERGIES: No Known Allergies  MEDICATIONS: Current Outpatient Medications on File Prior to Visit  Medication Sig Dispense Refill  . aspirin 81 MG tablet Take 81 mg by mouth daily.    Kristen Underwood Kitchen atorvastatin (LIPITOR) 20 MG tablet Take 1 tablet (20 mg total) by mouth daily. 90 tablet 1  . cholecalciferol (VITAMIN D) 1000 UNITS tablet Take 1,000 Units by mouth daily.      . fluticasone (FLONASE) 50 MCG/ACT nasal spray Place 2 sprays into both nostrils daily. 16 g 5  . Omega-3 Fatty Acids (FISH OIL) 1000 MG CAPS Take by mouth daily.    . Pyridoxine HCl (VITAMIN B-6) 500 MG tablet Take 500 mg by mouth daily.    . Vitamin D, Ergocalciferol, (DRISDOL) 50000 units CAPS capsule Take 1 capsule (50,000 Units total) by mouth  every 7 (seven) days. 4 capsule 0   No current facility-administered medications on file prior to visit.     PAST MEDICAL HISTORY: Past Medical History:  Diagnosis Date  . Back pain   . BRCA negative   . Cough, persistent   . Elevated cholesterol   . GERD (gastroesophageal reflux disease)   . Hyperlipidemia   . Hypertension    10/09/13:  pt denies  . Migraine    is the reason she takes BP med  . Neck pain     PAST SURGICAL HISTORY: Past Surgical History:  Procedure Laterality Date  . ABDOMINAL HYSTERECTOMY    . APPENDECTOMY    . TONSILLECTOMY AND ADENOIDECTOMY  1966    SOCIAL HISTORY: Social History   Tobacco Use  . Smoking status: Never Smoker  . Smokeless tobacco: Never Used  Substance Use Topics  . Alcohol use: Yes    Comment: RARE,SOCIAL  . Drug use: No    FAMILY HISTORY: Family History  Problem Relation Age of Onset  . Cancer Mother        BREAST.....AUNTS, GRANDMOTHER, Christy Sartorius  . Diabetes Mother   . Hyperlipidemia Mother   . Stroke Mother   . Depression Mother   . Obesity Mother   . Cancer Father        PROSTATE  . Hypertension Father   . Mental illness Sister  had urosepsis--  and had schizo breakdown    ROS: Review of Systems  Constitutional: Positive for weight loss.  Cardiovascular: Negative for chest pain and claudication.  Musculoskeletal: Negative for myalgias.    PHYSICAL EXAM: Blood pressure 125/76, pulse 76, temperature 98 F (36.7 C), temperature source Oral, height 5' 2"  (1.575 m), weight 145 lb (65.8 kg), SpO2 97 %. Body mass index is 26.52 kg/m. Physical Exam  Constitutional: She is oriented to person, place, and time. She appears well-developed and well-nourished.  Cardiovascular: Normal rate.  Pulmonary/Chest: Effort normal.  Musculoskeletal: Normal range of motion.  Neurological: She is oriented to person, place, and time.  Skin: Skin is warm and dry.  Psychiatric: She has a normal mood and affect. Her  behavior is normal.  Vitals reviewed.   RECENT LABS AND TESTS: BMET    Component Value Date/Time   NA 142 07/04/2018 1022   K 4.7 07/04/2018 1022   CL 103 07/04/2018 1022   CO2 22 07/04/2018 1022   GLUCOSE 94 07/04/2018 1022   GLUCOSE 110 (H) 02/05/2018 1050   BUN 19 07/04/2018 1022   CREATININE 0.88 07/04/2018 1022   CALCIUM 10.3 07/04/2018 1022   GFRNONAA 72 07/04/2018 1022   GFRAA 83 07/04/2018 1022   Lab Results  Component Value Date   HGBA1C 5.5 07/04/2018   HGBA1C 5.8 (H) 02/28/2018   HGBA1C 5.7 (H) 11/20/2017   Lab Results  Component Value Date   INSULIN 8.5 07/04/2018   INSULIN 8.3 02/28/2018   INSULIN 11.2 11/20/2017   CBC    Component Value Date/Time   WBC 4.8 07/04/2018 1022   WBC 5.4 10/04/2013 1123   RBC 4.85 07/04/2018 1022   RBC 4.40 10/04/2013 1123   HGB 14.5 07/04/2018 1022   HCT 42.7 07/04/2018 1022   PLT 349.0 10/04/2013 1123   MCV 88 07/04/2018 1022   MCH 29.9 07/04/2018 1022   MCHC 34.0 07/04/2018 1022   MCHC 34.6 10/04/2013 1123   RDW 14.4 07/04/2018 1022   LYMPHSABS 1.3 07/04/2018 1022   MONOABS 0.3 10/04/2013 1123   EOSABS 0.3 07/04/2018 1022   BASOSABS 0.1 07/04/2018 1022   Iron/TIBC/Ferritin/ %Sat No results found for: IRON, TIBC, FERRITIN, IRONPCTSAT Lipid Panel     Component Value Date/Time   CHOL 213 (H) 07/04/2018 1022   TRIG 75 07/04/2018 1022   HDL 72 07/04/2018 1022   CHOLHDL 2 02/05/2018 1050   VLDL 9.6 02/05/2018 1050   LDLCALC 126 (H) 07/04/2018 1022   LDLDIRECT 151.8 07/22/2010 0904   Hepatic Function Panel     Component Value Date/Time   PROT 6.7 07/04/2018 1022   ALBUMIN 4.6 07/04/2018 1022   AST 25 07/04/2018 1022   ALT 32 07/04/2018 1022   ALKPHOS 83 07/04/2018 1022   BILITOT 0.4 07/04/2018 1022   BILIDIR 0.1 02/12/2015 1511   IBILI 0.4 09/29/2010 2010      Component Value Date/Time   TSH 2.000 07/04/2018 1022   TSH 1.880 11/20/2017 1158   TSH 1.30 10/04/2013 1123    ASSESSMENT AND  PLAN: Other hyperlipidemia - Plan: Coenzyme Q10 (COQ10) 400 MG CAPS  At risk for heart disease  Class 1 obesity with serious comorbidity and body mass index (BMI) of 30.0 to 30.9 in adult, unspecified obesity type - Starting BMI greater then 30  PLAN:  Hyperlipidemia Kristen Underwood was informed of the American Heart Association Guidelines emphasizing intensive lifestyle modifications as the first line treatment for hyperlipidemia. We discussed many lifestyle modifications today in  depth, and Kristen Underwood will continue to work on decreasing saturated fats such as fatty red meat, butter and many fried foods. She will also increase vegetables and lean protein in her diet and continue to work on exercise and weight loss efforts. Kristen Underwood agrees to start OTC CoQ10 400 mg q daily #30, and she agrees to continue taking Lipitor 20 mg qhs. She was advised that I was not aware of this potential side effects, but I would look into this and get back to her in 3 weeks. Kristen Underwood agrees to follow up with our clinic in 3 weeks.  Cardiovascular risk counselling Kristen Underwood was given extended (15 minutes) coronary artery disease prevention counseling today. She is 62 y.o. female and has risk factors for heart disease including obesity and hyperlipidemia. We discussed intensive lifestyle modifications today with an emphasis on specific weight loss instructions and strategies. Pt was also informed of the importance of increasing exercise and decreasing saturated fats to help prevent heart disease.  Obesity Kristen Underwood is currently in the action stage of change. As such, her goal is to continue with weight loss efforts She has agreed to follow the Category 1 plan Kristen Underwood has been instructed to work up to a goal of 150 minutes of combined cardio and strengthening exercise per week for weight loss and overall health benefits. We discussed the following Behavioral Modification Strategies today: increasing lean protein intake and travel eating strategies     Marque has agreed to follow up with our clinic in 3 weeks. She was informed of the importance of frequent follow up visits to maximize her success with intensive lifestyle modifications for her multiple health conditions.   OBESITY BEHAVIORAL INTERVENTION VISIT  Today's visit was # 14   Starting weight: 181 lbs Starting date: 11/20/17 Today's weight : 145 lbs Today's date: 07/26/2018 Total lbs lost to date: 71    ASK: We discussed the diagnosis of obesity with Kristen Underwood today and Kristen Underwood agreed to give [name] permission to discuss obesity behavioral modification therapy today.  ASSESS: Kristen Underwood has the diagnosis of obesity and her BMI today is 26.51 Kristen Underwood is in the action stage of change   ADVISE: Kristen Underwood was educated on the multiple health risks of obesity as well as the benefit of weight loss to improve her health. She was advised of the need for long term treatment and the importance of lifestyle modifications to improve her current health and to decrease her risk of future health problems.  AGREE: Multiple dietary modification options and treatment options were discussed and  Kristen Underwood agreed to follow the recommendations documented in the above note.  ARRANGE: Kristen Underwood was educated on the importance of frequent visits to treat obesity as outlined per CMS and USPSTF guidelines and agreed to schedule her next follow up appointment today.  I, Trixie Dredge, am acting as transcriptionist for Dennard Nip, MD  I have reviewed the above documentation for accuracy and completeness, and I agree with the above. -Dennard Nip, MD

## 2018-07-31 ENCOUNTER — Other Ambulatory Visit (INDEPENDENT_AMBULATORY_CARE_PROVIDER_SITE_OTHER): Payer: Self-pay | Admitting: Family Medicine

## 2018-07-31 DIAGNOSIS — E559 Vitamin D deficiency, unspecified: Secondary | ICD-10-CM

## 2018-08-14 ENCOUNTER — Ambulatory Visit
Admission: RE | Admit: 2018-08-14 | Discharge: 2018-08-14 | Disposition: A | Discharge status: Home / Self Care | Payer: 59 | Source: Ambulatory Visit | Attending: Family Medicine | Admitting: Family Medicine

## 2018-08-14 DIAGNOSIS — Z1231 Encounter for screening mammogram for malignant neoplasm of breast: Secondary | ICD-10-CM

## 2018-08-22 ENCOUNTER — Ambulatory Visit (INDEPENDENT_AMBULATORY_CARE_PROVIDER_SITE_OTHER): Payer: 59 | Admitting: Family Medicine

## 2018-08-22 VITALS — BP 132/80 | HR 56 | Ht 62.0 in | Wt 142.0 lb

## 2018-08-22 DIAGNOSIS — E669 Obesity, unspecified: Secondary | ICD-10-CM | POA: Diagnosis not present

## 2018-08-22 DIAGNOSIS — Z9189 Other specified personal risk factors, not elsewhere classified: Secondary | ICD-10-CM | POA: Diagnosis not present

## 2018-08-22 DIAGNOSIS — Z6830 Body mass index (BMI) 30.0-30.9, adult: Secondary | ICD-10-CM

## 2018-08-22 DIAGNOSIS — E7849 Other hyperlipidemia: Secondary | ICD-10-CM | POA: Diagnosis not present

## 2018-08-22 DIAGNOSIS — E559 Vitamin D deficiency, unspecified: Secondary | ICD-10-CM | POA: Diagnosis not present

## 2018-08-22 MED ORDER — VITAMIN D (ERGOCALCIFEROL) 1.25 MG (50000 UNIT) PO CAPS: 50000.0000 [IU] | ORAL_CAPSULE | ORAL | 0 refills | Status: DC

## 2018-08-27 NOTE — Progress Notes (Signed)
Office: [phone]  /  Fax: [phone]   HPI:   Chief Complaint: OBESITY Kristen Underwood is here to discuss her progress with her obesity treatment plan. She is on the Category 1 plan and is following her eating plan approximately 50 % of the time. She states she is doing resistance and cardio for 30-75 minutes 7 times per week. Kristen Underwood has been on vacation for 10 days and she is surprised she lost weight. She fell and broke her nose on vacation, 2 hours of dizziness from chronic sinus infection. She likes Category 1 meal plan, and she is eating all protein on the plan.  Her weight is 142 lb (64.4 kg) today and has had a weight loss of 3 pounds over a period of 4 weeks since her last visit. She has lost 39 lbs since starting treatment with [name].  Vitamin D Deficiency Kristen Underwood has a diagnosis of vitamin D deficiency. She is stable on prescription Vit D and denies nausea, vomiting or muscle weakness.  At risk for osteopenia and osteoporosis Kristen Underwood is at higher risk of osteopenia and osteoporosis due to vitamin D deficiency.   Hyperlipidemia Kristen Underwood has hyperlipidemia and has been trying to improve her cholesterol levels with intensive lifestyle modification including a low saturated fat diet, exercise and weight loss. She feels CoQ10 has improved memory issues slightly. Her LDL level is not at goal, last level at 126 on 07/04/18. She denies any chest pain, claudication or myalgias.  ALLERGIES: No Known Allergies  MEDICATIONS: Current Outpatient Medications on File Prior to Visit  Medication Sig Dispense Refill  . aspirin 81 MG tablet Take 81 mg by mouth daily.    Marland Kitchen atorvastatin (LIPITOR) 20 MG tablet Take 1 tablet (20 mg total) by mouth daily. 90 tablet 1  . cholecalciferol (VITAMIN D) 1000 UNITS tablet Take 1,000 Units by mouth daily.      . Coenzyme Q10 (COQ10) 400 MG CAPS Take 1 tablet by mouth daily. 30 capsule 0  . fluticasone (FLONASE) 50 MCG/ACT nasal spray Place 2 sprays into both nostrils daily. 16  g 5  . Omega-3 Fatty Acids (FISH OIL) 1000 MG CAPS Take by mouth daily.    . Pyridoxine HCl (VITAMIN B-6) 500 MG tablet Take 500 mg by mouth daily.     No current facility-administered medications on file prior to visit.     PAST MEDICAL HISTORY: Past Medical History:  Diagnosis Date  . Back pain   . BRCA negative   . Cough, persistent   . Elevated cholesterol   . GERD (gastroesophageal reflux disease)   . Hyperlipidemia   . Hypertension    10/09/13:  pt denies  . Migraine    is the reason she takes BP med  . Neck pain     PAST SURGICAL HISTORY: Past Surgical History:  Procedure Laterality Date  . ABDOMINAL HYSTERECTOMY    . APPENDECTOMY    . TONSILLECTOMY AND ADENOIDECTOMY  1966    SOCIAL HISTORY: Social History   Tobacco Use  . Smoking status: Never Smoker  . Smokeless tobacco: Never Used  Substance Use Topics  . Alcohol use: Yes    Comment: RARE,SOCIAL  . Drug use: No    FAMILY HISTORY: Family History  Problem Relation Age of Onset  . Diabetes Mother   . Hyperlipidemia Mother   . Stroke Mother   . Depression Mother   . Obesity Mother   . Breast cancer Mother 8  . Cancer Father  PROSTATE  . Hypertension Father   . Mental illness Sister        had urosepsis--  and had schizo breakdown  . Breast cancer Maternal Aunt        in 70's  . Breast cancer Maternal Grandmother        in 50's  . Breast cancer Maternal Aunt        in 70's  . Breast cancer Maternal Aunt        in 70's  . Breast cancer Maternal Aunt        in 70's    ROS: Review of Systems  Constitutional: Positive for weight loss.  Cardiovascular: Negative for chest pain and claudication.  Gastrointestinal: Negative for nausea and vomiting.  Musculoskeletal: Negative for myalgias.       Negative muscle weakness    PHYSICAL EXAM: Blood pressure 132/80, pulse (!) 56, height 5' 2" (1.575 m), weight 142 lb (64.4 kg), SpO2 97 %. Body mass index is 25.97 kg/m. Physical Exam    Constitutional: She is oriented to person, place, and time. She appears well-developed and well-nourished.  Cardiovascular: Normal rate.  Pulmonary/Chest: Effort normal.  Musculoskeletal: Normal range of motion.  Neurological: She is oriented to person, place, and time.  Skin: Skin is warm and dry.  Psychiatric: She has a normal mood and affect. Her behavior is normal.  Vitals reviewed.   RECENT LABS AND TESTS: BMET    Component Value Date/Time   NA 142 07/04/2018 1022   K 4.7 07/04/2018 1022   CL 103 07/04/2018 1022   CO2 22 07/04/2018 1022   GLUCOSE 94 07/04/2018 1022   GLUCOSE 110 (H) 02/05/2018 1050   BUN 19 07/04/2018 1022   CREATININE 0.88 07/04/2018 1022   CALCIUM 10.3 07/04/2018 1022   GFRNONAA 72 07/04/2018 1022   GFRAA 83 07/04/2018 1022   Lab Results  Component Value Date   HGBA1C 5.5 07/04/2018   HGBA1C 5.8 (H) 02/28/2018   HGBA1C 5.7 (H) 11/20/2017   Lab Results  Component Value Date   INSULIN 8.5 07/04/2018   INSULIN 8.3 02/28/2018   INSULIN 11.2 11/20/2017   CBC    Component Value Date/Time   WBC 4.8 07/04/2018 1022   WBC 5.4 10/04/2013 1123   RBC 4.85 07/04/2018 1022   RBC 4.40 10/04/2013 1123   HGB 14.5 07/04/2018 1022   HCT 42.7 07/04/2018 1022   PLT 349.0 10/04/2013 1123   MCV 88 07/04/2018 1022   MCH 29.9 07/04/2018 1022   MCHC 34.0 07/04/2018 1022   MCHC 34.6 10/04/2013 1123   RDW 14.4 07/04/2018 1022   LYMPHSABS 1.3 07/04/2018 1022   MONOABS 0.3 10/04/2013 1123   EOSABS 0.3 07/04/2018 1022   BASOSABS 0.1 07/04/2018 1022   Iron/TIBC/Ferritin/ %Sat No results found for: IRON, TIBC, FERRITIN, IRONPCTSAT Lipid Panel     Component Value Date/Time   CHOL 213 (H) 07/04/2018 1022   TRIG 75 07/04/2018 1022   HDL 72 07/04/2018 1022   CHOLHDL 2 02/05/2018 1050   VLDL 9.6 02/05/2018 1050   LDLCALC 126 (H) 07/04/2018 1022   LDLDIRECT 151.8 07/22/2010 0904   Hepatic Function Panel     Component Value Date/Time   PROT 6.7 07/04/2018  1022   ALBUMIN 4.6 07/04/2018 1022   AST 25 07/04/2018 1022   ALT 32 07/04/2018 1022   ALKPHOS 83 07/04/2018 1022   BILITOT 0.4 07/04/2018 1022   BILIDIR 0.1 02/12/2015 1511   IBILI 0.4 09/29/2010 2010        Component Value Date/Time   TSH 2.000 07/04/2018 1022   TSH 1.880 11/20/2017 1158   TSH 1.30 10/04/2013 1123  Results for YOLANDE, SKODA (MRN [id number]) as of 08/27/2018 16:18  Ref. Range 07/04/2018 10:22  Vitamin D, 25-Hydroxy Latest Ref Range: 30.0 - 100.0 ng/mL 65.8    ASSESSMENT AND PLAN: Vitamin D deficiency - Plan: Vitamin D, Ergocalciferol, (DRISDOL) 50000 units CAPS capsule  Other hyperlipidemia  At risk for osteoporosis  Class 1 obesity with serious comorbidity and body mass index (BMI) of 30.0 to 30.9 in adult, unspecified obesity type - Starting BMI greater then 30  PLAN:  Vitamin D Deficiency Kristen Underwood was informed that low vitamin D levels contributes to fatigue and are associated with obesity, breast, and colon cancer. Kristen Underwood agrees to continue taking prescription Vit D _0 ,000 IU every week #4 and we will refill for 1 month. She will follow up for routine testing of vitamin D, at least 2-3 times per year. She was informed of the risk of over-replacement of vitamin D and agrees to not increase her dose unless she discusses this with [name] first. Kristen Underwood agrees to follow up with our clinic in 3 weeks.  At risk for osteopenia and osteoporosis Kristen Underwood was given extended (15 minutes) osteoporosis prevention counseling today. Kristen Underwood is at risk for osteopenia and osteoporsis due to her vitamin D deficiency. She was encouraged to take her vitamin D and follow her higher calcium diet and increase strengthening exercise to help strengthen her bones and decrease her risk of osteopenia and osteoporosis.  Hyperlipidemia Kristen Underwood was informed of the American Heart Association Guidelines emphasizing intensive lifestyle modifications as the first line treatment for hyperlipidemia. We discussed many  lifestyle modifications today in depth, and Kristen Underwood will continue to work on decreasing saturated fats such as fatty red meat, butter and many fried foods. She will also increase vegetables and lean protein in her diet and continue to work on exercise and weight loss efforts. Kristen Underwood agrees to continue taking atorvastatin per her primary care physician and she agrees to continue taking CoQ10. Kristen Underwood agrees to follow up with our clinic in 3 weeks.  Obesity Kristen Underwood is currently in the action stage of change. As such, her goal is to continue with weight loss efforts She has agreed to follow the Category 1 plan Kristen Underwood has been instructed to work up to a goal of 150 minutes of combined cardio and strengthening exercise per week or continue as is for weight loss and overall health benefits. We discussed the following Behavioral Modification Strategies today: increasing lean protein intake, holiday eating strategies, and planning for success  We discussed countinuing meal plan and exercise to decrease body fat versus weight loss as BMI is 26. Reinforced the importance of adequate protein.  Kristen Underwood has agreed to follow up with our clinic in 3 weeks. She was informed of the importance of frequent follow up visits to maximize her success with intensive lifestyle modifications for her multiple health conditions.   OBESITY BEHAVIORAL INTERVENTION VISIT  Today's visit was # 15   Starting weight: 181 lbs Starting date: 11/20/17 Today's weight : 142 lbs  Today's date: 08/22/2018 Total lbs lost to date: 43    ASK: We discussed the diagnosis of obesity with Kristen Underwood today and Kristen Underwood agreed to give [name] permission to discuss obesity behavioral modification therapy today.  ASSESS: Kristen Underwood has the diagnosis of obesity and her BMI today is 25.97 Kristen Underwood is in the action stage of change   ADVISE: Kristen Underwood was educated  on the multiple health risks of obesity as well as the benefit of weight loss to improve her health. She was  advised of the need for long term treatment and the importance of lifestyle modifications to improve her current health and to decrease her risk of future health problems.  AGREE: Multiple dietary modification options and treatment options were discussed and  Kristen Underwood agreed to follow the recommendations documented in the above note.  ARRANGE: Kristen Underwood was educated on the importance of frequent visits to treat obesity as outlined per CMS and USPSTF guidelines and agreed to schedule her next follow up appointment today.  I, Trixie Dredge, am acting as transcriptionist for Dennard Nip, MD  I have reviewed the above documentation for accuracy and completeness, and I agree with the above. -Dennard Nip, MD

## 2018-09-12 ENCOUNTER — Ambulatory Visit (INDEPENDENT_AMBULATORY_CARE_PROVIDER_SITE_OTHER): Payer: 59 | Admitting: Family Medicine

## 2018-09-12 VITALS — BP 127/77 | HR 69 | Temp 97.6°F | Ht 62.0 in | Wt 140.0 lb

## 2018-09-12 DIAGNOSIS — Z6830 Body mass index (BMI) 30.0-30.9, adult: Secondary | ICD-10-CM

## 2018-09-12 DIAGNOSIS — E559 Vitamin D deficiency, unspecified: Secondary | ICD-10-CM | POA: Diagnosis not present

## 2018-09-12 DIAGNOSIS — Z9189 Other specified personal risk factors, not elsewhere classified: Secondary | ICD-10-CM | POA: Diagnosis not present

## 2018-09-12 DIAGNOSIS — E669 Obesity, unspecified: Secondary | ICD-10-CM

## 2018-09-12 MED ORDER — VITAMIN D (ERGOCALCIFEROL) 1.25 MG (50000 UNIT) PO CAPS: 50000.0000 [IU] | ORAL_CAPSULE | ORAL | 0 refills | Status: DC

## 2018-09-13 NOTE — Progress Notes (Signed)
Office: [phone]  /  Fax: [phone]   HPI:   Chief Complaint: OBESITY Kristen Underwood is here to discuss her progress with her obesity treatment plan. She is on the Category 1 plan and is following her eating plan approximately 80 % of the time. She states she is walking and at the gym lifting weights for 20-30 minutes 4-6 times per week. Kristen Underwood continues to do well with weight loss. She states her hunger is controlled, but she has had some increased temptations and celebration eating.  Her weight is 140 lb (63.5 kg) today and has had a weight loss of 2 pounds over a period of 3 weeks since her last visit. She has lost 41 lbs since starting treatment with [name].  Vitamin D Deficiency Kristen Underwood has a diagnosis of vitamin D deficiency. She is stable on prescription Vit D and denies nausea, vomiting or muscle weakness.  At risk for osteopenia and osteoporosis Kristen Underwood is at higher risk of osteopenia and osteoporosis due to vitamin D deficiency.   ALLERGIES: No Known Allergies  MEDICATIONS: Current Outpatient Medications on File Prior to Visit  Medication Sig Dispense Refill  . aspirin 81 MG tablet Take 81 mg by mouth daily.    Marland Kitchen atorvastatin (LIPITOR) 20 MG tablet Take 1 tablet (20 mg total) by mouth daily. 90 tablet 1  . cholecalciferol (VITAMIN D) 1000 UNITS tablet Take 1,000 Units by mouth daily.      . Coenzyme Q10 (COQ10) 400 MG CAPS Take 1 tablet by mouth daily. 30 capsule 0  . fluticasone (FLONASE) 50 MCG/ACT nasal spray Place 2 sprays into both nostrils daily. 16 g 5  . Omega-3 Fatty Acids (FISH OIL) 1000 MG CAPS Take by mouth daily.    . Pyridoxine HCl (VITAMIN B-6) 500 MG tablet Take 500 mg by mouth daily.     No current facility-administered medications on file prior to visit.     PAST MEDICAL HISTORY: Past Medical History:  Diagnosis Date  . Back pain   . BRCA negative   . Cough, persistent   . Elevated cholesterol   . GERD (gastroesophageal reflux disease)   . Hyperlipidemia   .  Hypertension    10/09/13:  pt denies  . Migraine    is the reason she takes BP med  . Neck pain     PAST SURGICAL HISTORY: Past Surgical History:  Procedure Laterality Date  . ABDOMINAL HYSTERECTOMY    . APPENDECTOMY    . TONSILLECTOMY AND ADENOIDECTOMY  1966    SOCIAL HISTORY: Social History   Tobacco Use  . Smoking status: Never Smoker  . Smokeless tobacco: Never Used  Substance Use Topics  . Alcohol use: Yes    Comment: RARE,SOCIAL  . Drug use: No    FAMILY HISTORY: Family History  Problem Relation Age of Onset  . Diabetes Mother   . Hyperlipidemia Mother   . Stroke Mother   . Depression Mother   . Obesity Mother   . Breast cancer Mother 25  . Cancer Father        PROSTATE  . Hypertension Father   . Mental illness Sister        had urosepsis--  and had schizo breakdown  . Breast cancer Maternal Aunt        in 37's  . Breast cancer Maternal Grandmother        in 63's  . Breast cancer Maternal Aunt        in 2's  . Breast cancer  Maternal Aunt        in 25's  . Breast cancer Maternal Aunt        in 70's    ROS: Review of Systems  Constitutional: Positive for weight loss.  Gastrointestinal: Negative for nausea and vomiting.  Musculoskeletal:       Negative muscle weakness    PHYSICAL EXAM: Blood pressure 127/77, pulse 69, temperature 97.6 F (36.4 C), temperature source Oral, height _0  (1.575 m), weight 140 lb (63.5 kg), SpO2 99 %. Body mass index is 25.61 kg/m. Physical Exam  Constitutional: She is oriented to person, place, and time. She appears well-developed and well-nourished.  Cardiovascular: Normal rate.  Pulmonary/Chest: Effort normal.  Musculoskeletal: Normal range of motion.  Neurological: She is oriented to person, place, and time.  Skin: Skin is warm and dry.  Psychiatric: She has a normal mood and affect. Her behavior is normal.  Vitals reviewed.   RECENT LABS AND TESTS: BMET    Component Value Date/Time   NA 142  07/04/2018 1022   K 4.7 07/04/2018 1022   CL 103 07/04/2018 1022   CO2 22 07/04/2018 1022   GLUCOSE 94 07/04/2018 1022   GLUCOSE 110 (H) 02/05/2018 1050   BUN 19 07/04/2018 1022   CREATININE 0.88 07/04/2018 1022   CALCIUM 10.3 07/04/2018 1022   GFRNONAA 72 07/04/2018 1022   GFRAA 83 07/04/2018 1022   Lab Results  Component Value Date   HGBA1C 5.5 07/04/2018   HGBA1C 5.8 (H) 02/28/2018   HGBA1C 5.7 (H) 11/20/2017   Lab Results  Component Value Date   INSULIN 8.5 07/04/2018   INSULIN 8.3 02/28/2018   INSULIN 11.2 11/20/2017   CBC    Component Value Date/Time   WBC 4.8 07/04/2018 1022   WBC 5.4 10/04/2013 1123   RBC 4.85 07/04/2018 1022   RBC 4.40 10/04/2013 1123   HGB 14.5 07/04/2018 1022   HCT 42.7 07/04/2018 1022   PLT 349.0 10/04/2013 1123   MCV 88 07/04/2018 1022   MCH 29.9 07/04/2018 1022   MCHC 34.0 07/04/2018 1022   MCHC 34.6 10/04/2013 1123   RDW 14.4 07/04/2018 1022   LYMPHSABS 1.3 07/04/2018 1022   MONOABS 0.3 10/04/2013 1123   EOSABS 0.3 07/04/2018 1022   BASOSABS 0.1 07/04/2018 1022   Iron/TIBC/Ferritin/ %Sat No results found for: IRON, TIBC, FERRITIN, IRONPCTSAT Lipid Panel     Component Value Date/Time   CHOL 213 (H) 07/04/2018 1022   TRIG 75 07/04/2018 1022   HDL 72 07/04/2018 1022   CHOLHDL 2 02/05/2018 1050   VLDL 9.6 02/05/2018 1050   LDLCALC 126 (H) 07/04/2018 1022   LDLDIRECT 151.8 07/22/2010 0904   Hepatic Function Panel     Component Value Date/Time   PROT 6.7 07/04/2018 1022   ALBUMIN 4.6 07/04/2018 1022   AST 25 07/04/2018 1022   ALT 32 07/04/2018 1022   ALKPHOS 83 07/04/2018 1022   BILITOT 0.4 07/04/2018 1022   BILIDIR 0.1 02/12/2015 1511   IBILI 0.4 09/29/2010 2010      Component Value Date/Time   TSH 2.000 07/04/2018 1022   TSH 1.880 11/20/2017 1158   TSH 1.30 10/04/2013 1123  Results for SHAVONNE, AMBROISE (MRN [id number]) as of 09/13/2018 10:38  Ref. Range 07/04/2018 10:22  Vitamin D, 25-Hydroxy Latest Ref Range: 30.0 -  100.0 ng/mL 65.8    ASSESSMENT AND PLAN: Vitamin D deficiency - Plan: Vitamin D, Ergocalciferol, (DRISDOL) 1.25 MG (50000 UT) CAPS capsule  At risk for osteoporosis  Class  1 obesity with serious comorbidity and body mass index (BMI) of 30.0 to 30.9 in adult, unspecified obesity type - Starting BMI greater then 30  PLAN:  Vitamin D Deficiency Kristen Underwood was informed that low vitamin D levels contributes to fatigue and are associated with obesity, breast, and colon cancer. Kristen Underwood agrees to continue taking prescription Vit D _0 ,000 IU every week #4 and we will refill for 1 month. She will follow up for routine testing of vitamin D, at least 2-3 times per year. She was informed of the risk of over-replacement of vitamin D and agrees to not increase her dose unless she discusses this with [name] first. Kristen Underwood agrees to follow up with our clinic in 3 to 4 weeks.  At risk for osteopenia and osteoporosis Kristen Underwood was given extended (15 minutes) osteoporosis prevention counseling today. Kristen Underwood is at risk for osteopenia and osteoporsis due to her vitamin D deficiency. She was encouraged to take her vitamin D and follow her higher calcium diet and increase strengthening exercise to help strengthen her bones and decrease her risk of osteopenia and osteoporosis.  Obesity Kristen Underwood is currently in the action stage of change. As such, her goal is to continue with weight loss efforts She has agreed to follow the Category 1 plan Kristen Underwood has been instructed to work up to a goal of 150 minutes of combined cardio and strengthening exercise per week for weight loss and overall health benefits. We discussed the following Behavioral Modification Strategies today: increasing lean protein intake, decreasing simple carbohydrates, and celebration eating strategies    Kristen Underwood has agreed to follow up with our clinic in 3 to 4 weeks. She was informed of the importance of frequent follow up visits to maximize her success with intensive lifestyle  modifications for her multiple health conditions.   OBESITY BEHAVIORAL INTERVENTION VISIT  Today's visit was # 16   Starting weight: 181 lbs Starting date: 11/20/17 Today's weight : 140 lbs Today's date: 09/12/2018 Total lbs lost to date: 60    ASK: We discussed the diagnosis of obesity with Kristen Underwood today and Kristen Underwood agreed to give [name] permission to discuss obesity behavioral modification therapy today.  ASSESS: Kristen Underwood has the diagnosis of obesity and her BMI today is 25.6 Kristen Underwood is in the action stage of change   ADVISE: Kristen Underwood was educated on the multiple health risks of obesity as well as the benefit of weight loss to improve her health. She was advised of the need for long term treatment and the importance of lifestyle modifications to improve her current health and to decrease her risk of future health problems.  AGREE: Multiple dietary modification options and treatment options were discussed and  Kristen Underwood agreed to follow the recommendations documented in the above note.  ARRANGE: Kristen Underwood was educated on the importance of frequent visits to treat obesity as outlined per CMS and USPSTF guidelines and agreed to schedule her next follow up appointment today.  I, Trixie Dredge, am acting as transcriptionist for Dennard Nip, MD  I have reviewed the above documentation for accuracy and completeness, and I agree with the above. -Dennard Nip, MD

## 2018-09-22 ENCOUNTER — Other Ambulatory Visit (INDEPENDENT_AMBULATORY_CARE_PROVIDER_SITE_OTHER): Payer: Self-pay | Admitting: Family Medicine

## 2018-09-22 DIAGNOSIS — E559 Vitamin D deficiency, unspecified: Secondary | ICD-10-CM

## 2018-10-09 ENCOUNTER — Ambulatory Visit (INDEPENDENT_AMBULATORY_CARE_PROVIDER_SITE_OTHER): Payer: 59 | Admitting: Family Medicine

## 2018-10-09 VITALS — BP 99/66 | HR 64 | Ht 62.0 in | Wt 136.0 lb

## 2018-10-09 DIAGNOSIS — E8881 Metabolic syndrome: Secondary | ICD-10-CM | POA: Diagnosis not present

## 2018-10-09 DIAGNOSIS — Z9189 Other specified personal risk factors, not elsewhere classified: Secondary | ICD-10-CM

## 2018-10-09 DIAGNOSIS — E538 Deficiency of other specified B group vitamins: Secondary | ICD-10-CM

## 2018-10-09 DIAGNOSIS — E669 Obesity, unspecified: Secondary | ICD-10-CM

## 2018-10-09 DIAGNOSIS — E7849 Other hyperlipidemia: Secondary | ICD-10-CM | POA: Diagnosis not present

## 2018-10-09 DIAGNOSIS — E559 Vitamin D deficiency, unspecified: Secondary | ICD-10-CM | POA: Diagnosis not present

## 2018-10-09 DIAGNOSIS — Z6830 Body mass index (BMI) 30.0-30.9, adult: Secondary | ICD-10-CM

## 2018-10-09 MED ORDER — VITAMIN D (ERGOCALCIFEROL) 1.25 MG (50000 UNIT) PO CAPS: 50000.0000 [IU] | ORAL_CAPSULE | ORAL | 0 refills | Status: DC

## 2018-10-10 LAB — COMPREHENSIVE METABOLIC PANEL
ALK PHOS: 92 IU/L (ref 39–117)
ALT: 48 IU/L — ABNORMAL HIGH (ref 0–32)
AST: 30 IU/L (ref 0–40)
Albumin/Globulin Ratio: 2.2 (ref 1.2–2.2)
Albumin: 4.6 g/dL (ref 3.6–4.8)
BILIRUBIN TOTAL: 0.4 mg/dL (ref 0.0–1.2)
BUN/Creatinine Ratio: 23 (ref 12–28)
BUN: 23 mg/dL (ref 8–27)
CHLORIDE: 101 mmol/L (ref 96–106)
CO2: 24 mmol/L (ref 20–29)
CREATININE: 1.02 mg/dL — AB (ref 0.57–1.00)
Calcium: 10.3 mg/dL (ref 8.7–10.3)
GFR calc Af Amer: 69 mL/min/{1.73_m2} (ref >59)
GFR calc non Af Amer: 60 mL/min/{1.73_m2} (ref >59)
GLOBULIN, TOTAL: 2.1 g/dL (ref 1.5–4.5)
Glucose: 89 mg/dL (ref 65–99)
POTASSIUM: 4.6 mmol/L (ref 3.5–5.2)
SODIUM: 140 mmol/L (ref 134–144)
Total Protein: 6.7 g/dL (ref 6.0–8.5)

## 2018-10-10 LAB — LIPID PANEL WITH LDL/HDL RATIO
Cholesterol, Total: 219 mg/dL — ABNORMAL HIGH (ref 100–199)
HDL: 80 mg/dL (ref >39)
LDL Calculated: 123 mg/dL — ABNORMAL HIGH (ref 0–99)
LDl/HDL Ratio: 1.5 ratio (ref 0.0–3.2)
Triglycerides: 78 mg/dL (ref 0–149)
VLDL CHOLESTEROL CAL: 16 mg/dL (ref 5–40)

## 2018-10-10 LAB — HEMOGLOBIN A1C
ESTIMATED AVERAGE GLUCOSE: 111 mg/dL
Hgb A1c MFr Bld: 5.5 % (ref 4.8–5.6)

## 2018-10-10 LAB — VITAMIN D 25 HYDROXY (VIT D DEFICIENCY, FRACTURES): VIT D 25 HYDROXY: 66.8 ng/mL (ref 30.0–100.0)

## 2018-10-10 LAB — VITAMIN B12: Vitamin B-12: >2000 pg/mL — ABNORMAL HIGH (ref 232–1245)

## 2018-10-10 LAB — INSULIN, RANDOM: INSULIN: 5.4 u[IU]/mL (ref 2.6–24.9)

## 2018-10-11 NOTE — Progress Notes (Signed)
Office: [phone]  /  Fax: [phone]   HPI:   Chief Complaint: OBESITY Kristen Underwood is here to discuss her progress with her obesity treatment plan. She is on the Category 1 plan and is following her eating plan approximately 70 % of the time. She states she is exercising 0 minutes 0 times per week. Kristen Underwood continues to do well on her Category 1 plan and was pleasantly surprised as she continued to lose.  Her weight is 136 lb (61.7 kg) today and has had a weight loss of 4 pounds over a period of 4 weeks since her last visit. She has lost 45 lbs since starting treatment with [name].  Vitamin D deficiency Kristen Underwood has a diagnosis of vitamin D deficiency. She is currently taking vit D and is due for labs today. She denies nausea, vomiting, or muscle weakness.  Hyperlipidemia Kristen Underwood has hyperlipidemia and has been trying to improve her cholesterol levels with intensive lifestyle modification including a low saturated fat diet, exercise and weight loss. She is taking Lipitor 88m and fish oil. She is due for labs today. She denies any chest pain or myalgias.  At risk for cardiovascular disease Kristen Underwood at a higher than average risk for cardiovascular disease due to hyperlipidemia and obesity. She currently denies any chest pain.  Vitamin B12 Deficiency Kristen Underwood a diagnosis of B12 insufficiency and notes that her fatigue has improved. This is not a new diagnosis and she does not have a previous diagnosis of pernicious anemia. She does not have a history of weight loss surgery. Her B12 was over replaced on OTC B12 so she agrees to reduce her dose to every other day.  Insulin Resistance GArlisshas a diagnosis of insulin resistance based on her elevated fasting insulin level >5. Although Kristen Underwood's blood glucose readings are still under good control, insulin resistance puts her at greater risk of metabolic syndrome and diabetes. She is not taking metformin currently and is well controlled with diet. She continues to  work on diet and exercise to decrease risk of diabetes. She denies hypoglycemia. She is due for labs today.   ALLERGIES: No Known Allergies  MEDICATIONS: Current Outpatient Medications on File Prior to Visit  Medication Sig Dispense Refill  . aspirin 81 MG tablet Take 81 mg by mouth daily.    .Marland Kitchenatorvastatin (LIPITOR) 20 MG tablet Take 1 tablet (20 mg total) by mouth daily. 90 tablet 1  . cholecalciferol (VITAMIN D) 1000 UNITS tablet Take 1,000 Units by mouth daily.      . Coenzyme Q10 (COQ10) 400 MG CAPS Take 1 tablet by mouth daily. 30 capsule 0  . fluticasone (FLONASE) 50 MCG/ACT nasal spray Place 2 sprays into both nostrils daily. 16 g 5  . Omega-3 Fatty Acids (FISH OIL) 1000 MG CAPS Take by mouth daily.    . Pyridoxine HCl (VITAMIN B-6) 500 MG tablet Take 500 mg by mouth daily.     No current facility-administered medications on file prior to visit.     PAST MEDICAL HISTORY: Past Medical History:  Diagnosis Date  . Back pain   . BRCA negative   . Cough, persistent   . Elevated cholesterol   . GERD (gastroesophageal reflux disease)   . Hyperlipidemia   . Hypertension    10/09/13:  pt denies  . Migraine    is the reason she takes BP med  . Neck pain     PAST SURGICAL HISTORY: Past Surgical History:  Procedure Laterality Date  .  ABDOMINAL HYSTERECTOMY    . APPENDECTOMY    . TONSILLECTOMY AND ADENOIDECTOMY  1966    SOCIAL HISTORY: Social History   Tobacco Use  . Smoking status: Never Smoker  . Smokeless tobacco: Never Used  Substance Use Topics  . Alcohol use: Yes    Comment: RARE,SOCIAL  . Drug use: No    FAMILY HISTORY: Family History  Problem Relation Age of Onset  . Diabetes Mother   . Hyperlipidemia Mother   . Stroke Mother   . Depression Mother   . Obesity Mother   . Breast cancer Mother 54  . Cancer Father        PROSTATE  . Hypertension Father   . Mental illness Sister        had urosepsis--  and had schizo breakdown  . Breast cancer  Maternal Aunt        in 56's  . Breast cancer Maternal Grandmother        in 72's  . Breast cancer Maternal Aunt        in 82's  . Breast cancer Maternal Aunt        in 20's  . Breast cancer Maternal Aunt        in 70's    ROS: Review of Systems  Constitutional: Positive for malaise/fatigue and weight loss.  Cardiovascular: Negative for chest pain.  Gastrointestinal: Negative for nausea and vomiting.  Musculoskeletal: Negative for myalgias.       Negative for muscle weakness.  Endo/Heme/Allergies:       Negative for hypoglycemia.    PHYSICAL EXAM: Blood pressure 99/66, pulse 64, height 5' 2"  (1.575 m), weight 136 lb (61.7 kg), SpO2 96 %. Body mass index is 24.87 kg/m. Physical Exam  Constitutional: She is oriented to person, place, and time. She appears well-developed and well-nourished.  Cardiovascular: Normal rate.  Pulmonary/Chest: Effort normal.  Musculoskeletal: Normal range of motion.  Neurological: She is oriented to person, place, and time.  Skin: Skin is warm and dry.  Psychiatric: She has a normal mood and affect. Her behavior is normal.  Vitals reviewed.   RECENT LABS AND TESTS: BMET    Component Value Date/Time   NA 140 10/09/2018 0858   K 4.6 10/09/2018 0858   CL 101 10/09/2018 0858   CO2 24 10/09/2018 0858   GLUCOSE 89 10/09/2018 0858   GLUCOSE 110 (H) 02/05/2018 1050   BUN 23 10/09/2018 0858   CREATININE 1.02 (H) 10/09/2018 0858   CALCIUM 10.3 10/09/2018 0858   GFRNONAA 60 10/09/2018 0858   GFRAA 69 10/09/2018 0858   Lab Results  Component Value Date   HGBA1C 5.5 10/09/2018   HGBA1C 5.5 07/04/2018   HGBA1C 5.8 (H) 02/28/2018   HGBA1C 5.7 (H) 11/20/2017   Lab Results  Component Value Date   INSULIN 5.4 10/09/2018   INSULIN 8.5 07/04/2018   INSULIN 8.3 02/28/2018   INSULIN 11.2 11/20/2017   CBC    Component Value Date/Time   WBC 4.8 07/04/2018 1022   WBC 5.4 10/04/2013 1123   RBC 4.85 07/04/2018 1022   RBC 4.40 10/04/2013 1123    HGB 14.5 07/04/2018 1022   HCT 42.7 07/04/2018 1022   PLT 349.0 10/04/2013 1123   MCV 88 07/04/2018 1022   MCH 29.9 07/04/2018 1022   MCHC 34.0 07/04/2018 1022   MCHC 34.6 10/04/2013 1123   RDW 14.4 07/04/2018 1022   LYMPHSABS 1.3 07/04/2018 1022   MONOABS 0.3 10/04/2013 1123   EOSABS 0.3 07/04/2018 1022  BASOSABS 0.1 07/04/2018 1022   Iron/TIBC/Ferritin/ %Sat No results found for: IRON, TIBC, FERRITIN, IRONPCTSAT Lipid Panel     Component Value Date/Time   CHOL 219 (H) 10/09/2018 0858   TRIG 78 10/09/2018 0858   HDL 80 10/09/2018 0858   CHOLHDL 2 02/05/2018 1050   VLDL 9.6 02/05/2018 1050   LDLCALC 123 (H) 10/09/2018 0858   LDLDIRECT 151.8 07/22/2010 0904   Hepatic Function Panel     Component Value Date/Time   PROT 6.7 10/09/2018 0858   ALBUMIN 4.6 10/09/2018 0858   AST 30 10/09/2018 0858   ALT 48 (H) 10/09/2018 0858   ALKPHOS 92 10/09/2018 0858   BILITOT 0.4 10/09/2018 0858   BILIDIR 0.1 02/12/2015 1511   IBILI 0.4 09/29/2010 2010      Component Value Date/Time   TSH 2.000 07/04/2018 1022   TSH 1.880 11/20/2017 1158   TSH 1.30 10/04/2013 1123   Results for LEYLANI, DULEY (MRN [id number]) as of 10/11/2018 15:47  Ref. Range 10/09/2018 08:58  Vitamin D, 25-Hydroxy Latest Ref Range: 30.0 - 100.0 ng/mL 66.8   ASSESSMENT AND PLAN: Vitamin D deficiency - Plan: VITAMIN D 25 Hydroxy (Vit-D Deficiency, Fractures), Vitamin D, Ergocalciferol, (DRISDOL) 1.25 MG (50000 UT) CAPS capsule  Other hyperlipidemia - Plan: Lipid Panel With LDL/HDL Ratio  B12 nutritional deficiency - Plan: Vitamin B12  Insulin resistance - Plan: Comprehensive metabolic panel, Hemoglobin A1c, Insulin, random  At risk for heart disease  Class 1 obesity with serious comorbidity and body mass index (BMI) of 30.0 to 30.9 in adult, unspecified obesity type - Starting bMI greater then 30  PLAN:  Vitamin D Deficiency Kristen Underwood was informed that low vitamin D levels contributes to fatigue and are  associated with obesity, breast, and colon cancer. She agrees to continue to take prescription Vit D @50 ,000 IU every week #4 with no refills and will follow up for routine testing of vitamin D, at least 2-3 times per year. She was informed of the risk of over-replacement of vitamin D and agrees to not increase her dose unless she discusses this with [name] first. Kristen Underwood agrees to follow up in 2 weeks.  Hyperlipidemia Kristen Underwood was informed of the American Heart Association Guidelines emphasizing intensive lifestyle modifications as the first line treatment for hyperlipidemia. We discussed many lifestyle modifications today in depth, and Kristen Underwood will continue to work on decreasing saturated fats such as fatty red meat, butter and many fried foods. She will also increase vegetables and lean protein in her diet and continue to work on exercise and weight loss efforts. We will check labs today and Kristen Underwood agrees to continue her diet and her medications and will follow up as agreed.  Cardiovascular risk counseling Kristen Underwood was given extended (15 minutes) coronary artery disease prevention counseling today. She is 62 y.o. female and has risk factors for heart disease including hyperlipidemia and obesity. We discussed intensive lifestyle modifications today with an emphasis on specific weight loss instructions and strategies. Pt was also informed of the importance of increasing exercise and decreasing saturated fats to help prevent heart disease.  Vitamin B12 Deficiency Kristen Underwood B12 was over replaced with OTC B12. She agrees to reduce her dose of B12 to every other day. We will plan on rechecking labs today and she agreed to follow up as directed in 4 weeks.  Insulin Resistance Kristen Underwood will continue to work on weight loss, exercise, and decreasing simple carbohydrates in her diet to help decrease the risk of diabetes. She was informed that eating  too many simple carbohydrates or too many calories at one sitting increases the likelihood  of GI side effects. She agrees to continue with diet and exrecise. We will check labs today and Kristen Underwood agreed to follow up with [name] as directed to monitor her progress.  Obesity Kristen Underwood is currently in the action stage of change. As such, her goal is to continue with weight loss efforts. She has agreed to follow the Category 1 plan. Kristen Underwood has been instructed to work up to a goal of 150 minutes of combined cardio and strengthening exercise per week for weight loss and overall health benefits. We discussed the following Behavioral Modification Strategies today: increasing lean protein intake, decreasing simple carbohydrates,  and holiday eating strategies.   Kristen Underwood has agreed to follow up with our clinic in 4 weeks. She was informed of the importance of frequent follow up visits to maximize her success with intensive lifestyle modifications for her multiple health conditions.   OBESITY BEHAVIORAL INTERVENTION VISIT  Today's visit was # 17   Starting weight: 181 lbs Starting date: 11/20/17 Today's weight : Weight: 136 lb (61.7 kg)  Today's date: 10/09/2018 Total lbs lost to date: 23  ASK: We discussed the diagnosis of obesity with Kristen Underwood today and Kristen Underwood agreed to give [name] permission to discuss obesity behavioral modification therapy today.  ASSESS: Kristen Underwood has the diagnosis of obesity and her BMI today is 24.87. Kristen Underwood is in the action stage of change.   ADVISE: Kristen Underwood was educated on the multiple health risks of obesity as well as the benefit of weight loss to improve her health. She was advised of the need for long term treatment and the importance of lifestyle modifications to improve her current health and to decrease her risk of future health problems.  AGREE: Multiple dietary modification options and treatment options were discussed and Shinita agreed to follow the recommendations documented in the above note.  ARRANGE: Briceida was educated on the importance of frequent visits to treat obesity as  outlined per CMS and USPSTF guidelines and agreed to schedule her next follow up appointment today.  I, Marcille Blanco, am acting as transcriptionist for Starlyn Skeans, MD  I have reviewed the above documentation for accuracy and completeness, and I agree with the above. -Dennard Nip, MD

## 2018-10-16 ENCOUNTER — Encounter (INDEPENDENT_AMBULATORY_CARE_PROVIDER_SITE_OTHER): Payer: Self-pay | Admitting: Family Medicine

## 2018-11-06 ENCOUNTER — Encounter (INDEPENDENT_AMBULATORY_CARE_PROVIDER_SITE_OTHER): Payer: Self-pay | Admitting: Family Medicine

## 2018-11-12 ENCOUNTER — Ambulatory Visit (INDEPENDENT_AMBULATORY_CARE_PROVIDER_SITE_OTHER): Payer: 59 | Admitting: Family Medicine

## 2018-11-12 ENCOUNTER — Encounter (INDEPENDENT_AMBULATORY_CARE_PROVIDER_SITE_OTHER): Payer: Self-pay

## 2018-11-20 ENCOUNTER — Ambulatory Visit (INDEPENDENT_AMBULATORY_CARE_PROVIDER_SITE_OTHER): Payer: 59 | Admitting: Family Medicine

## 2018-11-22 ENCOUNTER — Ambulatory Visit (INDEPENDENT_AMBULATORY_CARE_PROVIDER_SITE_OTHER): Payer: 59 | Admitting: Family Medicine

## 2018-11-26 ENCOUNTER — Ambulatory Visit (INDEPENDENT_AMBULATORY_CARE_PROVIDER_SITE_OTHER): Payer: 59 | Admitting: Family Medicine

## 2018-11-26 ENCOUNTER — Encounter (INDEPENDENT_AMBULATORY_CARE_PROVIDER_SITE_OTHER): Payer: Self-pay | Admitting: Family Medicine

## 2018-11-26 VITALS — BP 109/67 | HR 56 | Temp 97.8°F | Ht 62.0 in | Wt 135.0 lb

## 2018-11-26 DIAGNOSIS — E559 Vitamin D deficiency, unspecified: Secondary | ICD-10-CM

## 2018-11-26 DIAGNOSIS — Z6830 Body mass index (BMI) 30.0-30.9, adult: Secondary | ICD-10-CM | POA: Diagnosis not present

## 2018-11-26 DIAGNOSIS — Z9189 Other specified personal risk factors, not elsewhere classified: Secondary | ICD-10-CM

## 2018-11-26 DIAGNOSIS — E669 Obesity, unspecified: Secondary | ICD-10-CM

## 2018-11-26 NOTE — Progress Notes (Signed)
Office: [phone]  /  Fax: [phone]   HPI:   Chief Complaint: OBESITY Kristen Underwood is here to discuss her progress with her obesity treatment plan. She is on the Category 1 plan and is following her eating plan approximately 70 % of the time. She states she is walking and going to the gym 40 minutes 2-3 times per week. Carrell is surprised she lost weight. She recently got back from a cruise 5 days ago and also had several holiday family gatherings.  She is back on plan. She is at goal weight with BMI of 24.69. Visceral fat rating is 8 and she is at 36.1% body fat. Her weight is 135 lb (61.2 kg) today and has had a weight loss of 1 pounds over a period of 7 weeks since her last visit. She has lost 45 lbs since starting treatment with [name].  Vitamin D deficiency Kristen Underwood has a diagnosis of vitamin D deficiency. She is currently taking prescription Vit D and denies nausea, vomiting or muscle weakness. Her last Vit D level was at goal 66.8 on 10/09/2018.  At risk for osteopenia and osteoporosis Kristen Underwood is at higher risk of osteopenia and osteoporosis due to vitamin D deficiency.   ASSESSMENT AND PLAN:  Vitamin D deficiency  At risk for osteoporosis  Class 1 obesity with serious comorbidity and body mass index (BMI) of 30.0 to 30.9 in adult, unspecified obesity type - Starting BMI greater then 30  PLAN:  Vitamin D Deficiency Kristen Underwood was informed that low vitamin D levels contributes to fatigue and are associated with obesity, breast, and colon cancer. She agrees to discontinue taking prescription Vit D @50 ,000 IU every week and start taking OTC vitamin D3 2000 IU daily and will follow up for routine testing of vitamin D, at least 2-3 times per year. She was informed of the risk of over-replacement of vitamin D and agrees to not increase her dose unless she discusses this with [name] first. Kristen Underwood agrees to follow up with our clinic in 3 weeks.  At risk for osteopenia and osteoporosis Kristen Underwood was given extended   (15 minutes) osteoporosis prevention counseling today. Kristen Underwood is at risk for osteopenia and osteoporosis due to her vitamin D deficiency. She was encouraged to take her vitamin D and follow her higher calcium diet and increase strengthening exercise to help strengthen her bones and decrease her risk of osteopenia and osteoporosis.  Obesity Kristen Underwood is currently in the action stage of change. As such, her goal is to continue with weight loss efforts She has agreed to follow the Category 1 plan   Lab results were discussed in depth. 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 I offered a visit with the RD to discuss weight maintenance but she declined.  We discussed the following Behavioral Modification Strategies today:planning for success  Kristen Underwood has agreed to follow up with our clinic in 3 weeks. She was informed of the importance of frequent follow up visits to maximize her success with intensive lifestyle modifications for her multiple health conditions.  ALLERGIES: No Known Allergies  MEDICATIONS: Current Outpatient Medications on File Prior to Visit  Medication Sig Dispense Refill  . aspirin 81 MG tablet Take 81 mg by mouth daily.    Marland Kitchen atorvastatin (LIPITOR) 20 MG tablet Take 1 tablet (20 mg total) by mouth daily. 90 tablet 1  . cholecalciferol (VITAMIN D) 1000 UNITS tablet Take 1,000 Units by mouth daily.      . Coenzyme Q10 (  COQ10) 400 MG CAPS Take 1 tablet by mouth daily. 30 capsule 0  . fluticasone (FLONASE) 50 MCG/ACT nasal spray Place 2 sprays into both nostrils daily. 16 g 5  . Omega-3 Fatty Acids (FISH OIL) 1000 MG CAPS Take by mouth daily.    . Pyridoxine HCl (VITAMIN B-6) 500 MG tablet Take 500 mg by mouth daily.     No current facility-administered medications on file prior to visit.     PAST MEDICAL HISTORY: Past Medical History:  Diagnosis Date  . Back pain   . BRCA negative   . Cough, persistent   . Elevated cholesterol   . GERD  (gastroesophageal reflux disease)   . Hyperlipidemia   . Hypertension    10/09/13:  pt denies  . Migraine    is the reason she takes BP med  . Neck pain     PAST SURGICAL HISTORY: Past Surgical History:  Procedure Laterality Date  . ABDOMINAL HYSTERECTOMY    . APPENDECTOMY    . TONSILLECTOMY AND ADENOIDECTOMY  1966    SOCIAL HISTORY: Social History   Tobacco Use  . Smoking status: Never Smoker  . Smokeless tobacco: Never Used  Substance Use Topics  . Alcohol use: Yes    Comment: RARE,SOCIAL  . Drug use: No    FAMILY HISTORY: Family History  Problem Relation Age of Onset  . Diabetes Mother   . Hyperlipidemia Mother   . Stroke Mother   . Depression Mother   . Obesity Mother   . Breast cancer Mother 104  . Cancer Father        PROSTATE  . Hypertension Father   . Mental illness Sister        had urosepsis--  and had schizo breakdown  . Breast cancer Maternal Aunt        in 15's  . Breast cancer Maternal Grandmother        in 82's  . Breast cancer Maternal Aunt        in 75's  . Breast cancer Maternal Aunt        in 17's  . Breast cancer Maternal Aunt        in 70's    ROS: Review of Systems  Constitutional: Positive for weight loss.  Gastrointestinal: Negative for nausea and vomiting.  Musculoskeletal:       Negative for muscle weakness    PHYSICAL EXAM: Blood pressure 109/67, pulse (!) 56, temperature 97.8 F (36.6 C), temperature source Oral, height 5' 2"  (1.575 m), weight 135 lb (61.2 kg), SpO2 98 %. Body mass index is 24.69 kg/m. Physical Exam Vitals signs reviewed.  Constitutional:      Appearance: Normal appearance. She is obese.  Cardiovascular:     Rate and Rhythm: Normal rate.     Pulses: Normal pulses.  Pulmonary:     Effort: Pulmonary effort is normal.  Musculoskeletal: Normal range of motion.  Skin:    General: Skin is warm and dry.  Neurological:     Mental Status: She is alert and oriented to person, place, and time.    Psychiatric:        Mood and Affect: Mood normal.        Behavior: Behavior normal.     RECENT LABS AND TESTS: BMET    Component Value Date/Time   NA 140 10/09/2018 0858   K 4.6 10/09/2018 0858   CL 101 10/09/2018 0858   CO2 24 10/09/2018 0858   GLUCOSE 89 10/09/2018 0858  GLUCOSE 110 (H) 02/05/2018 1050   BUN 23 10/09/2018 0858   CREATININE 1.02 (H) 10/09/2018 0858   CALCIUM 10.3 10/09/2018 0858   GFRNONAA 60 10/09/2018 0858   GFRAA 69 10/09/2018 0858   Lab Results  Component Value Date   HGBA1C 5.5 10/09/2018   HGBA1C 5.5 07/04/2018   HGBA1C 5.8 (H) 02/28/2018   HGBA1C 5.7 (H) 11/20/2017   Lab Results  Component Value Date   INSULIN 5.4 10/09/2018   INSULIN 8.5 07/04/2018   INSULIN 8.3 02/28/2018   INSULIN 11.2 11/20/2017   CBC    Component Value Date/Time   WBC 4.8 07/04/2018 1022   WBC 5.4 10/04/2013 1123   RBC 4.85 07/04/2018 1022   RBC 4.40 10/04/2013 1123   HGB 14.5 07/04/2018 1022   HCT 42.7 07/04/2018 1022   PLT 349.0 10/04/2013 1123   MCV 88 07/04/2018 1022   MCH 29.9 07/04/2018 1022   MCHC 34.0 07/04/2018 1022   MCHC 34.6 10/04/2013 1123   RDW 14.4 07/04/2018 1022   LYMPHSABS 1.3 07/04/2018 1022   MONOABS 0.3 10/04/2013 1123   EOSABS 0.3 07/04/2018 1022   BASOSABS 0.1 07/04/2018 1022   Iron/TIBC/Ferritin/ %Sat No results found for: IRON, TIBC, FERRITIN, IRONPCTSAT Lipid Panel     Component Value Date/Time   CHOL 219 (H) 10/09/2018 0858   TRIG 78 10/09/2018 0858   HDL 80 10/09/2018 0858   CHOLHDL 2 02/05/2018 1050   VLDL 9.6 02/05/2018 1050   LDLCALC 123 (H) 10/09/2018 0858   LDLDIRECT 151.8 07/22/2010 0904   Hepatic Function Panel     Component Value Date/Time   PROT 6.7 10/09/2018 0858   ALBUMIN 4.6 10/09/2018 0858   AST 30 10/09/2018 0858   ALT 48 (H) 10/09/2018 0858   ALKPHOS 92 10/09/2018 0858   BILITOT 0.4 10/09/2018 0858   BILIDIR 0.1 02/12/2015 1511   IBILI 0.4 09/29/2010 2010      Component Value Date/Time   TSH  2.000 07/04/2018 1022   TSH 1.880 11/20/2017 1158   TSH 1.30 10/04/2013 1123     Ref. Range 10/09/2018 08:58  Vitamin D, 25-Hydroxy Latest Ref Range: 30.0 - 100.0 ng/mL 66.8     OBESITY BEHAVIORAL INTERVENTION VISIT  Today's visit was # 18  Starting weight: 180 lbs Starting date: 11/20/2017 Today's weight :: 135 lbs Today's date: 11/26/2018 Total lbs lost to date: 42   ASK: We discussed the diagnosis of obesity with Kristen Underwood today and Kristen Underwood agreed to give [name] permission to discuss obesity behavioral modification therapy today.  ASSESS: Kristen Underwood has the diagnosis of obesity and her BMI today is 24.69 Kristen Underwood is in the action stage of change   ADVISE: Kristen Underwood was educated on the multiple health risks of obesity as well as the benefit of weight loss to improve her health. She was advised of the need for long term treatment and the importance of lifestyle modifications to improve her current health and to decrease her risk of future health problems.  AGREE: Multiple dietary modification options and treatment options were discussed and  Kristen Underwood agreed to follow the recommendations documented in the above note.  ARRANGE: Kristen Underwood was educated on the importance of frequent visits to treat obesity as outlined per CMS and USPSTF guidelines and agreed to schedule her next follow up appointment today.  I Kristen Underwood, am acting as Location manager for [employer], FNP-C.  I have reviewed the above documentation for accuracy and completeness, and I agree with the above.  - [employer],  FNP-C.

## 2018-12-18 ENCOUNTER — Encounter (INDEPENDENT_AMBULATORY_CARE_PROVIDER_SITE_OTHER): Payer: Self-pay | Admitting: Family Medicine

## 2018-12-18 ENCOUNTER — Ambulatory Visit (INDEPENDENT_AMBULATORY_CARE_PROVIDER_SITE_OTHER): Payer: 59 | Admitting: Family Medicine

## 2018-12-18 VITALS — BP 111/73 | HR 62 | Ht 62.0 in | Wt 131.0 lb

## 2018-12-18 DIAGNOSIS — R0602 Shortness of breath: Secondary | ICD-10-CM | POA: Diagnosis not present

## 2018-12-18 DIAGNOSIS — E669 Obesity, unspecified: Secondary | ICD-10-CM | POA: Diagnosis not present

## 2018-12-18 DIAGNOSIS — Z9189 Other specified personal risk factors, not elsewhere classified: Secondary | ICD-10-CM | POA: Diagnosis not present

## 2018-12-18 DIAGNOSIS — R740 Nonspecific elevation of levels of transaminase and lactic acid dehydrogenase [LDH]: Secondary | ICD-10-CM

## 2018-12-18 DIAGNOSIS — Z6830 Body mass index (BMI) 30.0-30.9, adult: Secondary | ICD-10-CM

## 2018-12-18 DIAGNOSIS — R7401 Elevation of levels of liver transaminase levels: Secondary | ICD-10-CM

## 2018-12-19 NOTE — Progress Notes (Signed)
Office: [phone]  /  Fax: [phone]   HPI:   Chief Complaint: OBESITY Kristen Underwood is here to discuss her progress with her obesity treatment plan. She is on the Category 1 plan and is following her eating plan approximately 40 % of the time. She states she is at the gym, doing weights, stairs, and treadmill for 45 minutes 2 times per week. Kristen Underwood continues to lose weight even though she is working on weight maintenance. She is uncertain how to eat to not lose but not gain.  Her weight is 131 lb (59.4 kg) today and has had a weight loss of 4 pounds over a period of 3 weeks since her last visit. She has lost 50 lbs since starting treatment with [name].  Shortness of Breath with Exercise Kristen Underwood notes increasing shortness of breath with exercising and seems to be worsening over time with weight gain. She notes getting out of breath sooner with activity than she used to. This has not gotten worse recently.  Kristen Underwood denies shortness of breath at rest or orthopnea.  Elevated ALT Kristen Underwood is on statin with mild elevated in ALT. She denies abdominal pain or jaundice, and has never been told of any liver problems in the past. She notes rare tylenol or ETOH use. She has not gained weight.  At risk for cardiovascular disease Kristen Underwood is at a higher than average risk for cardiovascular disease due to obesity. She currently denies any chest pain.  ASSESSMENT AND PLAN:  Shortness of breath on exertion  Elevated ALT measurement  At risk for heart disease  Class 1 obesity with serious comorbidity and body mass index (BMI) of 30.0 to 30.9 in adult, unspecified obesity type - Starting BMI greater then 30  PLAN:  Shortness of Breath with Exercise Kristen Underwood's shortness of breath appears to be obesity related and exercise induced. The indirect calorimeter results shows RMR is close to goal. She has agreed to continue to working on weight loss and gradually increase exercise, strengthening to treat her exercise induced  shortness of breath. If Kristen Underwood follows our instructions and loses weight without improvement of her shortness of breath, we will plan to refer to pulmonology. Kristen Underwood agrees to this plan.  Elevated ALT We discussed the likely diagnosis of non alcoholic fatty liver disease today and how this condition is obesity related. Kristen Underwood was educated on her risk of developing NASH or even liver failure and th only proven treatment for NAFLD was weight loss. Kristen Underwood agreed to continue with her weight loss efforts with healthier diet and exercise as an essential part of her treatment plan.  Cardiovascular risk counseling Kristen Underwood was given extended (15 minutes) coronary artery disease prevention counseling today. She is 63 y.o. female and has risk factors for heart disease including obesity. We discussed intensive lifestyle modifications today with an emphasis on specific weight loss instructions and strategies. Pt was also informed of the importance of increasing exercise and decreasing saturated fats to help prevent heart disease.  Obesity Kristen Underwood is currently in the action stage of change. As such, her goal is to maintain weight for now She has agreed to keep a food journal with 1200-1400 calories and 75+ grams of protein daily Kristen Underwood has been instructed to work up to a goal of 150 minutes of combined cardio and strengthening exercise per week for weight loss and overall health benefits. We discussed the following Behavioral Modification Strategies today: increasing lean protein intake, decreasing simple carbohydrates  and work on meal planning and easy cooking  plans Kristen Underwood's IC was repeated today, RMR approximately [place] has agreed to follow up with our clinic in 8 weeks. She was informed of the importance of frequent follow up visits to maximize her success with intensive lifestyle modifications for her multiple health conditions.  ALLERGIES: No Known Allergies  MEDICATIONS: Current Outpatient Medications on File  Prior to Visit  Medication Sig Dispense Refill  . aspirin 81 MG tablet Take 81 mg by mouth daily.    Marland Kitchen atorvastatin (LIPITOR) 20 MG tablet Take 1 tablet (20 mg total) by mouth daily. 90 tablet 1  . cholecalciferol (VITAMIN D) 1000 UNITS tablet Take 1,000 Units by mouth daily.      . Coenzyme Q10 (COQ10) 400 MG CAPS Take 1 tablet by mouth daily. 30 capsule 0  . fluticasone (FLONASE) 50 MCG/ACT nasal spray Place 2 sprays into both nostrils daily. 16 g 5  . Omega-3 Fatty Acids (FISH OIL) 1000 MG CAPS Take by mouth daily.    . Pyridoxine HCl (VITAMIN B-6) 500 MG tablet Take 500 mg by mouth daily.     No current facility-administered medications on file prior to visit.     PAST MEDICAL HISTORY: Past Medical History:  Diagnosis Date  . Back pain   . BRCA negative   . Cough, persistent   . Elevated cholesterol   . GERD (gastroesophageal reflux disease)   . Hyperlipidemia   . Hypertension    10/09/13:  pt denies  . Migraine    is the reason she takes BP med  . Neck pain     PAST SURGICAL HISTORY: Past Surgical History:  Procedure Laterality Date  . ABDOMINAL HYSTERECTOMY    . APPENDECTOMY    . TONSILLECTOMY AND ADENOIDECTOMY  1966    SOCIAL HISTORY: Social History   Tobacco Use  . Smoking status: Never Smoker  . Smokeless tobacco: Never Used  Substance Use Topics  . Alcohol use: Yes    Comment: RARE,SOCIAL  . Drug use: No    FAMILY HISTORY: Family History  Problem Relation Age of Onset  . Diabetes Mother   . Hyperlipidemia Mother   . Stroke Mother   . Depression Mother   . Obesity Mother   . Breast cancer Mother 42  . Cancer Father        PROSTATE  . Hypertension Father   . Mental illness Sister        had urosepsis--  and had schizo breakdown  . Breast cancer Maternal Aunt        in 108's  . Breast cancer Maternal Grandmother        in 38's  . Breast cancer Maternal Aunt        in 76's  . Breast cancer Maternal Aunt        in 79's  . Breast cancer  Maternal Aunt        in 70's    ROS: Review of Systems  Constitutional: Positive for weight loss.  Eyes:       Negative jaundice  Respiratory: Positive for shortness of breath (with exercise).   Cardiovascular: Negative for chest pain and orthopnea.  Gastrointestinal: Negative for abdominal pain.    PHYSICAL EXAM: Blood pressure 111/73, pulse 62, height _0  (1.575 m), weight 131 lb (59.4 kg), SpO2 99 %. Body mass index is 23.96 kg/m. Physical Exam Vitals signs reviewed.  Constitutional:      Appearance: Normal appearance. She is obese.  Cardiovascular:     Rate and  Rhythm: Normal rate.     Pulses: Normal pulses.  Pulmonary:     Effort: Pulmonary effort is normal.     Breath sounds: Normal breath sounds.  Musculoskeletal: Normal range of motion.  Skin:    General: Skin is warm and dry.  Neurological:     Mental Status: She is alert and oriented to person, place, and time.  Psychiatric:        Mood and Affect: Mood normal.        Behavior: Behavior normal.     RECENT LABS AND TESTS: BMET    Component Value Date/Time   NA 140 10/09/2018 0858   K 4.6 10/09/2018 0858   CL 101 10/09/2018 0858   CO2 24 10/09/2018 0858   GLUCOSE 89 10/09/2018 0858   GLUCOSE 110 (H) 02/05/2018 1050   BUN 23 10/09/2018 0858   CREATININE 1.02 (H) 10/09/2018 0858   CALCIUM 10.3 10/09/2018 0858   GFRNONAA 60 10/09/2018 0858   GFRAA 69 10/09/2018 0858   Lab Results  Component Value Date   HGBA1C 5.5 10/09/2018   HGBA1C 5.5 07/04/2018   HGBA1C 5.8 (H) 02/28/2018   HGBA1C 5.7 (H) 11/20/2017   Lab Results  Component Value Date   INSULIN 5.4 10/09/2018   INSULIN 8.5 07/04/2018   INSULIN 8.3 02/28/2018   INSULIN 11.2 11/20/2017   CBC    Component Value Date/Time   WBC 4.8 07/04/2018 1022   WBC 5.4 10/04/2013 1123   RBC 4.85 07/04/2018 1022   RBC 4.40 10/04/2013 1123   HGB 14.5 07/04/2018 1022   HCT 42.7 07/04/2018 1022   PLT 349.0 10/04/2013 1123   MCV 88 07/04/2018 1022     MCH 29.9 07/04/2018 1022   MCHC 34.0 07/04/2018 1022   MCHC 34.6 10/04/2013 1123   RDW 14.4 07/04/2018 1022   LYMPHSABS 1.3 07/04/2018 1022   MONOABS 0.3 10/04/2013 1123   EOSABS 0.3 07/04/2018 1022   BASOSABS 0.1 07/04/2018 1022   Iron/TIBC/Ferritin/ %Sat No results found for: IRON, TIBC, FERRITIN, IRONPCTSAT Lipid Panel     Component Value Date/Time   CHOL 219 (H) 10/09/2018 0858   TRIG 78 10/09/2018 0858   HDL 80 10/09/2018 0858   CHOLHDL 2 02/05/2018 1050   VLDL 9.6 02/05/2018 1050   LDLCALC 123 (H) 10/09/2018 0858   LDLDIRECT 151.8 07/22/2010 0904   Hepatic Function Panel     Component Value Date/Time   PROT 6.7 10/09/2018 0858   ALBUMIN 4.6 10/09/2018 0858   AST 30 10/09/2018 0858   ALT 48 (H) 10/09/2018 0858   ALKPHOS 92 10/09/2018 0858   BILITOT 0.4 10/09/2018 0858   BILIDIR 0.1 02/12/2015 1511   IBILI 0.4 09/29/2010 2010      Component Value Date/Time   TSH 2.000 07/04/2018 1022   TSH 1.880 11/20/2017 1158   TSH 1.30 10/04/2013 1123      OBESITY BEHAVIORAL INTERVENTION VISIT  Today's visit was # 19   Starting weight: 181 lbs Starting date: 11/20/17 Today's weight : 131 lbs  Today's date: 12/18/2018 Total lbs lost to date: 65    ASK: We discussed the diagnosis of obesity with Kristen Underwood today and Kristen Underwood agreed to give [name] permission to discuss obesity behavioral modification therapy today.  ASSESS: Kristen Underwood has the diagnosis of obesity and her BMI today is 23.95 Kristen Underwood is in the action stage of change   ADVISE: Kristen Underwood was educated on the multiple health risks of obesity as well as the benefit of weight loss to  improve her health. She was advised of the need for long term treatment and the importance of lifestyle modifications to improve her current health and to decrease her risk of future health problems.  AGREE: Multiple dietary modification options and treatment options were discussed and  Kristen Underwood agreed to follow the recommendations documented in the  above note.  ARRANGE: Kristen Underwood was educated on the importance of frequent visits to treat obesity as outlined per CMS and USPSTF guidelines and agreed to schedule her next follow up appointment today.  I, Trixie Dredge, am acting as transcriptionist for Dennard Nip, MD  I have reviewed the above documentation for accuracy and completeness, and I agree with the above. -Dennard Nip, MD

## 2019-01-17 ENCOUNTER — Other Ambulatory Visit: Payer: Self-pay

## 2019-01-17 ENCOUNTER — Encounter: Payer: Self-pay | Admitting: Family Medicine

## 2019-01-17 ENCOUNTER — Ambulatory Visit: Payer: 59 | Admitting: Family Medicine

## 2019-01-17 VITALS — BP 108/68 | HR 57 | Temp 98.3°F | Resp 12 | Ht 62.0 in | Wt 133.8 lb

## 2019-01-17 DIAGNOSIS — D224 Melanocytic nevi of scalp and neck: Secondary | ICD-10-CM

## 2019-01-17 NOTE — Progress Notes (Signed)
Patient ID: Kristen Underwood, female    DOB: [birth year]  Age: 63 y.o. MRN: [id number]    Subjective:  Subjective  HPI Kristen Underwood presents for itchy bump at base of scalp that has recently gotten bigger.  No other complaints.    Review of Systems  Constitutional: Negative for appetite change, diaphoresis, fatigue and unexpected weight change.  Eyes: Negative for pain, redness and visual disturbance.  Respiratory: Negative for cough, chest tightness, shortness of breath and wheezing.   Cardiovascular: Negative for chest pain, palpitations and leg swelling.  Endocrine: Negative for cold intolerance, heat intolerance, polydipsia, polyphagia and polyuria.  Genitourinary: Negative for difficulty urinating, dysuria and frequency.  Neurological: Negative for dizziness, light-headedness, numbness and headaches.    History Past Medical History:  Diagnosis Date  . Back pain   . BRCA negative   . Cough, persistent   . Elevated cholesterol   . GERD (gastroesophageal reflux disease)   . Hyperlipidemia   . Hypertension    10/09/13:  pt denies  . Migraine    is the reason she takes BP med  . Neck pain     She has a past surgical history that includes Abdominal hysterectomy; Appendectomy; and Tonsillectomy and adenoidectomy (1966).   Her family history includes Breast cancer in her maternal aunt, maternal aunt, maternal aunt, maternal aunt, and maternal grandmother; Breast cancer (age of onset: 59) in her mother; Cancer in her father; Depression in her mother; Diabetes in her mother; Hyperlipidemia in her mother; Hypertension in her father; Mental illness in her sister; Obesity in her mother; Stroke in her mother.She reports that she has never smoked. She has never used smokeless tobacco. She reports current alcohol use. She reports that she does not use drugs.  Current Outpatient Medications on File Prior to Visit  Medication Sig Dispense Refill  . aspirin 81 MG tablet Take 81 mg by mouth daily.     Marland Kitchen atorvastatin (LIPITOR) 20 MG tablet Take 1 tablet (20 mg total) by mouth daily. 90 tablet 1  . cholecalciferol (VITAMIN D) 1000 UNITS tablet Take 1,000 Units by mouth daily.      . Coenzyme Q10 (COQ10) 400 MG CAPS Take 1 tablet by mouth daily. 30 capsule 0  . fluticasone (FLONASE) 50 MCG/ACT nasal spray Place 2 sprays into both nostrils daily. 16 g 5  . Omega-3 Fatty Acids (FISH OIL) 1000 MG CAPS Take by mouth daily.    . Pyridoxine HCl (VITAMIN B-6) 500 MG tablet Take 500 mg by mouth daily.     No current facility-administered medications on file prior to visit.      Objective:  Objective  Physical Exam Vitals signs and nursing note reviewed.  Constitutional:      Appearance: She is well-developed.  HENT:     Head: Normocephalic and atraumatic.   Eyes:     Conjunctiva/sclera: Conjunctivae normal.  Neck:     Musculoskeletal: Normal range of motion and neck supple.     Thyroid: No thyromegaly.     Vascular: No carotid bruit or JVD.  Cardiovascular:     Rate and Rhythm: Normal rate and regular rhythm.     Heart sounds: Normal heart sounds. No murmur.  Pulmonary:     Effort: Pulmonary effort is normal. No respiratory distress.     Breath sounds: Normal breath sounds. No wheezing or rales.  Chest:     Chest wall: No tenderness.  Skin:    Findings: Lesion present.  Neurological:     Mental  Status: She is alert and oriented to person, place, and time.    BP 108/68 (BP Location: Right Arm, Cuff Size: Normal)   Pulse (!) 57   Temp 98.3 F (36.8 C) (Oral)   Resp 12   Ht 5' 2"  (1.575 m)   Wt 133 lb 12.8 oz (60.7 kg)   SpO2 98%   BMI 24.47 kg/m  Wt Readings from Last 3 Encounters:  01/17/19 133 lb 12.8 oz (60.7 kg)  12/18/18 131 lb (59.4 kg)  11/26/18 135 lb (61.2 kg)     Lab Results  Component Value Date   WBC 4.8 07/04/2018   HGB 14.5 07/04/2018   HCT 42.7 07/04/2018   PLT 349.0 10/04/2013   GLUCOSE 89 10/09/2018   CHOL 219 (H) 10/09/2018   TRIG 78  10/09/2018   HDL 80 10/09/2018   LDLDIRECT 151.8 07/22/2010   LDLCALC 123 (H) 10/09/2018   ALT 48 (H) 10/09/2018   AST 30 10/09/2018   NA 140 10/09/2018   K 4.6 10/09/2018   CL 101 10/09/2018   CREATININE 1.02 (H) 10/09/2018   BUN 23 10/09/2018   CO2 24 10/09/2018   TSH 2.000 07/04/2018   HGBA1C 5.5 10/09/2018    Mm 3d Screen Breast Bilateral  Result Date: 08/14/2018 CLINICAL DATA:  Screening. EXAM: DIGITAL SCREENING BILATERAL MAMMOGRAM WITH TOMO AND CAD COMPARISON:  Previous exam(s). ACR Breast Density Category c: The breast tissue is heterogeneously dense, which may obscure small masses. FINDINGS: There are no findings suspicious for malignancy. Images were processed with CAD. IMPRESSION: No mammographic evidence of malignancy. A result letter of this screening mammogram will be mailed directly to the patient. RECOMMENDATION: 1.  Screening mammogram in one year. (Code:SM-B-01Y) 2. Consider genetics assessment to determine the patient's lifetime risk of breast cancer given her family history. Per American Cancer Society guidelines, if the patient has a calculated lifetime risk of developing breast cancer of greater than 20%, annual screening MRI of the breasts would be recommended at the time of screening mammography. BI-RADS CATEGORY  1: Negative. Electronically Signed   By: Ammie Ferrier M.D.   On: 08/14/2018 16:40     Assessment & Plan:  Plan  I am having Kristen Underwood maintain her cholecalciferol, vitamin B-6, fluticasone, aspirin, Fish Oil, atorvastatin, and CoQ10.  No orders of the defined types were placed in this encounter.   Problem List Items Addressed This Visit    None    Visit Diagnoses    Nevus of scalp    -  Primary   Relevant Orders   Ambulatory referral to Dermatology      Follow-up: No follow-ups on file.  Ann Held, DO

## 2019-01-17 NOTE — Patient Instructions (Signed)
Mole  A mole is a colored (pigmented) growth on the skin. Moles are very common. They are usually harmless, but some moles can become cancerous over time.  What are the causes?  Moles are caused when pigmented skin cells grow together in clusters instead of spreading out in the skin as they normally do. The reason why the skin cells grow together in clusters is not known.  What increases the risk?  You are more likely to develop a mole if you:  · Have family members who have moles.  · Are white.  · Have blond hair.  · Are often outdoors and exposed to the sun.  · Received phototherapy when you were a newborn baby.  · Are female.  What are the signs or symptoms?  A mole may be:  · Brown or black.  · Flat or raised.  · Smooth or wrinkled.  How is this diagnosed?  A mole is diagnosed with a skin exam. If your health care provider thinks a mole may be cancerous, all or part of the mole will be removed for testing (biopsy).  How is this treated?  Most moles are noncancerous (benign) and do not require treatment. If a mole is found to be cancerous, it will be removed. You may also choose to have a mole removed if it is causing pain or if you do not like the way it looks.  Follow these instructions at home:    General instructions    · Every month, look for new moles and check your existing moles for changes. This is important because a change in a mole can mean that the mole has become cancerous.  · ABCDE changes in a mole indicate that you should be evaluated by your health care provider. ABCDE stands for:  ? Asymmetry. This means the mole has an irregular shape. It is not round or oval.  ? Border. This means the mole has an irregular or bumpy border.  ? Color. This means the mole has multiple colors in it, including brown, black, blue, red, or tan. Note that it is normal for moles to get darker when a woman is pregnant or takes birth control pills.  ? Diameter. This means the mole is more than 0.2 inches (6 mm)  across.  ? Evolving. This refers to any unusual changes or symptoms in the mole, such as pain, itching, stinging, sensitivity, or bleeding.  · If you have a large number of moles, see a skin doctor (dermatologist) at least one time every year for a full-body skin check.  Lifestyle  · When you are outdoors, wear sunscreen with SPF 30 (sun protection factor 30) or higher.  · Use an adequate amount of sunscreen to cover exposed areas of skin. Put it on 30 minutes before you go out. Reapply it every 2 hours or anytime you come out of the water.  · When you are out in the sun, wear a broad-brimmed hat and clothing that covers your arms and legs. Wear wraparound sunglasses.  Contact a health care provider if:  · The size, shape, borders, or color of your mole changes.  · Your mole, or the skin near the mole, becomes painful, sore, red, or swollen.  · Your mole:  ? Develops more than one color.  ? Itches or bleeds.  ? Becomes scaly, sheds skin, or oozes fluid.  ? Becomes flat or develops raised areas.  ? Becomes hard or soft.  · You develop a new   mole.  Summary  · A mole is a colored (pigmented) growth on the skin. Moles are very common. They are usually harmless, but some moles can become cancerous over time.  · Every month, look for new moles and check your existing moles for changes. This is important because a change in a mole can mean that the mole has become cancerous.  · If you have a large number of moles, see a skin doctor (dermatologist) at least one time every year for a full-body skin check.  · When you are outdoors, wear sunscreen with SPF 30 (sun protection factor 30) or higher. Reapply it every 2 hours or anytime you come out of the water.  · Contact a health care provider if you notice changes in a mole or if you develop a new mole.  This information is not intended to replace advice given to you by your health care provider. Make sure you discuss any questions you have with your health care  provider.  Document Released: 07/19/2001 Document Revised: 03/20/2018 Document Reviewed: 03/20/2018  [employer] Interactive Patient Education © 2019 [employer]

## 2019-01-30 ENCOUNTER — Encounter (INDEPENDENT_AMBULATORY_CARE_PROVIDER_SITE_OTHER): Payer: Self-pay | Admitting: Family Medicine

## 2019-02-04 ENCOUNTER — Encounter (INDEPENDENT_AMBULATORY_CARE_PROVIDER_SITE_OTHER): Payer: Self-pay

## 2019-02-12 ENCOUNTER — Ambulatory Visit (INDEPENDENT_AMBULATORY_CARE_PROVIDER_SITE_OTHER): Payer: 59 | Admitting: Family Medicine

## 2019-04-11 ENCOUNTER — Encounter: Payer: Self-pay | Admitting: Family Medicine

## 2019-04-12 ENCOUNTER — Encounter: Payer: Self-pay | Admitting: Family Medicine

## 2019-04-12 ENCOUNTER — Ambulatory Visit: Payer: 59 | Admitting: Family Medicine

## 2019-04-12 ENCOUNTER — Other Ambulatory Visit: Payer: Self-pay

## 2019-04-12 VITALS — BP 119/72 | HR 78 | Temp 98.2°F | Resp 18 | Ht 62.0 in | Wt 130.0 lb

## 2019-04-12 DIAGNOSIS — R102 Pelvic and perineal pain: Secondary | ICD-10-CM | POA: Diagnosis not present

## 2019-04-12 DIAGNOSIS — R3129 Other microscopic hematuria: Secondary | ICD-10-CM | POA: Diagnosis not present

## 2019-04-12 LAB — POCT URINALYSIS DIP (MANUAL ENTRY)
Bilirubin, UA: NEGATIVE
Glucose, UA: NEGATIVE mg/dL
Ketones, POC UA: NEGATIVE mg/dL
Nitrite, UA: NEGATIVE
Spec Grav, UA: 1.025 (ref 1.010–1.025)
Urobilinogen, UA: 0.2 E.U./dL
pH, UA: 6 (ref 5.0–8.0)

## 2019-04-12 MED ORDER — FLUCONAZOLE 150 MG PO TABS: ORAL_TABLET | ORAL | 0 refills | Status: DC

## 2019-04-12 MED ORDER — NITROFURANTOIN MONOHYD MACRO 100 MG PO CAPS: 100.0000 mg | ORAL_CAPSULE | Freq: Two times a day (BID) | ORAL | 0 refills | Status: DC

## 2019-04-12 NOTE — Progress Notes (Signed)
Patient ID: Kristen Underwood, female    DOB: 1956/05/01  Age: 63 y.o. MRN: [id number]    Subjective:  Subjective  HPI Kristen Underwood presents for suprapupic pain and urinary frequency with dysuira.  No fevers   Symptoms x few days   Review of Systems  Constitutional: Negative for appetite change, diaphoresis, fatigue, fever and unexpected weight change.  Eyes: Negative for pain, redness and visual disturbance.  Respiratory: Negative for cough, chest tightness, shortness of breath and wheezing.   Cardiovascular: Negative for chest pain, palpitations and leg swelling.  Endocrine: Negative for cold intolerance, heat intolerance, polydipsia, polyphagia and polyuria.  Genitourinary: Positive for dysuria, frequency, pelvic pain and urgency. Negative for decreased urine volume, difficulty urinating, vaginal bleeding and vaginal discharge.  Neurological: Negative for dizziness, light-headedness, numbness and headaches.    History Past Medical History:  Diagnosis Date  . Back pain   . BRCA negative   . Cough, persistent   . Elevated cholesterol   . GERD (gastroesophageal reflux disease)   . Hyperlipidemia   . Hypertension    10/09/13:  pt denies  . Migraine    is the reason she takes BP med  . Neck pain     She has a past surgical history that includes Abdominal hysterectomy; Appendectomy; and Tonsillectomy and adenoidectomy (1966).   Her family history includes Breast cancer in her maternal aunt, maternal aunt, maternal aunt, maternal aunt, and maternal grandmother; Breast cancer (age of onset: 56) in her mother; Cancer in her father; Depression in her mother; Diabetes in her mother; Hyperlipidemia in her mother; Hypertension in her father; Mental illness in her sister; Obesity in her mother; Stroke in her mother.She reports that she has never smoked. She has never used smokeless tobacco. She reports current alcohol use. She reports that she does not use drugs.  Current Outpatient Medications  on File Prior to Visit  Medication Sig Dispense Refill  . aspirin 81 MG tablet Take 81 mg by mouth daily.    Marland Kitchen atorvastatin (LIPITOR) 20 MG tablet Take 1 tablet (20 mg total) by mouth daily. 90 tablet 1  . cholecalciferol (VITAMIN D) 1000 UNITS tablet Take 1,000 Units by mouth daily.      . Coenzyme Q10 (COQ10) 400 MG CAPS Take 1 tablet by mouth daily. 30 capsule 0  . fluticasone (FLONASE) 50 MCG/ACT nasal spray Place 2 sprays into both nostrils daily. 16 g 5  . Omega-3 Fatty Acids (FISH OIL) 1000 MG CAPS Take by mouth daily.    . Pyridoxine HCl (VITAMIN B-6) 500 MG tablet Take 500 mg by mouth daily.     No current facility-administered medications on file prior to visit.      Objective:  Objective  Physical Exam Vitals signs and nursing note reviewed.  Constitutional:      Appearance: She is well-developed.  HENT:     Head: Normocephalic and atraumatic.  Eyes:     Conjunctiva/sclera: Conjunctivae normal.  Neck:     Musculoskeletal: Normal range of motion and neck supple.     Thyroid: No thyromegaly.     Vascular: No carotid bruit or JVD.  Cardiovascular:     Rate and Rhythm: Normal rate and regular rhythm.     Heart sounds: Normal heart sounds. No murmur.  Pulmonary:     Effort: Pulmonary effort is normal. No respiratory distress.     Breath sounds: Normal breath sounds. No wheezing or rales.  Chest:     Chest wall: No tenderness.  Abdominal:  Tenderness: There is abdominal tenderness in the suprapubic area. There is no right CVA tenderness, left CVA tenderness, guarding or rebound. Negative signs include Murphy's sign.  Neurological:     Mental Status: She is alert and oriented to person, place, and time.    BP 119/72 (BP Location: Right Arm, Patient Position: Sitting, Cuff Size: Normal)   Pulse 78   Temp 98.2 F (36.8 C) (Oral)   Resp 18   Ht 5' 2"  (1.575 m)   Wt 130 lb (59 kg)   SpO2 99%   BMI 23.78 kg/m  Wt Readings from Last 3 Encounters:  04/12/19 130 lb  (59 kg)  01/17/19 133 lb 12.8 oz (60.7 kg)  12/18/18 131 lb (59.4 kg)     Lab Results  Component Value Date   WBC 4.8 07/04/2018   HGB 14.5 07/04/2018   HCT 42.7 07/04/2018   PLT 349.0 10/04/2013   GLUCOSE 89 10/09/2018   CHOL 219 (H) 10/09/2018   TRIG 78 10/09/2018   HDL 80 10/09/2018   LDLDIRECT 151.8 07/22/2010   LDLCALC 123 (H) 10/09/2018   ALT 48 (H) 10/09/2018   AST 30 10/09/2018   NA 140 10/09/2018   K 4.6 10/09/2018   CL 101 10/09/2018   CREATININE 1.02 (H) 10/09/2018   BUN 23 10/09/2018   CO2 24 10/09/2018   TSH 2.000 07/04/2018   HGBA1C 5.5 10/09/2018    Mm 3d Screen Breast Bilateral  Result Date: 08/14/2018 CLINICAL DATA:  Screening. EXAM: DIGITAL SCREENING BILATERAL MAMMOGRAM WITH TOMO AND CAD COMPARISON:  Previous exam(s). ACR Breast Density Category c: The breast tissue is heterogeneously dense, which may obscure small masses. FINDINGS: There are no findings suspicious for malignancy. Images were processed with CAD. IMPRESSION: No mammographic evidence of malignancy. A result letter of this screening mammogram will be mailed directly to the patient. RECOMMENDATION: 1.  Screening mammogram in one year. (Code:SM-B-01Y) 2. Consider genetics assessment to determine the patient's lifetime risk of breast cancer given her family history. Per American Cancer Society guidelines, if the patient has a calculated lifetime risk of developing breast cancer of greater than 20%, annual screening MRI of the breasts would be recommended at the time of screening mammography. BI-RADS CATEGORY  1: Negative. Electronically Signed   By: Ammie Ferrier M.D.   On: 08/14/2018 16:40     Assessment & Plan:  Plan  I am having Kristen Underwood start on nitrofurantoin (macrocrystal-monohydrate) and fluconazole. I am also having her maintain her cholecalciferol, vitamin B-6, fluticasone, aspirin, Fish Oil, atorvastatin, and CoQ10.  Meds ordered this encounter  Medications  . nitrofurantoin,  macrocrystal-monohydrate, (MACROBID) 100 MG capsule    Sig: Take 1 capsule (100 mg total) by mouth 2 (two) times daily.    Dispense:  14 capsule    Refill:  0  . fluconazole (DIFLUCAN) 150 MG tablet    Sig: 1 po x1, may repeat in 3 days prn    Dispense:  2 tablet    Refill:  0    ua-- + leuk and blood   Problem List Items Addressed This Visit    None    Visit Diagnoses    Suprapubic pain    -  Primary   Relevant Medications   nitrofurantoin, macrocrystal-monohydrate, (MACROBID) 100 MG capsule   fluconazole (DIFLUCAN) 150 MG tablet   Other Relevant Orders   Urine Culture   Pelvic pain       Relevant Orders   POCT urinalysis dipstick (Completed)   Other  microscopic hematuria       Relevant Medications   nitrofurantoin, macrocrystal-monohydrate, (MACROBID) 100 MG capsule   fluconazole (DIFLUCAN) 150 MG tablet   Other Relevant Orders   Urine Culture      Follow-up: Return if symptoms worsen or fail to improve.  Ann Held, DO

## 2019-04-12 NOTE — Patient Instructions (Signed)
Hematuria, Adult  Hematuria is blood in the urine. Blood may be visible in the urine, or it may be identified with a test. This condition can be caused by infections of the bladder, urethra, kidney, or prostate. Other possible causes include:   Kidney stones.   Cancer of the urinary tract.   Too much calcium in the urine.   Conditions that are passed from parent to child (inherited conditions).   Exercise that requires a lot of energy.  Infections can usually be treated with medicine, and a kidney stone usually will pass through your urine. If neither of these is the cause of your hematuria, more tests may be needed to identify the cause of your symptoms.  It is very important to tell your health care provider about any blood in your urine, even if it is painless or the blood stops without treatment. Blood in the urine, when it happens and then stops and then happens again, can be a symptom of a very serious condition, including cancer. There is no pain in the initial stages of many urinary cancers.  Follow these instructions at home:  Medicines   Take over-the-counter and prescription medicines only as told by your health care provider.   If you were prescribed an antibiotic medicine, take it as told by your health care provider. Do not stop taking the antibiotic even if you start to feel better.  Eating and drinking   Drink enough fluid to keep your urine clear or pale yellow. It is recommended that you drink 3-4 quarts (2.8-3.8 L) a day. If you have been diagnosed with an infection, it is recommended that you drink cranberry juice in addition to large amounts of water.   Avoid caffeine, tea, and carbonated beverages. These tend to irritate the bladder.   Avoid alcohol because it may irritate the prostate (men).  General instructions   If you have been diagnosed with a kidney stone, follow your health care provider's instructions about straining your urine to catch the stone.   Empty your bladder  often. Avoid holding urine for long periods of time.   If you are female:  ? After a bowel movement, wipe from front to back and use each piece of toilet paper only once.  ? Empty your bladder before and after sex.   Pay attention to any changes in your symptoms. Tell your health care provider about any changes or any new symptoms.   It is your responsibility to get your test results. Ask your health care provider, or the department performing the test, when your results will be ready.   Keep all follow-up visits as told by your health care provider. This is important.  Contact a health care provider if:   You develop back pain.   You have a fever.   You have nausea or vomiting.   Your symptoms do not improve after 3 days.   Your symptoms get worse.  Get help right away if:   You develop severe vomiting and are unable take medicine without vomiting.   You develop severe pain in your back or abdomen even though you are taking medicine.   You pass a large amount of blood in your urine.   You pass blood clots in your urine.   You feel very weak or like you might faint.   You faint.  Summary   Hematuria is blood in the urine. It has many possible causes.   It is very important that you tell   your health care provider about any blood in your urine, even if it is painless or the blood stops without treatment.   Take over-the-counter and prescription medicines only as told by your health care provider.   Drink enough fluid to keep your urine clear or pale yellow.  This information is not intended to replace advice given to you by your health care provider. Make sure you discuss any questions you have with your health care provider.  Document Released: 10/24/2005 Document Revised: 11/26/2016 Document Reviewed: 11/26/2016  Elsevier Interactive Patient Education  2019 [employer]

## 2019-04-14 LAB — URINE CULTURE
MICRO NUMBER:: 541674
SPECIMEN QUALITY:: ADEQUATE

## 2019-04-15 ENCOUNTER — Encounter: Payer: Self-pay | Admitting: Family Medicine

## 2019-04-15 NOTE — Telephone Encounter (Signed)
Patient seen on 04/12/19

## 2019-04-18 ENCOUNTER — Other Ambulatory Visit: Payer: Self-pay | Admitting: *Deleted

## 2019-04-18 MED ORDER — CIPROFLOXACIN HCL 250 MG PO TABS: 250.0000 mg | ORAL_TABLET | Freq: Two times a day (BID) | ORAL | 0 refills | Status: AC

## 2019-05-15 ENCOUNTER — Other Ambulatory Visit: Payer: Self-pay | Admitting: Family Medicine

## 2019-05-15 DIAGNOSIS — I10 Essential (primary) hypertension: Secondary | ICD-10-CM

## 2019-05-15 DIAGNOSIS — E785 Hyperlipidemia, unspecified: Secondary | ICD-10-CM

## 2019-05-15 DIAGNOSIS — E782 Mixed hyperlipidemia: Secondary | ICD-10-CM

## 2019-06-17 ENCOUNTER — Encounter: Payer: Self-pay | Admitting: Family Medicine

## 2019-09-02 ENCOUNTER — Other Ambulatory Visit: Payer: Self-pay | Admitting: Family Medicine

## 2019-09-02 DIAGNOSIS — Z1231 Encounter for screening mammogram for malignant neoplasm of breast: Secondary | ICD-10-CM

## 2019-09-20 ENCOUNTER — Other Ambulatory Visit: Payer: Self-pay

## 2019-09-20 ENCOUNTER — Ambulatory Visit (INDEPENDENT_AMBULATORY_CARE_PROVIDER_SITE_OTHER): Payer: BC Managed Care – PPO

## 2019-09-20 DIAGNOSIS — Z23 Encounter for immunization: Secondary | ICD-10-CM

## 2019-09-20 NOTE — Progress Notes (Signed)
Patient here today for shingrix vaccine. 0.5mL given in left deltoid IM. Patient tolerated well.  

## 2019-10-22 ENCOUNTER — Ambulatory Visit
Admission: RE | Admit: 2019-10-22 | Discharge: 2019-10-22 | Disposition: A | Discharge status: Home / Self Care | Payer: 59 | Source: Ambulatory Visit | Attending: Family Medicine | Admitting: Family Medicine

## 2019-10-22 ENCOUNTER — Other Ambulatory Visit: Payer: Self-pay

## 2019-10-22 DIAGNOSIS — Z1231 Encounter for screening mammogram for malignant neoplasm of breast: Secondary | ICD-10-CM

## 2019-11-12 ENCOUNTER — Other Ambulatory Visit: Payer: Self-pay | Admitting: Family Medicine

## 2019-11-12 DIAGNOSIS — E785 Hyperlipidemia, unspecified: Secondary | ICD-10-CM

## 2019-11-12 DIAGNOSIS — E782 Mixed hyperlipidemia: Secondary | ICD-10-CM

## 2019-11-12 DIAGNOSIS — I10 Essential (primary) hypertension: Secondary | ICD-10-CM

## 2020-03-18 ENCOUNTER — Other Ambulatory Visit: Payer: Self-pay | Admitting: Family Medicine

## 2020-03-18 DIAGNOSIS — E782 Mixed hyperlipidemia: Secondary | ICD-10-CM

## 2020-03-18 DIAGNOSIS — E785 Hyperlipidemia, unspecified: Secondary | ICD-10-CM

## 2020-03-18 DIAGNOSIS — I10 Essential (primary) hypertension: Secondary | ICD-10-CM

## 2020-03-18 DIAGNOSIS — Z0000 Encounter for general adult medical examination without abnormal findings: Secondary | ICD-10-CM

## 2020-03-18 MED ORDER — ATORVASTATIN CALCIUM 20 MG PO TABS: 20.0000 mg | ORAL_TABLET | Freq: Every day | ORAL | 0 refills | Status: DC

## 2020-03-18 NOTE — Telephone Encounter (Signed)
Medication: atorvastatin (LIPITOR) 20 MG tablet   Patient has a CPE for the end of May but she is out of meds (She also request for labs to be done before that appointment if you let me know when order placed I will call to get her scheduled)    Has the patient contacted their pharmacy? No. (If no, request that the patient contact the pharmacy for the refill.) (If yes, when and what did the pharmacy advise?)  Preferred Pharmacy (with phone number or street name):  [id number] - [place], [place] [address] Phone:  [phone]  Fax:  [phone]      Agent: Please be advised that RX refills may take up to 3 business days. We ask that you follow-up with your pharmacy.

## 2020-03-18 NOTE — Telephone Encounter (Signed)
I also sent in a 30 day supply.

## 2020-03-23 ENCOUNTER — Encounter: Payer: Self-pay | Admitting: Family Medicine

## 2020-03-26 HISTORY — PX: CATARACT EXTRACTION: SUR2

## 2020-03-31 ENCOUNTER — Other Ambulatory Visit (INDEPENDENT_AMBULATORY_CARE_PROVIDER_SITE_OTHER): Payer: BC Managed Care – PPO

## 2020-03-31 ENCOUNTER — Other Ambulatory Visit: Payer: Self-pay

## 2020-03-31 DIAGNOSIS — E785 Hyperlipidemia, unspecified: Secondary | ICD-10-CM

## 2020-03-31 DIAGNOSIS — Z0000 Encounter for general adult medical examination without abnormal findings: Secondary | ICD-10-CM

## 2020-03-31 LAB — LIPID PANEL
Cholesterol: 248 mg/dL — ABNORMAL HIGH (ref 0–200)
HDL: 89.2 mg/dL (ref >39.00)
LDL Cholesterol: 146 mg/dL — ABNORMAL HIGH (ref 0–99)
NonHDL: 158.65
Total CHOL/HDL Ratio: 3
Triglycerides: 61 mg/dL (ref 0.0–149.0)
VLDL: 12.2 mg/dL (ref 0.0–40.0)

## 2020-03-31 LAB — COMPREHENSIVE METABOLIC PANEL
ALT: 44 U/L — ABNORMAL HIGH (ref 0–35)
AST: 32 U/L (ref 0–37)
Albumin: 4.7 g/dL (ref 3.5–5.2)
Alkaline Phosphatase: 68 U/L (ref 39–117)
BUN: 25 mg/dL — ABNORMAL HIGH (ref 6–23)
CO2: 29 mEq/L (ref 19–32)
Calcium: 10.2 mg/dL (ref 8.4–10.5)
Chloride: 105 mEq/L (ref 96–112)
Creatinine, Ser: 0.91 mg/dL (ref 0.40–1.20)
GFR: 62.66 mL/min (ref >60.00)
Glucose, Bld: 99 mg/dL (ref 70–99)
Potassium: 4.8 mEq/L (ref 3.5–5.1)
Sodium: 141 mEq/L (ref 135–145)
Total Bilirubin: 0.6 mg/dL (ref 0.2–1.2)
Total Protein: 6.7 g/dL (ref 6.0–8.3)

## 2020-03-31 LAB — CBC WITH DIFFERENTIAL/PLATELET
Basophils Absolute: 0.1 10*3/uL (ref 0.0–0.1)
Basophils Relative: 1.3 % (ref 0.0–3.0)
Eosinophils Absolute: 0.3 10*3/uL (ref 0.0–0.7)
Eosinophils Relative: 5.2 % — ABNORMAL HIGH (ref 0.0–5.0)
HCT: 44.3 % (ref 36.0–46.0)
Hemoglobin: 15.2 g/dL — ABNORMAL HIGH (ref 12.0–15.0)
Lymphocytes Relative: 33.8 % (ref 12.0–46.0)
Lymphs Abs: 1.7 10*3/uL (ref 0.7–4.0)
MCHC: 34.3 g/dL (ref 30.0–36.0)
MCV: 92.1 fl (ref 78.0–100.0)
Monocytes Absolute: 0.3 10*3/uL (ref 0.1–1.0)
Monocytes Relative: 5.8 % (ref 3.0–12.0)
Neutro Abs: 2.7 10*3/uL (ref 1.4–7.7)
Neutrophils Relative %: 53.9 % (ref 43.0–77.0)
Platelets: 278 10*3/uL (ref 150.0–400.0)
RBC: 4.8 Mil/uL (ref 3.87–5.11)
RDW: 13.4 % (ref 11.5–15.5)
WBC: 5 10*3/uL (ref 4.0–10.5)

## 2020-03-31 LAB — TSH: TSH: 2.01 u[IU]/mL (ref 0.35–4.50)

## 2020-04-02 ENCOUNTER — Ambulatory Visit: Payer: BC Managed Care – PPO | Admitting: Family Medicine

## 2020-04-03 ENCOUNTER — Other Ambulatory Visit: Payer: Self-pay | Admitting: Family Medicine

## 2020-04-03 ENCOUNTER — Encounter: Payer: Self-pay | Admitting: Family Medicine

## 2020-04-03 ENCOUNTER — Ambulatory Visit (INDEPENDENT_AMBULATORY_CARE_PROVIDER_SITE_OTHER): Payer: BC Managed Care – PPO | Admitting: Family Medicine

## 2020-04-03 ENCOUNTER — Other Ambulatory Visit: Payer: Self-pay

## 2020-04-03 VITALS — BP 124/80 | HR 73 | Temp 97.6°F | Resp 18 | Ht 62.0 in | Wt 129.4 lb

## 2020-04-03 DIAGNOSIS — Z0000 Encounter for general adult medical examination without abnormal findings: Secondary | ICD-10-CM | POA: Diagnosis not present

## 2020-04-03 DIAGNOSIS — E785 Hyperlipidemia, unspecified: Secondary | ICD-10-CM | POA: Diagnosis not present

## 2020-04-03 DIAGNOSIS — L853 Xerosis cutis: Secondary | ICD-10-CM

## 2020-04-03 MED ORDER — AMMONIUM LACTATE 12 % EX CREA: TOPICAL_CREAM | CUTANEOUS | 0 refills | Status: DC | PRN

## 2020-04-03 NOTE — Progress Notes (Signed)
Subjective:     Kristen Underwood is a 64 y.o. female and is here for a comprehensive physical exam. The patient reports no problems.-- except extremely dry skin  Social History   Socioeconomic History  . Marital status: Married    Spouse name: Copywriter, advertising  . Number of children: 3  . Years of education: Not on file  . Highest education level: Not on file  Occupational History  . Occupation: Engineer, building services: [employer]  Tobacco Use  . Smoking status: Never Smoker  . Smokeless tobacco: Never Used  Substance and Sexual Activity  . Alcohol use: Yes    Comment: RARE,SOCIAL  . Drug use: No  . Sexual activity: Yes    Partners: Male  Other Topics Concern  . Not on file  Social History Narrative   Exercise--not lately   Social Determinants of Health   Financial Resource Strain:   . Difficulty of Paying Living Expenses:   Food Insecurity:   . Worried About Charity fundraiser in the Last Year:   . Arboriculturist in the Last Year:   Transportation Needs:   . Film/video editor (Medical):   Marland Kitchen Lack of Transportation (Non-Medical):   Physical Activity:   . Days of Exercise per Week:   . Minutes of Exercise per Session:   Stress:   . Feeling of Stress :   Social Connections:   . Frequency of Communication with Friends and Family:   . Frequency of Social Gatherings with Friends and Family:   . Attends Religious Services:   . Active Member of Clubs or Organizations:   . Attends Archivist Meetings:   Marland Kitchen Marital Status:   Intimate Partner Violence:   . Fear of Current or Ex-Partner:   . Emotionally Abused:   Marland Kitchen Physically Abused:   . Sexually Abused:    Health Maintenance  Topic Date Due  . Hepatitis C Screening  Never done  . HIV Screening  Never done  . TETANUS/TDAP  10/20/2014  . PAP SMEAR-Modifier  10/04/2016  . COLONOSCOPY  10/23/2017  . INFLUENZA VACCINE  06/07/2020  . MAMMOGRAM  10/21/2021  . COVID-19 Vaccine  Completed    The  following portions of the patient's history were reviewed and updated as appropriate:  She  has a past medical history of Back pain, BRCA negative, Cough, persistent, Elevated cholesterol, GERD (gastroesophageal reflux disease), Hyperlipidemia, Hypertension, Migraine, and Neck pain. She does not have any pertinent problems on file. She  has a past surgical history that includes Abdominal hysterectomy; Appendectomy; and Tonsillectomy and adenoidectomy (1966). Her family history includes Breast cancer in her maternal aunt, maternal aunt, maternal aunt, maternal aunt, and maternal grandmother; Breast cancer (age of onset: 6) in her mother; Cancer in her father; Depression in her mother; Diabetes in her mother; Hyperlipidemia in her mother; Hypertension in her father; Mental illness in her sister; Obesity in her mother; Stroke in her mother. She  reports that she has never smoked. She has never used smokeless tobacco. She reports current alcohol use. She reports that she does not use drugs. She has a current medication list which includes the following prescription(s): aspirin, atorvastatin, cholecalciferol, coq10, durezol, fluticasone, gatifloxacin, fish oil, prolensa, and vitamin b-6. Current Outpatient Medications on File Prior to Visit  Medication Sig Dispense Refill  . aspirin 81 MG tablet Take 81 mg by mouth daily.    Marland Kitchen atorvastatin (LIPITOR) 20 MG tablet Take 1 tablet (20 mg  total) by mouth daily. 30 tablet 0  . cholecalciferol (VITAMIN D) 1000 UNITS tablet Take 1,000 Units by mouth daily.      . Coenzyme Q10 (COQ10) 400 MG CAPS Take 1 tablet by mouth daily. 30 capsule 0  . DUREZOL 0.05 % EMUL     . fluticasone (FLONASE) 50 MCG/ACT nasal spray Place 2 sprays into both nostrils daily. 16 g 5  . gatifloxacin (ZYMAXID) 0.5 % SOLN Place 1 drop into the right eye 4 (four) times daily.    . Omega-3 Fatty Acids (FISH OIL) 1000 MG CAPS Take by mouth daily.    Marland Kitchen PROLENSA 0.07 % SOLN SMARTSIG:1 Drop(s)  Left Eye Every Evening    . Pyridoxine HCl (VITAMIN B-6) 500 MG tablet Take 500 mg by mouth daily.     No current facility-administered medications on file prior to visit.   She has No Known Allergies..  Review of Systems Review of Systems  Constitutional: Negative for activity change, appetite change and fatigue.  HENT: Negative for hearing loss, congestion, tinnitus and ear discharge.  dentist q80mEyes: Negative for visual disturbance (see optho q1y -- vision corrected to 20/20 with glasses).  Respiratory: Negative for cough, chest tightness and shortness of breath.   Cardiovascular: Negative for chest pain, palpitations and leg swelling.  Gastrointestinal: Negative for abdominal pain, diarrhea, constipation and abdominal distention.  Genitourinary: Negative for urgency, frequency, decreased urine volume and difficulty urinating.  Musculoskeletal: Negative for back pain, arthralgias and gait problem.  Skin: Negative for color change, pallor and rash.  Neurological: Negative for dizziness, light-headedness, numbness and headaches.  Hematological: Negative for adenopathy. Does not bruise/bleed easily.  Psychiatric/Behavioral: Negative for suicidal ideas, confusion, sleep disturbance, self-injury, dysphoric mood, decreased concentration and agitation.   Health Maintenance  Topic Date Due  . Hepatitis C Screening  Never done  . HIV Screening  Never done  . TETANUS/TDAP  10/20/2014  . PAP SMEAR-Modifier  10/04/2016  . COLONOSCOPY  10/23/2017  . INFLUENZA VACCINE  06/07/2020  . MAMMOGRAM  10/21/2021  . COVID-19 Vaccine  Completed     Objective:    BP 124/80 (BP Location: Right Arm, Patient Position: Sitting, Cuff Size: Normal)   Pulse 73   Temp 97.6 F (36.4 C) (Temporal)   Resp 18   Ht 5' 2"  (1.575 m)   Wt 129 lb 6.4 oz (58.7 kg)   SpO2 97%   BMI 23.67 kg/m  General appearance: alert, cooperative, appears stated age and no distress Head: Normocephalic, without obvious  abnormality, atraumatic Eyes: conjunctivae/corneas clear. PERRL, EOM's intact. Fundi benign. Ears: normal TM's and external ear canals both ears Neck: no adenopathy, no carotid bruit, no JVD, supple, symmetrical, trachea midline and thyroid not enlarged, symmetric, no tenderness/mass/nodules Back: symmetric, no curvature. ROM normal. No CVA tenderness. Lungs: clear to auscultation bilaterally Breasts: normal appearance, no masses or tenderness Heart: regular rate and rhythm, S1, S2 normal, no murmur, click, rub or gallop Abdomen: soft, non-tender; bowel sounds normal; no masses,  no organomegaly Pelvic: not indicated; status post hysterectomy, negative ROS Extremities: extremities normal, atraumatic, no cyanosis or edema Pulses: 2+ and symmetric Skin: Skin color, texture, turgor normal. No rashes or lesions -- skin dry Lymph nodes: Cervical, supraclavicular, and axillary nodes normal. Neurologic: Alert and oriented X 3, normal strength and tone. Normal symmetric reflexes. Normal coordination and gait    Assessment:    Healthy female exam.      Plan:    ghm utd Check labs  See After  Visit Summary for Counseling Recommendations    1. Hyperlipidemia, unspecified hyperlipidemia type Tolerating statin, encouraged heart healthy diet, avoid trans fats, minimize simple carbs and saturated fats. Increase exercise as tolerated - Lipid panel; Future - Comprehensive metabolic panel; Future  2. Dry skin dermatitis  - ammonium lactate (LAC-HYDRIN) 12 % cream; Apply topically as needed for dry skin.  Dispense: 385 g; Refill: 0  3. Preventative health care See above

## 2020-04-03 NOTE — Progress Notes (Signed)
Subjective:     Kristen Underwood is a 64 y.o. female and is here for a comprehensive physical exam. The patient reports no problems.  Social History   Socioeconomic History  . Marital status: Married    Spouse name: Copywriter, advertising  . Number of children: 3  . Years of education: Not on file  . Highest education level: Not on file  Occupational History  . Occupation: Engineer, building services: [employer]  Tobacco Use  . Smoking status: Never Smoker  . Smokeless tobacco: Never Used  Substance and Sexual Activity  . Alcohol use: Yes    Comment: RARE,SOCIAL  . Drug use: No  . Sexual activity: Yes    Partners: Male  Other Topics Concern  . Not on file  Social History Narrative   Exercise--not lately   Social Determinants of Health   Financial Resource Strain:   . Difficulty of Paying Living Expenses:   Food Insecurity:   . Worried About Charity fundraiser in the Last Year:   . Arboriculturist in the Last Year:   Transportation Needs:   . Film/video editor (Medical):   Marland Kitchen Lack of Transportation (Non-Medical):   Physical Activity:   . Days of Exercise per Week:   . Minutes of Exercise per Session:   Stress:   . Feeling of Stress :   Social Connections:   . Frequency of Communication with Friends and Family:   . Frequency of Social Gatherings with Friends and Family:   . Attends Religious Services:   . Active Member of Clubs or Organizations:   . Attends Archivist Meetings:   Marland Kitchen Marital Status:   Intimate Partner Violence:   . Fear of Current or Ex-Partner:   . Emotionally Abused:   Marland Kitchen Physically Abused:   . Sexually Abused:    Health Maintenance  Topic Date Due  . Hepatitis C Screening  Never done  . HIV Screening  Never done  . TETANUS/TDAP  10/20/2014  . PAP SMEAR-Modifier  10/04/2016  . COLONOSCOPY  10/23/2017  . INFLUENZA VACCINE  06/07/2020  . MAMMOGRAM  10/21/2021  . COVID-19 Vaccine  Completed    The following portions of the  patient's history were reviewed and updated as appropriate:  She  has a past medical history of Back pain, BRCA negative, Cough, persistent, Elevated cholesterol, GERD (gastroesophageal reflux disease), Hyperlipidemia, Hypertension, Migraine, and Neck pain. She does not have any pertinent problems on file. She  has a past surgical history that includes Abdominal hysterectomy; Appendectomy; and Tonsillectomy and adenoidectomy (1966). Her family history includes Breast cancer in her maternal aunt, maternal aunt, maternal aunt, maternal aunt, and maternal grandmother; Breast cancer (age of onset: 3) in her mother; Cancer in her father; Depression in her mother; Diabetes in her mother; Hyperlipidemia in her mother; Hypertension in her father; Mental illness in her sister; Obesity in her mother; Stroke in her mother. She  reports that she has never smoked. She has never used smokeless tobacco. She reports current alcohol use. She reports that she does not use drugs. She has a current medication list which includes the following prescription(s): aspirin, atorvastatin, cholecalciferol, coq10, durezol, fluticasone, gatifloxacin, fish oil, prolensa, and vitamin b-6. Current Outpatient Medications on File Prior to Visit  Medication Sig Dispense Refill  . aspirin 81 MG tablet Take 81 mg by mouth daily.    Marland Kitchen atorvastatin (LIPITOR) 20 MG tablet Take 1 tablet (20 mg total) by mouth daily.  30 tablet 0  . cholecalciferol (VITAMIN D) 1000 UNITS tablet Take 1,000 Units by mouth daily.      . Coenzyme Q10 (COQ10) 400 MG CAPS Take 1 tablet by mouth daily. 30 capsule 0  . DUREZOL 0.05 % EMUL     . fluticasone (FLONASE) 50 MCG/ACT nasal spray Place 2 sprays into both nostrils daily. 16 g 5  . gatifloxacin (ZYMAXID) 0.5 % SOLN Place 1 drop into the right eye 4 (four) times daily.    . Omega-3 Fatty Acids (FISH OIL) 1000 MG CAPS Take by mouth daily.    Marland Kitchen PROLENSA 0.07 % SOLN SMARTSIG:1 Drop(s) Left Eye Every Evening    .  Pyridoxine HCl (VITAMIN B-6) 500 MG tablet Take 500 mg by mouth daily.     No current facility-administered medications on file prior to visit.   She has No Known Allergies..  Review of Systems Review of Systems  Constitutional: Negative for activity change, appetite change and fatigue.  HENT: Negative for hearing loss, congestion, tinnitus and ear discharge.  dentist q25mEyes: Negative for visual disturbance (see optho q1y -- vision corrected to 20/20 with glasses).  Respiratory: Negative for cough, chest tightness and shortness of breath.   Cardiovascular: Negative for chest pain, palpitations and leg swelling.  Gastrointestinal: Negative for abdominal pain, diarrhea, constipation and abdominal distention.  Genitourinary: Negative for urgency, frequency, decreased urine volume and difficulty urinating.  Musculoskeletal: Negative for back pain, arthralgias and gait problem.  Skin: Negative for color change, pallor and rash.  Neurological: Negative for dizziness, light-headedness, numbness and headaches.  Hematological: Negative for adenopathy. Does not bruise/bleed easily.  Psychiatric/Behavioral: Negative for suicidal ideas, confusion, sleep disturbance, self-injury, dysphoric mood, decreased concentration and agitation.       Objective:    BP 124/80 (BP Location: Right Arm, Patient Position: Sitting, Cuff Size: Normal)   Pulse 73   Temp 97.6 F (36.4 C) (Temporal)   Resp 18   Ht 5' 2"  (1.575 m)   Wt 129 lb 6.4 oz (58.7 kg)   SpO2 97%   BMI 23.67 kg/m  General appearance: alert, cooperative, appears stated age and no distress Head: Normocephalic, without obvious abnormality, atraumatic Eyes: conjunctivae/corneas clear. PERRL, EOM's intact. Fundi benign. Ears: normal TM's and external ear canals both ears Neck: no adenopathy, no carotid bruit, no JVD, supple, symmetrical, trachea midline and thyroid not enlarged, symmetric, no tenderness/mass/nodules Back: symmetric, no  curvature. ROM normal. No CVA tenderness. Lungs: clear to auscultation bilaterally Breasts: normal appearance, no masses or tenderness Heart: regular rate and rhythm, S1, S2 normal, no murmur, click, rub or gallop Abdomen: soft, non-tender; bowel sounds normal; no masses,  no organomegaly Pelvic: not indicated; status post hysterectomy, negative ROS Extremities: extremities normal, atraumatic, no cyanosis or edema Pulses: 2+ and symmetric Skin: Skin color, texture, turgor normal. No rashes or lesions Lymph nodes: Cervical, supraclavicular, and axillary nodes normal. Neurologic: Alert and oriented X 3, normal strength and tone. Normal symmetric reflexes. Normal coordination and gait    Assessment:    Healthy female exam.      Plan:     ghm utd Check lasbs See After Visit Summary for Counseling Recommendations    1. Hyperlipidemia, unspecified hyperlipidemia type Encouraged heart healthy diet, increase exercise, avoid trans fats, consider a krill oil cap daily - Lipid panel; Future - Comprehensive metabolic panel; Future  2. Dry skin dermatitis  - ammonium lactate (LAC-HYDRIN) 12 % cream; Apply topically as needed for dry skin.  Dispense: 385 g;  Refill: 0  3. Preventative health care See above

## 2020-04-03 NOTE — Patient Instructions (Signed)

## 2020-04-06 ENCOUNTER — Encounter: Payer: Self-pay | Admitting: Family Medicine

## 2020-06-16 ENCOUNTER — Other Ambulatory Visit: Payer: Self-pay | Admitting: Family Medicine

## 2020-06-16 DIAGNOSIS — I10 Essential (primary) hypertension: Secondary | ICD-10-CM

## 2020-06-16 DIAGNOSIS — E782 Mixed hyperlipidemia: Secondary | ICD-10-CM

## 2020-06-16 DIAGNOSIS — E785 Hyperlipidemia, unspecified: Secondary | ICD-10-CM

## 2020-07-09 ENCOUNTER — Ambulatory Visit: Payer: BC Managed Care – PPO | Admitting: Family Medicine

## 2020-09-03 ENCOUNTER — Ambulatory Visit: Payer: BC Managed Care – PPO

## 2020-09-16 ENCOUNTER — Other Ambulatory Visit: Payer: Self-pay

## 2020-09-16 ENCOUNTER — Ambulatory Visit (INDEPENDENT_AMBULATORY_CARE_PROVIDER_SITE_OTHER): Payer: BC Managed Care – PPO

## 2020-09-16 DIAGNOSIS — Z23 Encounter for immunization: Secondary | ICD-10-CM | POA: Diagnosis not present

## 2020-10-05 ENCOUNTER — Other Ambulatory Visit: Payer: Self-pay | Admitting: Family Medicine

## 2020-10-05 DIAGNOSIS — Z1231 Encounter for screening mammogram for malignant neoplasm of breast: Secondary | ICD-10-CM

## 2020-10-06 ENCOUNTER — Ambulatory Visit (INDEPENDENT_AMBULATORY_CARE_PROVIDER_SITE_OTHER): Payer: BC Managed Care – PPO | Admitting: Family Medicine

## 2020-10-06 ENCOUNTER — Other Ambulatory Visit: Payer: Self-pay

## 2020-10-06 ENCOUNTER — Encounter: Payer: Self-pay | Admitting: Family Medicine

## 2020-10-06 VITALS — BP 114/84 | HR 66 | Temp 98.4°F | Resp 18 | Ht 62.0 in | Wt 131.2 lb

## 2020-10-06 DIAGNOSIS — E2839 Other primary ovarian failure: Secondary | ICD-10-CM

## 2020-10-06 DIAGNOSIS — Z1159 Encounter for screening for other viral diseases: Secondary | ICD-10-CM | POA: Diagnosis not present

## 2020-10-06 DIAGNOSIS — E785 Hyperlipidemia, unspecified: Secondary | ICD-10-CM

## 2020-10-06 DIAGNOSIS — E782 Mixed hyperlipidemia: Secondary | ICD-10-CM | POA: Diagnosis not present

## 2020-10-06 DIAGNOSIS — Z1211 Encounter for screening for malignant neoplasm of colon: Secondary | ICD-10-CM

## 2020-10-06 DIAGNOSIS — I10 Essential (primary) hypertension: Secondary | ICD-10-CM | POA: Diagnosis not present

## 2020-10-06 LAB — COMPREHENSIVE METABOLIC PANEL
ALT: 50 U/L — ABNORMAL HIGH (ref 0–35)
AST: 33 U/L (ref 0–37)
Albumin: 4.4 g/dL (ref 3.5–5.2)
Alkaline Phosphatase: 65 U/L (ref 39–117)
BUN: 22 mg/dL (ref 6–23)
CO2: 30 mEq/L (ref 19–32)
Calcium: 9.9 mg/dL (ref 8.4–10.5)
Chloride: 106 mEq/L (ref 96–112)
Creatinine, Ser: 0.93 mg/dL (ref 0.40–1.20)
GFR: 65.91 mL/min (ref >60.00)
Glucose, Bld: 91 mg/dL (ref 70–99)
Potassium: 4.4 mEq/L (ref 3.5–5.1)
Sodium: 141 mEq/L (ref 135–145)
Total Bilirubin: 0.6 mg/dL (ref 0.2–1.2)
Total Protein: 6.6 g/dL (ref 6.0–8.3)

## 2020-10-06 LAB — LIPID PANEL
Cholesterol: 233 mg/dL — ABNORMAL HIGH (ref 0–200)
HDL: 93.2 mg/dL (ref >39.00)
LDL Cholesterol: 128 mg/dL — ABNORMAL HIGH (ref 0–99)
NonHDL: 139.92
Total CHOL/HDL Ratio: 3
Triglycerides: 59 mg/dL (ref 0.0–149.0)
VLDL: 11.8 mg/dL (ref 0.0–40.0)

## 2020-10-06 MED ORDER — ATORVASTATIN CALCIUM 20 MG PO TABS: 20.0000 mg | ORAL_TABLET | Freq: Every day | ORAL | 1 refills | Status: DC

## 2020-10-06 NOTE — Assessment & Plan Note (Addendum)
Encouraged heart healthy diet, increase exercise, avoid trans fats, consider a krill oil cap daily con't statin

## 2020-10-06 NOTE — Progress Notes (Signed)
Patient ID: Kristen Underwood, female    DOB: Aug 14, 1956  Age: 64 y.o. MRN: [id number]    Subjective:  Subjective  HPI Kristen Underwood presents for f/u cholesterol   Review of Systems  Constitutional: Negative for appetite change, diaphoresis, fatigue and unexpected weight change.  Eyes: Negative for pain, redness and visual disturbance.  Respiratory: Negative for cough, chest tightness, shortness of breath and wheezing.   Cardiovascular: Negative for chest pain, palpitations and leg swelling.  Endocrine: Negative for cold intolerance, heat intolerance, polydipsia, polyphagia and polyuria.  Genitourinary: Negative for difficulty urinating, dysuria and frequency.  Neurological: Negative for dizziness, light-headedness, numbness and headaches.    History Past Medical History:  Diagnosis Date  . Back pain   . BRCA negative   . Cough, persistent   . Elevated cholesterol   . GERD (gastroesophageal reflux disease)   . Hyperlipidemia   . Hypertension    10/09/13:  pt denies  . Migraine    is the reason she takes BP med  . Neck pain     She has a past surgical history that includes Abdominal hysterectomy; Appendectomy; Tonsillectomy and adenoidectomy (1966); and Cataract extraction (Left, 03/26/2020).   Her family history includes Breast cancer in her maternal aunt, maternal aunt, maternal aunt, maternal aunt, and maternal grandmother; Breast cancer (age of onset: 66) in her mother; Cancer in her father; Depression in her mother; Diabetes in her mother; Hyperlipidemia in her mother; Hypertension in her father; Mental illness in her sister; Obesity in her mother; Stroke in her mother.She reports that she has never smoked. She has never used smokeless tobacco. She reports current alcohol use. She reports that she does not use drugs.  Current Outpatient Medications on File Prior to Visit  Medication Sig Dispense Refill  . ammonium lactate (LAC-HYDRIN) 12 % cream Apply topically as needed for dry  skin. 385 g 0  . aspirin 81 MG tablet Take 81 mg by mouth daily.    . cholecalciferol (VITAMIN D) 1000 UNITS tablet Take 1,000 Units by mouth daily.      . Coenzyme Q10 (COQ10) 400 MG CAPS Take 1 tablet by mouth daily. 30 capsule 0  . DUREZOL 0.05 % EMUL     . fluticasone (FLONASE) 50 MCG/ACT nasal spray Place 2 sprays into both nostrils daily. 16 g 5  . gatifloxacin (ZYMAXID) 0.5 % SOLN Place 1 drop into the right eye 4 (four) times daily.    . Omega-3 Fatty Acids (FISH OIL) 1000 MG CAPS Take by mouth daily.    Marland Kitchen PROLENSA 0.07 % SOLN SMARTSIG:1 Drop(s) Left Eye Every Evening    . Pyridoxine HCl (VITAMIN B-6) 500 MG tablet Take 500 mg by mouth daily.     No current facility-administered medications on file prior to visit.     Objective:  Objective  Physical Exam Vitals and nursing note reviewed.  Constitutional:      Appearance: She is well-developed.  HENT:     Head: Normocephalic and atraumatic.  Eyes:     Conjunctiva/sclera: Conjunctivae normal.  Neck:     Thyroid: No thyromegaly.     Vascular: No carotid bruit or JVD.  Cardiovascular:     Rate and Rhythm: Normal rate and regular rhythm.     Heart sounds: Normal heart sounds. No murmur heard.   Pulmonary:     Effort: Pulmonary effort is normal. No respiratory distress.     Breath sounds: Normal breath sounds. No wheezing or rales.  Chest:  Chest wall: No tenderness.  Musculoskeletal:     Cervical back: Normal range of motion and neck supple.  Neurological:     Mental Status: She is alert and oriented to person, place, and time.    BP 114/84 (BP Location: Left Arm, Patient Position: Sitting, Cuff Size: Normal)   Pulse 66   Temp 98.4 F (36.9 C) (Oral)   Resp 18   Ht 5' 2"  (1.575 m)   Wt 131 lb 3.2 oz (59.5 kg)   SpO2 97%   BMI 24.00 kg/m  Wt Readings from Last 3 Encounters:  10/06/20 131 lb 3.2 oz (59.5 kg)  04/03/20 129 lb 6.4 oz (58.7 kg)  04/12/19 130 lb (59 kg)     Lab Results  Component Value Date    WBC 5.0 03/31/2020   HGB 15.2 (H) 03/31/2020   HCT 44.3 03/31/2020   PLT 278.0 03/31/2020   GLUCOSE 91 10/06/2020   CHOL 233 (H) 10/06/2020   TRIG 59.0 10/06/2020   HDL 93.20 10/06/2020   LDLDIRECT 151.8 07/22/2010   LDLCALC 128 (H) 10/06/2020   ALT 50 (H) 10/06/2020   AST 33 10/06/2020   NA 141 10/06/2020   K 4.4 10/06/2020   CL 106 10/06/2020   CREATININE 0.93 10/06/2020   BUN 22 10/06/2020   CO2 30 10/06/2020   TSH 2.01 03/31/2020   HGBA1C 5.5 10/09/2018    MM 3D SCREEN BREAST BILATERAL  Result Date: 10/23/2019 CLINICAL DATA:  Screening. EXAM: DIGITAL SCREENING BILATERAL MAMMOGRAM WITH TOMO AND CAD COMPARISON:  Previous exam(s). ACR Breast Density Category c: The breast tissue is heterogeneously dense, which may obscure small masses. FINDINGS: There are no findings suspicious for malignancy. Images were processed with CAD. IMPRESSION: No mammographic evidence of malignancy. A result letter of this screening mammogram will be mailed directly to the patient. RECOMMENDATION: Screening mammogram in one year. (Code:SM-B-01Y) BI-RADS CATEGORY  1: Negative. Electronically Signed   By: Lajean Manes M.D.   On: 10/23/2019 10:35     Assessment & Plan:  Plan  I have changed Kristen Underwood's atorvastatin. I am also having her maintain her cholecalciferol, vitamin B-6, fluticasone, aspirin, Fish Oil, CoQ10, Prolensa, Durezol, gatifloxacin, and ammonium lactate.  Meds ordered this encounter  Medications  . atorvastatin (LIPITOR) 20 MG tablet    Sig: Take 1 tablet (20 mg total) by mouth daily.    Dispense:  90 tablet    Refill:  1    Problem List Items Addressed This Visit      Unprioritized   Essential hypertension    Well controlled, no changes to meds. Encouraged heart healthy diet such as the DASH diet and exercise as tolerated.       Relevant Medications   atorvastatin (LIPITOR) 20 MG tablet   Hyperlipidemia    Encouraged heart healthy diet, increase exercise, avoid trans  fats, consider a krill oil cap daily con't statin      Relevant Medications   atorvastatin (LIPITOR) 20 MG tablet   Other Relevant Orders   Lipid panel (Completed)   Comprehensive metabolic panel (Completed)    Other Visit Diagnoses    Need for hepatitis C screening test    -  Primary   Relevant Orders   Hepatitis C antibody   Colon cancer screening       Relevant Orders   Ambulatory referral to Gastroenterology   Estrogen deficiency       Relevant Orders   DG Bone Density      Follow-up:  Return in about 6 months (around 04/05/2021), or if symptoms worsen or fail to improve, for annual exam, fasting.  Ann Held, DO

## 2020-10-06 NOTE — Assessment & Plan Note (Signed)
Well controlled, no changes to meds. Encouraged heart healthy diet such as the DASH diet and exercise as tolerated.  °

## 2020-10-06 NOTE — Patient Instructions (Signed)

## 2020-10-07 DIAGNOSIS — Z1231 Encounter for screening mammogram for malignant neoplasm of breast: Secondary | ICD-10-CM

## 2020-10-07 LAB — HEPATITIS C ANTIBODY
Hepatitis C Ab: NONREACTIVE
SIGNAL TO CUT-OFF: 0.01 (ref <1.00)

## 2020-10-10 ENCOUNTER — Encounter: Payer: Self-pay | Admitting: Family Medicine

## 2020-10-12 NOTE — Telephone Encounter (Signed)
That is great!  Keep up the great work!!  [employer] Christmas!!

## 2020-11-13 ENCOUNTER — Encounter: Payer: Self-pay | Admitting: Family Medicine

## 2020-11-13 ENCOUNTER — Ambulatory Visit: Payer: BC Managed Care – PPO

## 2020-11-13 NOTE — Telephone Encounter (Signed)
I had openings today----she could not get in ?

## 2020-11-16 ENCOUNTER — Ambulatory Visit: Payer: BC Managed Care – PPO | Admitting: Family Medicine

## 2020-11-24 ENCOUNTER — Ambulatory Visit (INDEPENDENT_AMBULATORY_CARE_PROVIDER_SITE_OTHER): Payer: BC Managed Care – PPO | Admitting: Family Medicine

## 2020-11-24 ENCOUNTER — Other Ambulatory Visit: Payer: Self-pay

## 2020-11-24 ENCOUNTER — Encounter: Payer: Self-pay | Admitting: Family Medicine

## 2020-11-24 VITALS — BP 120/78 | HR 70 | Temp 98.0°F | Resp 18 | Ht 62.0 in | Wt 132.0 lb

## 2020-11-24 DIAGNOSIS — R079 Chest pain, unspecified: Secondary | ICD-10-CM

## 2020-11-24 DIAGNOSIS — I10 Essential (primary) hypertension: Secondary | ICD-10-CM | POA: Diagnosis not present

## 2020-11-24 DIAGNOSIS — G43009 Migraine without aura, not intractable, without status migrainosus: Secondary | ICD-10-CM | POA: Diagnosis not present

## 2020-11-24 DIAGNOSIS — G43809 Other migraine, not intractable, without status migrainosus: Secondary | ICD-10-CM | POA: Diagnosis not present

## 2020-11-24 MED ORDER — IBUPROFEN 200 MG PO TABS
400.0000 mg | ORAL_TABLET | Freq: Once | ORAL | Status: AC
  Administered 2020-11-24: 400 mg via ORAL

## 2020-11-24 MED ORDER — AMLODIPINE BESYLATE 2.5 MG PO TABS: 2.5000 mg | ORAL_TABLET | Freq: Every day | ORAL | 3 refills | Status: DC

## 2020-11-24 NOTE — Patient Instructions (Signed)
General Headache Without Cause A headache is pain or discomfort felt around the head or neck area. The specific cause of a headache may not be found. There are many causes and types of headaches. A few common ones are:  Tension headaches.  Migraine headaches.  Cluster headaches.  Chronic daily headaches. Follow these instructions at home: Watch your condition for any changes. Let your health care provider know about them. Take these steps to help with your condition: Managing pain  Take over-the-counter and prescription medicines only as told by your health care provider.  Lie down in a dark, quiet room when you have a headache.  If directed, put ice on your head and neck area: ? Put ice in a plastic bag. ? Place a towel between your skin and the bag. ? Leave the ice on for 20 minutes, 2-3 times per day.  If directed, apply heat to the affected area. Use the heat source that your health care provider recommends, such as a moist heat pack or a heating pad. ? Place a towel between your skin and the heat source. ? Leave the heat on for 20-30 minutes. ? Remove the heat if your skin turns bright red. This is especially important if you are unable to feel pain, heat, or cold. You may have a greater risk of getting burned.  Keep lights dim if bright lights bother you or make your headaches worse.      Eating and drinking  Eat meals on a regular schedule.  If you drink alcohol: ? Limit how much you use to:  0-1 drink a day for women.  0-2 drinks a day for men. ? Be aware of how much alcohol is in your drink. In the [place] one drink equals one 12 oz bottle of beer (355 mL), one 5 oz glass of wine (148 mL), or one 1 oz glass of hard liquor (44 mL).  Stop drinking caffeine, or decrease the amount of caffeine you drink. General instructions  Keep a headache journal to help find out what may trigger your headaches. For example, write down: ? What you eat and drink. ? How much sleep  you get. ? Any change to your diet or medicines.  Try massage or other relaxation techniques.  Limit stress.  Sit up straight, and do not tense your muscles.  Do not use any products that contain nicotine or tobacco, such as cigarettes, e-cigarettes, and chewing tobacco. If you need help quitting, ask your health care provider.  Exercise regularly as told by your health care provider.  Sleep on a regular schedule. Get 7-9 hours of sleep each night, or the amount recommended by your health care provider.  Keep all follow-up visits as told by your health care provider. This is important.   Contact a health care provider if:  Your symptoms are not helped by medicine.  You have a headache that is different from the usual headache.  You have nausea or you vomit.  You have a fever. Get help right away if:  Your headache becomes severe quickly.  Your headache gets worse after moderate to intense physical activity.  You have repeated vomiting.  You have a stiff neck.  You have a loss of vision.  You have problems with speech.  You have pain in the eye or ear.  You have muscular weakness or loss of muscle control.  You lose your balance or have trouble walking.  You feel faint or pass out.  You have   confusion.  You have a seizure. Summary  A headache is pain or discomfort felt around the head or neck area.  There are many causes and types of headaches. In some cases, the cause may not be found.  Keep a headache journal to help find out what may trigger your headaches. Watch your condition for any changes. Let your health care provider know about them.  Contact a health care provider if you have a headache that is different from the usual headache, or if your symptoms are not helped by medicine.  Get help right away if your headache becomes severe, you vomit, you have a loss of vision, you lose your balance, or you have a seizure. This information is not intended to  replace advice given to you by your health care provider. Make sure you discuss any questions you have with your health care provider. Document Revised: 05/14/2018 Document Reviewed: 05/14/2018 Elsevier Patient Education  2021 [employer]  

## 2020-11-24 NOTE — Progress Notes (Signed)
Patient ID: Kristen Underwood, female    DOB: [birth year]  Age: 65 y.o. MRN: [id number]    Subjective:  Subjective  HPI Kristen Underwood presents for bp f/u --around christmas she had an episode of headache and chest pressure and her bp was 141/91  She had no dizziness, no sob and she has no headache now  She has had headaches 5 x a week and last most the day unless she takes tylenol / advil   Review of Systems  Constitutional: Negative for appetite change, diaphoresis, fatigue and unexpected weight change.  Eyes: Negative for pain, redness and visual disturbance.  Respiratory: Negative for cough, chest tightness, shortness of breath and wheezing.   Cardiovascular: Negative for chest pain, palpitations and leg swelling.  Endocrine: Negative for cold intolerance, heat intolerance, polydipsia, polyphagia and polyuria.  Genitourinary: Negative for difficulty urinating, dysuria and frequency.  Neurological: Positive for headaches. Negative for dizziness, light-headedness and numbness.    History Past Medical History:  Diagnosis Date  . Back pain   . BRCA negative   . Cough, persistent   . Elevated cholesterol   . GERD (gastroesophageal reflux disease)   . Hyperlipidemia   . Hypertension    10/09/13:  pt denies  . Migraine    is the reason she takes BP med  . Neck pain     She has a past surgical history that includes Abdominal hysterectomy; Appendectomy; Tonsillectomy and adenoidectomy (1966); and Cataract extraction (Left, 03/26/2020).   Her family history includes Breast cancer in her maternal aunt, maternal aunt, maternal aunt, maternal aunt, and maternal grandmother; Breast cancer (age of onset: 84) in her mother; Cancer in her father; Depression in her mother; Diabetes in her mother; Hyperlipidemia in her mother; Hypertension in her father; Mental illness in her sister; Obesity in her mother; Stroke in her mother.She reports that she has never smoked. She has never used smokeless tobacco.  She reports current alcohol use. She reports that she does not use drugs.  Current Outpatient Medications on File Prior to Visit  Medication Sig Dispense Refill  . ammonium lactate (LAC-HYDRIN) 12 % cream Apply topically as needed for dry skin. 385 g 0  . aspirin 81 MG tablet Take 81 mg by mouth daily.    Marland Kitchen atorvastatin (LIPITOR) 20 MG tablet Take 1 tablet (20 mg total) by mouth daily. 90 tablet 1  . cholecalciferol (VITAMIN D) 1000 UNITS tablet Take 1,000 Units by mouth daily.    . Coenzyme Q10 (COQ10) 400 MG CAPS Take 1 tablet by mouth daily. 30 capsule 0  . fluticasone (FLONASE) 50 MCG/ACT nasal spray Place 2 sprays into both nostrils daily. 16 g 5  . FOLIC ACID PO Take by mouth.    . Omega-3 Fatty Acids (FISH OIL) 1000 MG CAPS Take by mouth daily.    . Pyridoxine HCl (VITAMIN B-6) 500 MG tablet Take 500 mg by mouth daily.     No current facility-administered medications on file prior to visit.     Objective:  Objective  Physical Exam Vitals and nursing note reviewed.  Constitutional:      Appearance: She is well-developed and well-nourished.  HENT:     Head: Normocephalic and atraumatic.  Eyes:     Extraocular Movements: EOM normal.     Conjunctiva/sclera: Conjunctivae normal.  Neck:     Thyroid: No thyromegaly.     Vascular: No carotid bruit or JVD.  Cardiovascular:     Rate and Rhythm: Normal rate and regular rhythm.  Heart sounds: Normal heart sounds. No murmur heard.   Pulmonary:     Effort: Pulmonary effort is normal. No respiratory distress.     Breath sounds: Normal breath sounds. No wheezing or rales.  Chest:     Chest wall: No tenderness.  Musculoskeletal:        General: No edema.     Cervical back: Normal range of motion and neck supple.  Neurological:     Mental Status: She is alert and oriented to person, place, and time.  Psychiatric:        Mood and Affect: Mood and affect normal.    BP 120/78 (BP Location: Right Arm, Patient Position: Sitting,  Cuff Size: Normal)   Pulse 70   Temp 98 F (36.7 C) (Oral)   Resp 18   Ht 5' 2" (1.575 m)   Wt 132 lb (59.9 kg)   SpO2 98%   BMI 24.14 kg/m  Wt Readings from Last 3 Encounters:  11/24/20 132 lb (59.9 kg)  10/06/20 131 lb 3.2 oz (59.5 kg)  04/03/20 129 lb 6.4 oz (58.7 kg)  while ekg was being done pt began to have a headache and her bp was rechecked  150/98 ekg -- sinus brady   Lab Results  Component Value Date   WBC 5.0 03/31/2020   HGB 15.2 (H) 03/31/2020   HCT 44.3 03/31/2020   PLT 278.0 03/31/2020   GLUCOSE 91 10/06/2020   CHOL 233 (H) 10/06/2020   TRIG 59.0 10/06/2020   HDL 93.20 10/06/2020   LDLDIRECT 151.8 07/22/2010   LDLCALC 128 (H) 10/06/2020   ALT 50 (H) 10/06/2020   AST 33 10/06/2020   NA 141 10/06/2020   K 4.4 10/06/2020   CL 106 10/06/2020   CREATININE 0.93 10/06/2020   BUN 22 10/06/2020   CO2 30 10/06/2020   TSH 2.01 03/31/2020   HGBA1C 5.5 10/09/2018    MM 3D SCREEN BREAST BILATERAL  Result Date: 10/23/2019 CLINICAL DATA:  Screening. EXAM: DIGITAL SCREENING BILATERAL MAMMOGRAM WITH TOMO AND CAD COMPARISON:  Previous exam(s). ACR Breast Density Category c: The breast tissue is heterogeneously dense, which may obscure small masses. FINDINGS: There are no findings suspicious for malignancy. Images were processed with CAD. IMPRESSION: No mammographic evidence of malignancy. A result letter of this screening mammogram will be mailed directly to the patient. RECOMMENDATION: Screening mammogram in one year. (Code:SM-B-01Y) BI-RADS CATEGORY  1: Negative. Electronically Signed   By: Lajean Manes M.D.   On: 10/23/2019 10:35     Assessment & Plan:  Plan  I have discontinued Alishba Needles's Prolensa, Durezol, and gatifloxacin. I am also having her start on amLODipine. Additionally, I am having her maintain her cholecalciferol, vitamin B-6, fluticasone, aspirin, Fish Oil, CoQ10, ammonium lactate, atorvastatin, and FOLIC ACID PO. We administered ibuprofen.  Meds  ordered this encounter  Medications  . amLODipine (NORVASC) 2.5 MG tablet    Sig: Take 1 tablet (2.5 mg total) by mouth daily.    Dispense:  30 tablet    Refill:  3  . ibuprofen (ADVIL) tablet 400 mg    Problem List Items Addressed This Visit      Unprioritized   HTN (hypertension) - Primary    norvasc 2.5 mg daily -- to help with HA as well  F/u 2-3 weeks       Relevant Medications   amLODipine (NORVASC) 2.5 MG tablet   Other Relevant Orders   EKG 12-Lead (Completed)   Migraine headache    norvasc  2.5 mg daily  2-3 weeks       Relevant Medications   amLODipine (NORVASC) 2.5 MG tablet    Other Visit Diagnoses    Chest pain, unspecified type       Relevant Orders   EKG 12-Lead (Completed)      Follow-up: Return in about 3 weeks (around 12/15/2020), or if symptoms worsen or fail to improve, for hypertension.  Yvonne R Lowne Chase, DO     

## 2020-11-25 NOTE — Assessment & Plan Note (Signed)
norvasc 2.5 mg daily  2-3 weeks

## 2020-11-25 NOTE — Assessment & Plan Note (Signed)
norvasc 2.5 mg daily -- to help with HA as well  F/u 2-3 weeks

## 2020-12-09 ENCOUNTER — Emergency Department (HOSPITAL_BASED_OUTPATIENT_CLINIC_OR_DEPARTMENT_OTHER)
Admission: EM | Admit: 2020-12-09 | Discharge: 2020-12-09 | Disposition: A | Discharge status: Home / Self Care | Payer: BC Managed Care – PPO | Attending: Emergency Medicine | Admitting: Emergency Medicine

## 2020-12-09 ENCOUNTER — Emergency Department (HOSPITAL_BASED_OUTPATIENT_CLINIC_OR_DEPARTMENT_OTHER): Payer: BC Managed Care – PPO

## 2020-12-09 ENCOUNTER — Encounter (HOSPITAL_BASED_OUTPATIENT_CLINIC_OR_DEPARTMENT_OTHER): Payer: Self-pay

## 2020-12-09 ENCOUNTER — Telehealth: Payer: Self-pay

## 2020-12-09 ENCOUNTER — Encounter: Payer: Self-pay | Admitting: Family Medicine

## 2020-12-09 ENCOUNTER — Other Ambulatory Visit: Payer: Self-pay

## 2020-12-09 DIAGNOSIS — R079 Chest pain, unspecified: Secondary | ICD-10-CM | POA: Diagnosis not present

## 2020-12-09 DIAGNOSIS — I129 Hypertensive chronic kidney disease with stage 1 through stage 4 chronic kidney disease, or unspecified chronic kidney disease: Secondary | ICD-10-CM | POA: Diagnosis not present

## 2020-12-09 DIAGNOSIS — N182 Chronic kidney disease, stage 2 (mild): Secondary | ICD-10-CM | POA: Diagnosis not present

## 2020-12-09 LAB — CBC
HCT: 44.6 % (ref 36.0–46.0)
Hemoglobin: 15.2 g/dL — ABNORMAL HIGH (ref 12.0–15.0)
MCH: 31.1 pg (ref 26.0–34.0)
MCHC: 34.1 g/dL (ref 30.0–36.0)
MCV: 91.2 fL (ref 80.0–100.0)
Platelets: 265 10*3/uL (ref 150–400)
RBC: 4.89 MIL/uL (ref 3.87–5.11)
RDW: 12.4 % (ref 11.5–15.5)
WBC: 4.8 10*3/uL (ref 4.0–10.5)
nRBC: 0 % (ref 0.0–0.2)

## 2020-12-09 LAB — BASIC METABOLIC PANEL
Anion gap: 9 (ref 5–15)
BUN: 22 mg/dL (ref 8–23)
CO2: 25 mmol/L (ref 22–32)
Calcium: 9.9 mg/dL (ref 8.9–10.3)
Chloride: 104 mmol/L (ref 98–111)
Creatinine, Ser: 0.81 mg/dL (ref 0.44–1.00)
GFR, Estimated: >60 mL/min (ref >60)
Glucose, Bld: 93 mg/dL (ref 70–99)
Potassium: 4.1 mmol/L (ref 3.5–5.1)
Sodium: 138 mmol/L (ref 135–145)

## 2020-12-09 LAB — TROPONIN I (HIGH SENSITIVITY)
Troponin I (High Sensitivity): 3 ng/L (ref <18)
Troponin I (High Sensitivity): 3 ng/L (ref <18)

## 2020-12-09 NOTE — ED Triage Notes (Addendum)
Pt c/o changes in BP-was seen by PCP last week started on BP med-pt c/o CP episode yesterday and CP x 1 hour-pt denies fever/flu sxNAD-steady gait

## 2020-12-09 NOTE — ED Provider Notes (Signed)
[facility] Provider Note   CSN: [id number] Arrival date & time: 12/09/20  1209     History Chief Complaint  Patient presents with  . Chest Pain    Kristen Underwood is a 65 y.o. female.  HPI Patient presents with chest pain.  Began this morning around 1030.  Dull in the mid chest.  States it feels like the same pain she has in her head when her blood pressure goes up.  States that happened previously.  States she was recently started on Norvasc 2.5 mg by her PCP.  States she checked her blood blood pressure today and it was elevated.  States it was 150/90.Pain-free now.  States she is worried about her blood pressure elevated.    Past Medical History:  Diagnosis Date  . Back pain   . BRCA negative   . Cough, persistent   . Elevated cholesterol   . GERD (gastroesophageal reflux disease)   . Hyperlipidemia   . Hypertension    10/09/13:  pt denies  . Migraine    is the reason she takes BP med  . Neck pain     Patient Active Problem List   Diagnosis Date Noted  . Dehydration 12/18/2017  . Stage 2 chronic kidney disease 12/04/2017  . Prediabetes 12/04/2017  . Shortness of breath on exertion 11/20/2017  . Other fatigue 11/20/2017  . Vitamin D deficiency 11/20/2017  . Hyperglycemia 11/20/2017  . History of pneumonia 08/15/2017  . Atypical chest pain 06/22/2017  . HTN (hypertension) 09/13/2015  . Essential and other specified forms of tremor 10/09/2013  . Intention tremor 10/04/2013  . Cough, persistent 10/04/2013  . SINUSITIS - ACUTE-NOS 08/02/2010  . Essential hypertension 07/22/2010  . HEADACHE 07/22/2010  . Hyperlipidemia 08/13/2008  . ANXIETY STATE, UNSPECIFIED 08/13/2008  . Migraine headache 08/13/2008  . BACK PAIN 08/13/2008  . FATIGUE 08/13/2008  . ELEVATED BP READING WITHOUT DX HYPERTENSION 08/13/2008    Past Surgical History:  Procedure Laterality Date  . ABDOMINAL HYSTERECTOMY    . APPENDECTOMY    . CATARACT EXTRACTION Left  03/26/2020   Dr Nancy Fetter  . TONSILLECTOMY AND ADENOIDECTOMY  1966     OB History    Gravida  3   Para  3   Term  3   Preterm      AB      Living        SAB      IAB      Ectopic      Multiple      Live Births              Family History  Problem Relation Age of Onset  . Diabetes Mother   . Hyperlipidemia Mother   . Stroke Mother   . Depression Mother   . Obesity Mother   . Breast cancer Mother 46  . Cancer Father        PROSTATE  . Hypertension Father   . Mental illness Sister        had urosepsis--  and had schizo breakdown  . Breast cancer Maternal Aunt        in 79's  . Breast cancer Maternal Grandmother        in 44's  . Breast cancer Maternal Aunt        in 74's  . Breast cancer Maternal Aunt        in 51's  . Breast cancer Maternal Aunt  in 2's    Social History   Tobacco Use  . Smoking status: Never Smoker  . Smokeless tobacco: Never Used  Substance Use Topics  . Alcohol use: Yes    Comment: RARE,SOCIAL  . Drug use: No    Home Medications Prior to Admission medications   Medication Sig Start Date End Date Taking? Authorizing Provider  ammonium lactate (LAC-HYDRIN) 12 % cream Apply topically as needed for dry skin. 04/03/20   Ann Held, DO  aspirin 81 MG tablet Take 81 mg by mouth daily.    [provider]  atorvastatin (LIPITOR) 20 MG tablet Take 1 tablet (20 mg total) by mouth daily. 10/06/20   Ann Held, DO  cholecalciferol (VITAMIN D) 1000 UNITS tablet Take 1,000 Units by mouth daily.    [provider]  Coenzyme Q10 (COQ10) 400 MG CAPS Take 1 tablet by mouth daily. 07/26/18   Dennard Nip D, MD  fluticasone (FLONASE) 50 MCG/ACT nasal spray Place 2 sprays into both nostrils daily. 02/12/15   Roma Schanz R, DO  FOLIC ACID PO Take by mouth.    [provider]  Pyridoxine HCl (VITAMIN B-6) 500 MG tablet Take 500 mg by mouth daily.    [provider]     Allergies    Patient has no known allergies.  Review of Systems   Review of Systems  Constitutional: Negative for appetite change and fever.  HENT: Negative for congestion.   Respiratory: Negative for stridor.   Cardiovascular: Positive for chest pain.  Gastrointestinal: Negative for abdominal pain.  Genitourinary: Negative for flank pain.  Musculoskeletal: Negative for back pain.  Skin: Negative for rash.  Neurological: Negative for weakness.  Psychiatric/Behavioral: Negative for confusion.    Physical Exam Updated Vital Signs BP 123/79   Pulse (!) 55   Temp 98.6 F (37 C) (Oral)   Resp 17   Ht 5' 2"  (1.575 m)   Wt 59.9 kg   SpO2 95%   BMI 24.14 kg/m   Physical Exam Vitals and nursing note reviewed.  HENT:     Head: Atraumatic.  Cardiovascular:     Rate and Rhythm: Normal rate and regular rhythm.  Pulmonary:     Effort: Pulmonary effort is normal.     Breath sounds: No wheezing, rhonchi or rales.  Chest:     Chest wall: No tenderness.  Abdominal:     Palpations: Abdomen is soft.  Musculoskeletal:     Right lower leg: No edema.     Left lower leg: No edema.  Skin:    General: Skin is warm.     Capillary Refill: Capillary refill takes less than 2 seconds.  Neurological:     Mental Status: She is alert and oriented to person, place, and time.     ED Results / Procedures / Treatments   Labs (all labs ordered are listed, but only abnormal results are displayed) Labs Reviewed  CBC - Abnormal; Notable for the following components:      Result Value   Hemoglobin 15.2 (*)    All other components within normal limits  BASIC METABOLIC PANEL  TROPONIN I (HIGH SENSITIVITY)  TROPONIN I (HIGH SENSITIVITY)    EKG EKG Interpretation  Date/Time:  Wednesday December 09 2020 12:21:06 EST Ventricular Rate:  66 PR Interval:  168 QRS Duration: 80 QT Interval:  396 QTC Calculation: 415 R Axis:   77 Text Interpretation: Sinus rhythm with Premature atrial  complexes Otherwise normal ECG  No old tracing to compare Confirmed by Deno Etienne [id number]) on 12/09/2020 2:38:58 PM Also confirmed by Davonna Belling [phone])  on 12/09/2020 3:30:40 PM   Radiology DG Chest 2 View  Result Date: 12/09/2020 CLINICAL DATA:  Chest pressure and chest pain today. EXAM: CHEST - 2 VIEW COMPARISON:  Chest radiograph June 22, 2017 FINDINGS: The heart size and mediastinal contours are within normal limits. No focal consolidation. No pleural effusion. No pneumothorax. The visualized skeletal structures are unremarkable. IMPRESSION: No acute cardiopulmonary finding. Electronically Signed   By: Dahlia Bailiff MD   On: 12/09/2020 13:43    Procedures Procedures   Medications Ordered in ED Medications - No data to display  ED Course  I have reviewed the triage vital signs and the nursing notes.  Pertinent labs & imaging results that were available during my care of the patient were reviewed by me and considered in my medical decision making (see chart for details).    MDM Rules/Calculators/A&P                          Patient presents with mid chest pain.  Dull.  Lasted about an hour or 2.  Is not had episodes like this before.  Blood pressure was mildly elevated.  It is improved.  No known cardiac history besides some hypertension.  Doubt this is cardiac ischemia pulmonary embolism aortic dissection.  Benign abdominal exam.  Troponin negative x2.  I think patient is low enough cardiac risk for discharge home.  Follow-up with PCP as needed  Final Clinical Impression(s) / ED Diagnoses Final diagnoses:  Nonspecific chest pain    Rx / DC Orders ED Discharge Orders    None       Davonna Belling, MD 12/09/20 1535

## 2020-12-09 NOTE — Telephone Encounter (Signed)
Nurse Assessment Nurse: Harvie Bridge, RN, Beth Date/Time (Eastern Time): 12/09/2020 11:53:12 AM Confirm and document reason for call. If symptomatic, describe symptoms. ---Caller states she has a patient who has a bp of 147/94 with tightness in her chest. SAw PCP 2 weeks ago, ekg, and BP meds given Does the patient have any new or worsening symptoms? ---Yes Will a triage be completed? ---Yes Related visit to physician within the last 2 weeks? ---Yes Does the PT have any chronic conditions? (i.e. diabetes, asthma, this includes High risk factors for pregnancy, etc.) ---Yes List chronic conditions. ---HTN Is this a behavioral health or substance abuse call? ---No Guidelines Guideline Title Affirmed Question Affirmed Notes Nurse Date/Time (Eastern Time) Chest Pain [1] Chest pain lasts > 5 minutes AND [2] described as crushing, pressure-like, or heavy Newhart, RN, Beth 12/09/2020 11:54:16 AM Disp. Time Eilene Ghazi Time) Disposition Final User 12/09/2020 11:52:16 AM Send to Urgent Queue Silvestre Moment 12/09/2020 11:56:27 AM 911 Outcome Documentation Newhart, RN, Beth Reason: Family driving to ED 04/09/1307 11:55:03 AM Call EMS 911 Now Yes Newhart, RN, Beth PLEASE NOTE: All timestamps contained within this report are represented as [place] Standard Time. CONFIDENTIALTY NOTICE: This fax transmission is intended only for the addressee. It contains information that is legally privileged, confidential or otherwise protected from use or disclosure. If you are not the intended recipient, you are strictly prohibited from reviewing, disclosing, copying using or disseminating any of this information or taking any action in reliance on or regarding this information. If you have received this fax in error, please notify us immediately by telephone so that we can arrange for its return to [name]. Phone: [phone], Toll-Free: [phone], Fax: [phone] Page: 2 of 2 Call Id: [id number] Caller Disagree/Comply  Disagree Caller Understands Yes PreDisposition InappropriateToAsk Care Advice Given Per Guideline CALL EMS 911 NOW: * Immediate medical attention is needed. You need to hang up and call 911 (or an ambulance). CARE ADVICE given per Chest Pain (Adult) guideline. Comments User: Louretta Shorten, RN Date/Time Eilene Ghazi Time): 12/09/2020 11:56:09 AM Patient refused EMS , husband will drive her to ED Referrals [place] REFUSED  Pt currently in ED.

## 2020-12-11 ENCOUNTER — Other Ambulatory Visit: Payer: Self-pay

## 2020-12-11 ENCOUNTER — Ambulatory Visit
Admission: RE | Admit: 2020-12-11 | Discharge: 2020-12-11 | Disposition: A | Discharge status: Home / Self Care | Payer: BC Managed Care – PPO | Source: Ambulatory Visit | Attending: Family Medicine | Admitting: Family Medicine

## 2020-12-11 DIAGNOSIS — Z1231 Encounter for screening mammogram for malignant neoplasm of breast: Secondary | ICD-10-CM

## 2020-12-17 ENCOUNTER — Encounter: Payer: Self-pay | Admitting: Family Medicine

## 2020-12-17 ENCOUNTER — Ambulatory Visit (INDEPENDENT_AMBULATORY_CARE_PROVIDER_SITE_OTHER): Payer: BC Managed Care – PPO | Admitting: Family Medicine

## 2020-12-17 ENCOUNTER — Other Ambulatory Visit: Payer: Self-pay

## 2020-12-17 VITALS — BP 110/84 | HR 84 | Temp 98.0°F | Resp 18 | Ht 62.0 in | Wt 130.6 lb

## 2020-12-17 DIAGNOSIS — R3129 Other microscopic hematuria: Secondary | ICD-10-CM

## 2020-12-17 DIAGNOSIS — R102 Pelvic and perineal pain: Secondary | ICD-10-CM | POA: Diagnosis not present

## 2020-12-17 DIAGNOSIS — R079 Chest pain, unspecified: Secondary | ICD-10-CM

## 2020-12-17 DIAGNOSIS — I10 Essential (primary) hypertension: Secondary | ICD-10-CM

## 2020-12-17 LAB — POC URINALSYSI DIPSTICK (AUTOMATED)
Bilirubin, UA: NEGATIVE
Blood, UA: NEGATIVE
Glucose, UA: NEGATIVE
Ketones, UA: NEGATIVE
Leukocytes, UA: NEGATIVE
Nitrite, UA: NEGATIVE
Protein, UA: NEGATIVE
Spec Grav, UA: 1.02 (ref 1.010–1.025)
Urobilinogen, UA: 0.2 E.U./dL
pH, UA: 5.5 (ref 5.0–8.0)

## 2020-12-17 NOTE — Progress Notes (Signed)
Patient ID: Kristen Underwood, female    DOB: 1956/09/20  Age: 65 y.o. MRN: [id number]    Subjective:  Subjective  HPI Madison Direnzo presents for f/u from er for chest pain ---- w/u neg in er and pain went away     She also c/o suprapubic pressure that feels like uti No other complaints No more cp since er   Review of Systems  Constitutional: Negative for appetite change, diaphoresis, fatigue and unexpected weight change.  Eyes: Negative for pain, redness and visual disturbance.  Respiratory: Negative for cough, chest tightness, shortness of breath and wheezing.   Cardiovascular: Negative for chest pain, palpitations and leg swelling.  Endocrine: Negative for cold intolerance, heat intolerance, polydipsia, polyphagia and polyuria.  Genitourinary: Positive for pelvic pain. Negative for difficulty urinating, dysuria and frequency.  Neurological: Negative for dizziness, light-headedness, numbness and headaches.    History Past Medical History:  Diagnosis Date  . Back pain   . BRCA negative   . Cough, persistent   . Elevated cholesterol   . GERD (gastroesophageal reflux disease)   . Hyperlipidemia   . Hypertension    10/09/13:  pt denies  . Migraine    is the reason she takes BP med  . Neck pain     She has a past surgical history that includes Abdominal hysterectomy; Appendectomy; Tonsillectomy and adenoidectomy (1966); and Cataract extraction (Left, 03/26/2020).   Her family history includes Breast cancer in her maternal aunt, maternal aunt, maternal aunt, maternal aunt, and maternal grandmother; Breast cancer (age of onset: 31) in her mother; Cancer in her father; Depression in her mother; Diabetes in her mother; Hyperlipidemia in her mother; Hypertension in her father; Mental illness in her sister; Obesity in her mother; Stroke in her mother.She reports that she has never smoked. She has never used smokeless tobacco. She reports current alcohol use. She reports that she does not use  drugs.  Current Outpatient Medications on File Prior to Visit  Medication Sig Dispense Refill  . ammonium lactate (LAC-HYDRIN) 12 % cream Apply topically as needed for dry skin. 385 g 0  . aspirin 81 MG tablet Take 81 mg by mouth daily.    Marland Kitchen atorvastatin (LIPITOR) 20 MG tablet Take 1 tablet (20 mg total) by mouth daily. 90 tablet 1  . cholecalciferol (VITAMIN D) 1000 UNITS tablet Take 1,000 Units by mouth daily.    . Coenzyme Q10 (COQ10) 400 MG CAPS Take 1 tablet by mouth daily. 30 capsule 0  . fluticasone (FLONASE) 50 MCG/ACT nasal spray Place 2 sprays into both nostrils daily. 16 g 5  . FOLIC ACID PO Take by mouth.    . Pyridoxine HCl (VITAMIN B-6) 500 MG tablet Take 500 mg by mouth daily.     No current facility-administered medications on file prior to visit.     Objective:  Objective  Physical Exam Vitals and nursing note reviewed.  Constitutional:      Appearance: She is well-developed and well-nourished.  HENT:     Head: Normocephalic and atraumatic.  Eyes:     Extraocular Movements: EOM normal.     Conjunctiva/sclera: Conjunctivae normal.  Neck:     Thyroid: No thyromegaly.     Vascular: No carotid bruit or JVD.  Cardiovascular:     Rate and Rhythm: Normal rate and regular rhythm.     Heart sounds: Normal heart sounds. No murmur heard.   Pulmonary:     Effort: Pulmonary effort is normal. No respiratory distress.  Breath sounds: Normal breath sounds. No wheezing or rales.  Chest:     Chest wall: No tenderness.  Abdominal:     Tenderness: There is abdominal tenderness in the suprapubic area. There is no right CVA tenderness, left CVA tenderness, guarding or rebound.  Musculoskeletal:        General: No edema.     Cervical back: Normal range of motion and neck supple.  Neurological:     Mental Status: She is alert and oriented to person, place, and time.  Psychiatric:        Mood and Affect: Mood and affect normal.    BP 110/84 (BP Location: Left Arm,  Patient Position: Sitting, Cuff Size: Normal)   Pulse 84   Temp 98 F (36.7 C) (Oral)   Resp 18   Ht _0  (1.575 m)   Wt 130 lb 9.6 oz (59.2 kg)   SpO2 98%   BMI 23.89 kg/m  Wt Readings from Last 3 Encounters:  12/17/20 130 lb 9.6 oz (59.2 kg)  12/09/20 132 lb (59.9 kg)  11/24/20 132 lb (59.9 kg)     Lab Results  Component Value Date   WBC 4.8 12/09/2020   HGB 15.2 (H) 12/09/2020   HCT 44.6 12/09/2020   PLT 265 12/09/2020   GLUCOSE 93 12/09/2020   CHOL 233 (H) 10/06/2020   TRIG 59.0 10/06/2020   HDL 93.20 10/06/2020   LDLDIRECT 151.8 07/22/2010   LDLCALC 128 (H) 10/06/2020   ALT 50 (H) 10/06/2020   AST 33 10/06/2020   NA 138 12/09/2020   K 4.1 12/09/2020   CL 104 12/09/2020   CREATININE 0.81 12/09/2020   BUN 22 12/09/2020   CO2 25 12/09/2020   TSH 2.01 03/31/2020   HGBA1C 5.5 10/09/2018    MM 3D SCREEN BREAST BILATERAL  Result Date: 12/11/2020 CLINICAL DATA:  Screening. EXAM: DIGITAL SCREENING BILATERAL MAMMOGRAM WITH TOMOSYNTHESIS AND CAD COMPARISON:  Previous exam(s). ACR Breast Density Category b: There are scattered areas of fibroglandular density. FINDINGS: There are no findings suspicious for malignancy. The images were evaluated with computer-aided detection. IMPRESSION: No mammographic evidence of malignancy. A result letter of this screening mammogram will be mailed directly to the patient. RECOMMENDATION: Screening mammogram in one year. (Code:SM-B-01Y) BI-RADS CATEGORY  1: Negative. Electronically Signed   By: Lillia Mountain M.D.   On: 12/11/2020 10:49     Assessment & Plan:  Plan  I am having Kristen Underwood maintain her cholecalciferol, vitamin B-6, fluticasone, aspirin, CoQ10, ammonium lactate, atorvastatin, and FOLIC ACID PO.  No orders of the defined types were placed in this encounter.   Problem List Items Addressed This Visit      Unprioritized   Chest pain    Resolved ? gerd Check echo Consider stress test       Relevant Orders    ECHOCARDIOGRAM COMPLETE   Essential hypertension    Well controlled, no changes to meds. Encouraged heart healthy diet such as the DASH diet and exercise as tolerated.        Suprapubic pain - Primary    ua neg Culture pending  If symptoms call office Pelvic US if no improvement       Relevant Orders   POCT Urinalysis Dipstick (Automated) (Completed)    Other Visit Diagnoses    Pelvic pain       Relevant Orders   US Pelvic Complete With Transvaginal   Urine Culture   Other microscopic hematuria       Relevant  Orders   Urine Culture      Follow-up: No follow-ups on file.  Ann Held, DO

## 2020-12-17 NOTE — Patient Instructions (Signed)

## 2020-12-17 NOTE — Assessment & Plan Note (Signed)
Well controlled, no changes to meds. Encouraged heart healthy diet such as the DASH diet and exercise as tolerated.  °

## 2020-12-17 NOTE — Assessment & Plan Note (Signed)
Resolved ? gerd Check echo Consider stress test

## 2020-12-17 NOTE — Assessment & Plan Note (Signed)
ua neg Culture pending  If symptoms call office Pelvic US if no improvement

## 2020-12-18 LAB — URINE CULTURE
MICRO NUMBER:: 11519507
SPECIMEN QUALITY:: ADEQUATE

## 2020-12-22 NOTE — Addendum Note (Signed)
Addended by: Sanda Linger on: 12/22/2020 03:36 PM   Modules accepted: Orders

## 2020-12-30 ENCOUNTER — Other Ambulatory Visit: Payer: Self-pay

## 2020-12-30 ENCOUNTER — Ambulatory Visit (HOSPITAL_BASED_OUTPATIENT_CLINIC_OR_DEPARTMENT_OTHER)
Admission: RE | Admit: 2020-12-30 | Discharge: 2020-12-30 | Disposition: A | Discharge status: Home / Self Care | Payer: BC Managed Care – PPO | Source: Ambulatory Visit | Attending: Family Medicine | Admitting: Family Medicine

## 2020-12-30 DIAGNOSIS — R102 Pelvic and perineal pain: Secondary | ICD-10-CM | POA: Insufficient documentation

## 2021-01-05 ENCOUNTER — Other Ambulatory Visit: Payer: Self-pay | Admitting: Family Medicine

## 2021-01-05 DIAGNOSIS — R102 Pelvic and perineal pain: Secondary | ICD-10-CM

## 2021-01-06 ENCOUNTER — Encounter: Payer: Self-pay | Admitting: Family Medicine

## 2021-01-06 ENCOUNTER — Telehealth: Payer: Self-pay | Admitting: Family Medicine

## 2021-01-06 NOTE — Telephone Encounter (Signed)
   Called patient to get lab appt scheduled that [place] wanted to do. Could not edit the [place] message.   Had to leave VM for pt to call back

## 2021-01-06 NOTE — Telephone Encounter (Signed)
See My chart message

## 2021-01-06 NOTE — Addendum Note (Signed)
Addended by: Sanda Linger on: 01/06/2021 03:13 PM   Modules accepted: Orders

## 2021-01-07 ENCOUNTER — Other Ambulatory Visit (INDEPENDENT_AMBULATORY_CARE_PROVIDER_SITE_OTHER): Payer: BC Managed Care – PPO

## 2021-01-07 ENCOUNTER — Other Ambulatory Visit: Payer: Self-pay

## 2021-01-07 DIAGNOSIS — R102 Pelvic and perineal pain: Secondary | ICD-10-CM | POA: Diagnosis not present

## 2021-01-07 NOTE — Telephone Encounter (Signed)
I thought she wanted to get the ca125---  that is why I ordered it .

## 2021-01-07 NOTE — Telephone Encounter (Signed)
Please advise 

## 2021-01-08 LAB — CA 125: CA 125: 9 U/mL (ref <35)

## 2021-01-18 ENCOUNTER — Ambulatory Visit (HOSPITAL_BASED_OUTPATIENT_CLINIC_OR_DEPARTMENT_OTHER): Payer: BC Managed Care – PPO

## 2021-02-12 ENCOUNTER — Other Ambulatory Visit: Payer: BC Managed Care – PPO

## 2021-02-22 ENCOUNTER — Other Ambulatory Visit: Payer: Self-pay

## 2021-02-22 ENCOUNTER — Ambulatory Visit (AMBULATORY_SURGERY_CENTER): Payer: BC Managed Care – PPO | Admitting: *Deleted

## 2021-02-22 VITALS — Ht 60.75 in | Wt 129.0 lb

## 2021-02-22 DIAGNOSIS — Z1211 Encounter for screening for malignant neoplasm of colon: Secondary | ICD-10-CM

## 2021-02-22 MED ORDER — PEG 3350-KCL-NA BICARB-NACL 420 G PO SOLR: 4000.0000 mL | Freq: Once | ORAL | 0 refills | Status: AC

## 2021-02-22 NOTE — Progress Notes (Signed)
Patient's pre-visit was done today over the phone with the patient due to COVID-19 pandemic. Name,DOB and address verified. Insurance verified. Patient denies any allergies to Eggs and Soy. Patient denies any problems with anesthesia/sedation. Bladder does not work post-op per pt. Patient denies taking diet pills or blood thinners. Packet of Prep instructions mailed to patient including a copy of a consent form-pt is aware.  Patient understands to call us back with any questions or concerns. The patient is COVID-19 fully vaccinated, per patient. Patient is aware of our care-partner policy and YIRSW-54 safety protocol. EMMI education assigned to the patient for the procedure, sent to [facility]. Patient is having lower abd. Pain and back pain, pt is taking Prilosec that helps with pain. Patient is having constipation. BM every day but sometimes small amts.

## 2021-02-25 ENCOUNTER — Other Ambulatory Visit: Payer: Self-pay | Admitting: Family Medicine

## 2021-02-25 DIAGNOSIS — I10 Essential (primary) hypertension: Secondary | ICD-10-CM

## 2021-02-25 DIAGNOSIS — G43809 Other migraine, not intractable, without status migrainosus: Secondary | ICD-10-CM

## 2021-03-05 ENCOUNTER — Encounter: Payer: Self-pay | Admitting: Gastroenterology

## 2021-03-05 ENCOUNTER — Other Ambulatory Visit: Payer: Self-pay

## 2021-03-05 ENCOUNTER — Ambulatory Visit (AMBULATORY_SURGERY_CENTER): Payer: BC Managed Care – PPO | Admitting: Gastroenterology

## 2021-03-05 VITALS — BP 107/71 | HR 72 | Temp 97.3°F | Resp 14 | Ht 60.75 in | Wt 129.0 lb

## 2021-03-05 DIAGNOSIS — D122 Benign neoplasm of ascending colon: Secondary | ICD-10-CM

## 2021-03-05 DIAGNOSIS — Z1211 Encounter for screening for malignant neoplasm of colon: Secondary | ICD-10-CM

## 2021-03-05 MED ORDER — DICYCLOMINE HCL 10 MG PO CAPS: 10.0000 mg | ORAL_CAPSULE | Freq: Three times a day (TID) | ORAL | 0 refills | Status: DC

## 2021-03-05 MED ORDER — SODIUM CHLORIDE 0.9 % IV SOLN: 500.0000 mL | Freq: Once | INTRAVENOUS | Status: DC

## 2021-03-05 NOTE — Patient Instructions (Signed)
YOU HAD AN ENDOSCOPIC PROCEDURE TODAY AT THE [facility]:   Refer to the procedure report that was given to you for any specific questions about what was found during the examination.  If the procedure report does not answer your questions, please call your gastroenterologist to clarify.  If you requested that your care partner not be given the details of your procedure findings, then the procedure report has been included in a sealed envelope for you to review at your convenience later.  YOU SHOULD EXPECT: Some feelings of bloating in the abdomen. Passage of more gas than usual.  Walking can help get rid of the air that was put into your GI tract during the procedure and reduce the bloating. If you had a lower endoscopy (such as a colonoscopy or flexible sigmoidoscopy) you may notice spotting of blood in your stool or on the toilet paper. If you underwent a bowel prep for your procedure, you may not have a normal bowel movement for a few days.  Please Note:  You might notice some irritation and congestion in your nose or some drainage.  This is from the oxygen used during your procedure.  There is no need for concern and it should clear up in a day or so.  SYMPTOMS TO REPORT IMMEDIATELY:   Following lower endoscopy (colonoscopy or flexible sigmoidoscopy):  Excessive amounts of blood in the stool  Significant tenderness or worsening of abdominal pains  Swelling of the abdomen that is new, acute  Fever of 100F or higher   Following upper endoscopy (EGD)  Vomiting of blood or coffee ground material  New chest pain or pain under the shoulder blades  Painful or persistently difficult swallowing  New shortness of breath  Fever of 100F or higher  Black, tarry-looking stools  For urgent or emergent issues, a gastroenterologist can be reached at any hour by calling [phone]. Do not use MyChart messaging for urgent concerns.    DIET:  We do recommend a small meal at first, but  then you may proceed to your regular diet.  Drink plenty of fluids but you should avoid alcoholic beverages for 24 hours.  ACTIVITY:  You should plan to take it easy for the rest of today and you should NOT DRIVE or use heavy machinery until tomorrow (because of the sedation medicines used during the test).    FOLLOW UP: Our staff will call the number listed on your records 48-72 hours following your procedure to check on you and address any questions or concerns that you may have regarding the information given to you following your procedure. If we do not reach you, we will leave a message.  We will attempt to reach you two times.  During this call, we will ask if you have developed any symptoms of COVID 19. If you develop any symptoms (ie: fever, flu-like symptoms, shortness of breath, cough etc.) before then, please call [phone].  If you test positive for Covid 19 in the 2 weeks post procedure, please call and report this information to us.    If any biopsies were taken you will be contacted by phone or by letter within the next 1-3 weeks.  Please call us at [phone] if you have not heard about the biopsies in 3 weeks.    SIGNATURES/CONFIDENTIALITY: You and/or your care partner have signed paperwork which will be entered into your electronic medical record.  These signatures attest to the fact that that the information above on   your After Visit Summary has been reviewed and is understood.  Full responsibility of the confidentiality of this discharge information lies with you and/or your care-partner. 

## 2021-03-05 NOTE — Op Note (Signed)
[facility] Patient Name: Kristen Underwood Procedure Date: 03/05/2021 7:15 AM MRN: [id number] Endoscopist: Mauri Pole , MD Age: 65 Referring MD:  Date of Birth: 1956-02-28 Gender: Female Account #: [id number] Procedure:                Colonoscopy Indications:              Screening for colorectal malignant neoplasm,                            Incidental lower abdominal pain noted Medicines:                Monitored Anesthesia Care Procedure:                Pre-Anesthesia Assessment:                           - Prior to the procedure, a History and Physical                            was performed, and patient medications and                            allergies were reviewed. The patient's tolerance of                            previous anesthesia was also reviewed. The risks                            and benefits of the procedure and the sedation                            options and risks were discussed with the patient.                            All questions were answered, and informed consent                            was obtained. Prior Anticoagulants: The patient has                            taken no previous anticoagulant or antiplatelet                            agents. ASA Grade Assessment: II - A patient with                            mild systemic disease. After reviewing the risks                            and benefits, the patient was deemed in                            satisfactory condition to undergo the procedure.  After obtaining informed consent, the colonoscope                            was passed under direct vision. Throughout the                            procedure, the patient's blood pressure, pulse, and                            oxygen saturations were monitored continuously. The                            Olympus PFC-[id number]) Colonoscope was                            introduced through the anus  and advanced to the the                            cecum, identified by appendiceal orifice and                            ileocecal valve. The colonoscopy was performed                            without difficulty. The patient tolerated the                            procedure well. The quality of the bowel                            preparation was excellent. The ileocecal valve,                            appendiceal orifice, and rectum were photographed. Scope In: 8:15:19 AM Scope Out: 8:31:48 AM Scope Withdrawal Time: 0 hours 10 minutes 45 seconds  Total Procedure Duration: 0 hours 16 minutes 29 seconds  Findings:                 The perianal and digital rectal examinations were                            normal.                           A 4 mm polyp was found in the ascending colon. The                            polyp was sessile. The polyp was removed with a                            cold snare. Resection and retrieval were complete.                           Scattered small and large-mouthed diverticula were  found in the sigmoid colon and ascending colon.                            There was no evidence of diverticular bleeding.                           Non-bleeding internal hemorrhoids were found during                            retroflexion. The hemorrhoids were medium-sized.                           The exam was otherwise without abnormality. Complications:            No immediate complications. Estimated Blood Loss:     Estimated blood loss was minimal. Impression:               - One 4 mm polyp in the ascending colon, removed                            with a cold snare. Resected and retrieved.                           - Moderate diverticulosis in the sigmoid colon and                            in the ascending colon. There was no evidence of                            diverticular bleeding.                           - Non-bleeding  internal hemorrhoids.                           - The examination was otherwise normal. Recommendation:           - Patient has a contact number available for                            emergencies. The signs and symptoms of potential                            delayed complications were discussed with the                            patient. Return to normal activities tomorrow.                            Written discharge instructions were provided to the                            patient.                           - Resume previous diet.                           -  Continue present medications.                           - Await pathology results.                           - Repeat colonoscopy in 5-10 years for surveillance                            based on pathology results. Mauri Pole, MD 03/05/2021 8:42:52 AM This report has been signed electronically.

## 2021-03-05 NOTE — Progress Notes (Signed)
Called to room to assist during endoscopic procedure.  Patient ID and intended procedure confirmed with present staff. Received instructions for my participation in the procedure from the performing physician.  

## 2021-03-05 NOTE — Progress Notes (Signed)
PT taken to PACU. Monitors in place. VSS. Report given to RN. 

## 2021-03-05 NOTE — Progress Notes (Signed)
Medical history reviewed with no changes since PV. VS assessed by C.W 

## 2021-03-09 ENCOUNTER — Telehealth: Payer: Self-pay | Admitting: *Deleted

## 2021-03-09 NOTE — Telephone Encounter (Signed)
Left message on f/u call 

## 2021-03-24 ENCOUNTER — Other Ambulatory Visit: Payer: Self-pay | Admitting: Gastroenterology

## 2021-03-25 ENCOUNTER — Ambulatory Visit: Payer: BC Managed Care – PPO | Admitting: Gastroenterology

## 2021-03-25 ENCOUNTER — Other Ambulatory Visit (INDEPENDENT_AMBULATORY_CARE_PROVIDER_SITE_OTHER): Payer: BC Managed Care – PPO

## 2021-03-25 ENCOUNTER — Encounter: Payer: Self-pay | Admitting: Gastroenterology

## 2021-03-25 VITALS — BP 110/74 | HR 64 | Ht 62.0 in | Wt 129.0 lb

## 2021-03-25 DIAGNOSIS — R140 Abdominal distension (gaseous): Secondary | ICD-10-CM

## 2021-03-25 DIAGNOSIS — R1030 Lower abdominal pain, unspecified: Secondary | ICD-10-CM | POA: Diagnosis not present

## 2021-03-25 DIAGNOSIS — R1011 Right upper quadrant pain: Secondary | ICD-10-CM | POA: Diagnosis not present

## 2021-03-25 DIAGNOSIS — K589 Irritable bowel syndrome without diarrhea: Secondary | ICD-10-CM

## 2021-03-25 LAB — COMPREHENSIVE METABOLIC PANEL
ALT: 37 U/L — ABNORMAL HIGH (ref 0–35)
AST: 23 U/L (ref 0–37)
Albumin: 4.5 g/dL (ref 3.5–5.2)
Alkaline Phosphatase: 66 U/L (ref 39–117)
BUN: 22 mg/dL (ref 6–23)
CO2: 25 mEq/L (ref 19–32)
Calcium: 9.7 mg/dL (ref 8.4–10.5)
Chloride: 106 mEq/L (ref 96–112)
Creatinine, Ser: 0.84 mg/dL (ref 0.40–1.20)
GFR: 74.22 mL/min (ref >60.00)
Glucose, Bld: 100 mg/dL — ABNORMAL HIGH (ref 70–99)
Potassium: 3.9 mEq/L (ref 3.5–5.1)
Sodium: 140 mEq/L (ref 135–145)
Total Bilirubin: 0.6 mg/dL (ref 0.2–1.2)
Total Protein: 7 g/dL (ref 6.0–8.3)

## 2021-03-25 LAB — IBC + FERRITIN
Ferritin: 100.5 ng/mL (ref 10.0–291.0)
Iron: 102 ug/dL (ref 42–145)
Saturation Ratios: 27 % (ref 20.0–50.0)
Transferrin: 270 mg/dL (ref 212.0–360.0)

## 2021-03-25 NOTE — Progress Notes (Signed)
Kristen Underwood    [id number]    14-Sep-1956  Primary Care Physician:Lowne Cheri Rous Alferd Apa, DO  Referring Physician: Carollee Herter, Alferd Apa, DO [address] [place],   [id number]   Chief complaint: Colonoscopy, Abdominal pain  HPI:  65 year old very pleasant female here for follow-up visit for chronic abdominal pain. She has been having chronic lower abdominal pain intermittently for many years.  She did notice some improvement with dicyclomine, is not as severe as it was before. Her bowel habits are regular.  She is taking daily Metamucil.  She has been having right upper quadrant abdominal pain radiating to her back since December of last year.  She has mild elevation in transaminases, is thought to be secondary to fatty liver.  Colonoscopy February 02, 2021  - One 4 mm polyp (Tubular adenoma) in the ascending colon, removed with a cold snare. Resected and retrieved. - Moderate diverticulosis in the sigmoid colon and in the ascending colon. There was no evidence of diverticular bleeding. - Non-bleeding internal hemorrhoids. - The examination was otherwise normal.  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  No pelvic sonographic abnormalities identified.  Outpatient Encounter Medications as of 03/25/2021  Medication Sig  . amLODipine (NORVASC) 2.5 MG tablet Take 1 tablet (2.5 mg total) by mouth daily.  Marland Kitchen ammonium lactate (LAC-HYDRIN) 12 % cream Apply topically as needed for dry skin. (Patient not taking: Reported on 03/05/2021)  . aspirin 81 MG tablet Take 81 mg by mouth daily.  Marland Kitchen atorvastatin (LIPITOR) 20 MG tablet Take 1 tablet (20 mg total) by mouth daily.  . cholecalciferol (VITAMIN D) 1000 UNITS tablet Take 1,000 Units by mouth daily.  . Coenzyme Q10 (COQ10) 400 MG CAPS Take 1 tablet by mouth daily.  Marland Kitchen dicyclomine (BENTYL) 10 MG capsule TAKE 1 CAPSULE (10 MG TOTAL) BY MOUTH 4 (FOUR) TIMES  DAILY - BEFORE MEALS AND AT BEDTIME.  Marland Kitchen omeprazole (PRILOSEC) 20 MG capsule Take 20 mg by mouth daily.  . Pyridoxine HCl (VITAMIN B-6) 500 MG tablet Take 500 mg by mouth daily.   No facility-administered encounter medications on file as of 03/25/2021.    Allergies as of 03/25/2021  . (No Known Allergies)    Past Medical History:  Diagnosis Date  . Back pain   . BRCA negative   . Cough, persistent   . Elevated cholesterol   . GERD (gastroesophageal reflux disease)   . Hyperlipidemia   . Hypertension    10/09/13:  pt denies  . Migraine    is the reason she takes BP med  . Neck pain     Past Surgical History:  Procedure Laterality Date  . ABDOMINAL HYSTERECTOMY    . APPENDECTOMY    . CATARACT EXTRACTION Left 03/26/2020   Dr Nancy Fetter  . COLONOSCOPY  2008   [place]-Normal exam  . TONSILLECTOMY AND ADENOIDECTOMY  1966    Family History  Problem Relation Age of Onset  . Diabetes Mother   . Hyperlipidemia Mother   . Stroke Mother   . Depression Mother   . Obesity Mother   . Breast cancer Mother 88  . Cancer Father        PROSTATE  . Hypertension Father   . Mental illness Sister        had urosepsis--  and had schizo breakdown  . Breast cancer Maternal Aunt  in 70's  . Breast cancer Maternal Grandmother        in 26's  . Breast cancer Maternal Aunt        in 47's  . Breast cancer Maternal Aunt        in 79's  . Breast cancer Maternal Aunt        in 85's  . Colon cancer Neg Hx   . Colon polyps Neg Hx   . Esophageal cancer Neg Hx   . Rectal cancer Neg Hx   . Stomach cancer Neg Hx     Social History   Socioeconomic History  . Marital status: Married    Spouse name: Copywriter, advertising  . Number of children: 3  . Years of education: Not on file  . Highest education level: Not on file  Occupational History  . Occupation: Engineer, building services: [employer]  Tobacco Use  . Smoking status: Never Smoker  . Smokeless tobacco: Never Used  Vaping  Use  . Vaping Use: Never used  Substance and Sexual Activity  . Alcohol use: Yes    Alcohol/week: 1.0 standard drink    Types: 1 Glasses of wine per week    Comment: RARE,SOCIAL  . Drug use: No  . Sexual activity: Yes    Partners: Male  Other Topics Concern  . Not on file  Social History Narrative   Exercise--not lately   Social Determinants of Health   Financial Resource Strain: Not on file  Food Insecurity: Not on file  Transportation Needs: Not on file  Physical Activity: Not on file  Stress: Not on file  Social Connections: Not on file  Intimate Partner Violence: Not on file      Review of systems: All other review of systems negative except as mentioned in the HPI.   Physical Exam: Vitals:   03/25/21 1102  BP: 110/74  Pulse: 64   Body mass index is 23.59 kg/m. Gen:      No acute distress HEENT:  sclera anicteric Abd:      soft, non-tender; no palpable masses, no distension Ext:    No edema Neuro: alert and oriented x 3 Psych: normal mood and affect  Data Reviewed:  Reviewed labs, radiology imaging, old records and pertinent past GI work up   Assessment and Plan/Recommendations:  65 year old female with chronic lower abdominal pain likely secondary to irritable bowel syndrome and right upper quadrant abdominal pain  IBS-abdominal discomfort and bloating: Continue with high-fiber diet and increase water intake Continue Metamucil Use dicyclomine 10 mg every 6 hours as needed  Right upper quadrant abdominal pain: We will obtain abdominal ultrasound to exclude any acute liver pathology or gallbladder disease. We will also need to exclude nephrolithiasis or any other intra-abdominal acute pathology  Elevated transaminases: Follow-up CMP We will check ANA, anti-smooth muscle antibody, ferritin, iron panel, AMA, alpha-1 antitrypsin level, ceruloplasmin, hepatitis A antibody, hepatitis B surface antibody and surface antigen to exclude any other etiology of  chronic liver disease Continue with low-carb low-fat diet and exercise to improve steatohepatitis  Patient is worried about cancer, reassured her that her symptoms are atypical and are unlikely to be related to any cancer given the chronicity of her pain and work-up so far unrevealing for any malignancy or acute pathology  Return in 3 months for follow-up  This visit required 40 minutes of patient care (this includes precharting, chart review, review of results, face-to-face time used for counseling as well as treatment plan  and follow-up. The patient was provided an opportunity to ask questions and all were answered. The patient agreed with the plan and demonstrated an understanding of the instructions.  Damaris Hippo , MD    CC: Carollee Herter, Alferd Apa, *

## 2021-03-25 NOTE — Patient Instructions (Signed)
Your provider has requested that you go to the basement level for lab work before leaving today. Press "B" on the elevator. The lab is located at the first door on the left as you exit the elevator.  You have been scheduled for an abdominal ultrasound at [facility] (1st floor of hospital) on 5/25 at 8:30am. Please arrive 15 minutes prior to your appointment for registration. Make certain not to have anything to eat or drink 6 hours prior to your appointment. Should you need to reschedule your appointment, please contact radiology at [phone]. This test typically takes about 30 minutes to perform.  We will refill your dicyclomine  Continue high fiber diet and water  Due to recent changes in healthcare laws, you may see the results of your imaging and laboratory studies on MyChart before your provider has had a chance to review them.  We understand that in some cases there may be results that are confusing or concerning to you. Not all laboratory results come back in the same time frame and the provider may be waiting for multiple results in order to interpret others.  Please give [name] 48 hours in order for your provider to thoroughly review all the results before contacting the office for clarification of your results.   I appreciate the  opportunity to care for you  Thank You   Harl Bowie , MD

## 2021-03-26 ENCOUNTER — Encounter: Payer: Self-pay | Admitting: Gastroenterology

## 2021-03-28 LAB — ANTI-NUCLEAR AB-TITER (ANA TITER): ANA Titer 1: 1:40 {titer} — ABNORMAL HIGH

## 2021-03-28 LAB — ANA: Anti Nuclear Antibody (ANA): POSITIVE — AB

## 2021-03-28 LAB — CERULOPLASMIN: Ceruloplasmin: 26 mg/dL (ref 18–53)

## 2021-03-28 LAB — HEPATITIS B SURFACE ANTIBODY,QUALITATIVE: Hep B S Ab: NONREACTIVE

## 2021-03-28 LAB — HEPATITIS B SURFACE ANTIGEN: Hepatitis B Surface Ag: NONREACTIVE

## 2021-03-28 LAB — ALPHA-1-ANTITRYPSIN: A-1 Antitrypsin, Ser: 153 mg/dL (ref 83–199)

## 2021-03-28 LAB — MITOCHONDRIAL ANTIBODIES: Mitochondrial M2 Ab, IgG: <=20 U

## 2021-03-28 LAB — HEPATITIS A ANTIBODY, TOTAL: Hepatitis A AB,Total: NONREACTIVE

## 2021-03-28 LAB — AFP TUMOR MARKER: AFP-Tumor Marker: 4.8 ng/mL

## 2021-03-28 LAB — ANTI-SMOOTH MUSCLE ANTIBODY, IGG: Actin (Smooth Muscle) Antibody (IGG): <20 U (ref <20)

## 2021-03-31 ENCOUNTER — Other Ambulatory Visit: Payer: Self-pay

## 2021-03-31 ENCOUNTER — Ambulatory Visit (HOSPITAL_COMMUNITY)
Admission: RE | Admit: 2021-03-31 | Discharge: 2021-03-31 | Disposition: A | Discharge status: Home / Self Care | Payer: BC Managed Care – PPO | Source: Ambulatory Visit | Attending: Gastroenterology | Admitting: Gastroenterology

## 2021-03-31 DIAGNOSIS — R1030 Lower abdominal pain, unspecified: Secondary | ICD-10-CM | POA: Diagnosis present

## 2021-03-31 DIAGNOSIS — R1011 Right upper quadrant pain: Secondary | ICD-10-CM | POA: Insufficient documentation

## 2021-03-31 DIAGNOSIS — R140 Abdominal distension (gaseous): Secondary | ICD-10-CM | POA: Diagnosis present

## 2021-03-31 DIAGNOSIS — K589 Irritable bowel syndrome without diarrhea: Secondary | ICD-10-CM | POA: Diagnosis present

## 2021-04-16 ENCOUNTER — Other Ambulatory Visit: Payer: Self-pay | Admitting: Gastroenterology

## 2021-05-12 ENCOUNTER — Other Ambulatory Visit: Payer: Self-pay | Admitting: Gastroenterology

## 2021-05-21 ENCOUNTER — Encounter: Payer: Self-pay | Admitting: Family Medicine

## 2021-05-21 NOTE — Telephone Encounter (Signed)
DOD: Is this something we are allowed to do? Please advise

## 2021-06-03 ENCOUNTER — Other Ambulatory Visit: Payer: Self-pay | Admitting: Gastroenterology

## 2021-06-09 ENCOUNTER — Other Ambulatory Visit: Payer: Self-pay | Admitting: Family Medicine

## 2021-06-09 DIAGNOSIS — E785 Hyperlipidemia, unspecified: Secondary | ICD-10-CM

## 2021-06-09 DIAGNOSIS — E782 Mixed hyperlipidemia: Secondary | ICD-10-CM

## 2021-06-09 DIAGNOSIS — I10 Essential (primary) hypertension: Secondary | ICD-10-CM

## 2021-06-24 ENCOUNTER — Other Ambulatory Visit: Payer: Self-pay | Admitting: Family Medicine

## 2021-06-24 DIAGNOSIS — E782 Mixed hyperlipidemia: Secondary | ICD-10-CM

## 2021-06-24 DIAGNOSIS — I10 Essential (primary) hypertension: Secondary | ICD-10-CM

## 2021-06-24 DIAGNOSIS — E785 Hyperlipidemia, unspecified: Secondary | ICD-10-CM

## 2021-06-30 ENCOUNTER — Ambulatory Visit: Payer: BC Managed Care – PPO | Admitting: Gastroenterology

## 2021-07-14 ENCOUNTER — Other Ambulatory Visit: Payer: Self-pay

## 2021-07-14 ENCOUNTER — Ambulatory Visit
Admission: RE | Admit: 2021-07-14 | Discharge: 2021-07-14 | Disposition: A | Discharge status: Home / Self Care | Payer: BC Managed Care – PPO | Source: Ambulatory Visit | Attending: Family Medicine | Admitting: Family Medicine

## 2021-07-14 DIAGNOSIS — E2839 Other primary ovarian failure: Secondary | ICD-10-CM

## 2021-07-20 ENCOUNTER — Encounter: Payer: Self-pay | Admitting: Family Medicine

## 2021-07-21 NOTE — Telephone Encounter (Signed)
Would you like a OV?

## 2021-07-22 ENCOUNTER — Telehealth: Payer: BC Managed Care – PPO | Admitting: Physician Assistant

## 2021-07-22 DIAGNOSIS — B373 Candidiasis of vulva and vagina: Secondary | ICD-10-CM

## 2021-07-22 DIAGNOSIS — B3731 Acute candidiasis of vulva and vagina: Secondary | ICD-10-CM

## 2021-07-22 MED ORDER — FLUCONAZOLE 150 MG PO TABS: 150.0000 mg | ORAL_TABLET | Freq: Once | ORAL | 0 refills | Status: AC

## 2021-07-22 NOTE — Progress Notes (Signed)

## 2021-07-22 NOTE — Progress Notes (Signed)
I have spent 5 minutes in review of e-visit questionnaire, review and updating patient chart, medical decision making and response to patient.   Nirvaan Frett Cody Ramirez Fullbright, PA-C    

## 2021-07-24 ENCOUNTER — Telehealth: Payer: BC Managed Care – PPO | Admitting: Physician Assistant

## 2021-07-24 DIAGNOSIS — B373 Candidiasis of vulva and vagina: Secondary | ICD-10-CM

## 2021-07-24 DIAGNOSIS — B3731 Acute candidiasis of vulva and vagina: Secondary | ICD-10-CM

## 2021-07-24 MED ORDER — FLUCONAZOLE 150 MG PO TABS: 150.0000 mg | ORAL_TABLET | Freq: Once | ORAL | 0 refills | Status: AC

## 2021-07-24 NOTE — Progress Notes (Signed)
Patient with recent visit for yeast vaginitis. Given Diflucan 150 mg, taking as directed with substantial improvement in symptoms. Some very slight residual symptoms so additional diflucan given. She is to follow-up with PCP if not resolving.   Message to Patient:  Kristen Underwood,  I am glad that symptoms are improved, even if not completely resolved yet. Sometimes it can take a couple of doses of the Diflucan. I will send in a second tablet that can be taken tomorrow -- we space doses out for this by 3 days usually to give each dose time to do all that it is going to do. I will send to your pharmacy for you. If any residual symptoms after the next dose has had time to work, then you need to follow-up with your primary care for a further evaluation. Take care!

## 2021-07-24 NOTE — Progress Notes (Signed)
I have spent 5 minutes in review of e-visit questionnaire, review and updating patient chart, medical decision making and response to patient.   Dajon Lazar Cody Keyshon Stein, PA-C    

## 2021-08-24 ENCOUNTER — Other Ambulatory Visit: Payer: Self-pay | Admitting: Family Medicine

## 2021-08-24 DIAGNOSIS — G43809 Other migraine, not intractable, without status migrainosus: Secondary | ICD-10-CM

## 2021-08-24 DIAGNOSIS — I10 Essential (primary) hypertension: Secondary | ICD-10-CM

## 2021-09-27 ENCOUNTER — Encounter: Payer: Self-pay | Admitting: Family Medicine

## 2021-09-27 ENCOUNTER — Ambulatory Visit: Payer: BC Managed Care – PPO | Admitting: Family Medicine

## 2021-09-27 NOTE — Telephone Encounter (Signed)
noted 

## 2021-11-25 ENCOUNTER — Other Ambulatory Visit: Payer: Self-pay | Admitting: Family Medicine

## 2021-11-25 DIAGNOSIS — Z1231 Encounter for screening mammogram for malignant neoplasm of breast: Secondary | ICD-10-CM

## 2021-12-13 ENCOUNTER — Ambulatory Visit
Admission: RE | Admit: 2021-12-13 | Discharge: 2021-12-13 | Disposition: A | Discharge status: Home / Self Care | Payer: BC Managed Care – PPO | Source: Ambulatory Visit | Attending: Family Medicine | Admitting: Family Medicine

## 2021-12-13 DIAGNOSIS — Z1231 Encounter for screening mammogram for malignant neoplasm of breast: Secondary | ICD-10-CM

## 2022-01-08 ENCOUNTER — Other Ambulatory Visit: Payer: Self-pay | Admitting: Family Medicine

## 2022-01-08 DIAGNOSIS — I10 Essential (primary) hypertension: Secondary | ICD-10-CM

## 2022-01-08 DIAGNOSIS — E782 Mixed hyperlipidemia: Secondary | ICD-10-CM

## 2022-01-08 DIAGNOSIS — E785 Hyperlipidemia, unspecified: Secondary | ICD-10-CM

## 2022-02-07 ENCOUNTER — Other Ambulatory Visit: Payer: Self-pay | Admitting: Family Medicine

## 2022-02-07 DIAGNOSIS — E782 Mixed hyperlipidemia: Secondary | ICD-10-CM

## 2022-02-07 DIAGNOSIS — I10 Essential (primary) hypertension: Secondary | ICD-10-CM

## 2022-02-07 DIAGNOSIS — E785 Hyperlipidemia, unspecified: Secondary | ICD-10-CM

## 2022-02-27 ENCOUNTER — Other Ambulatory Visit: Payer: Self-pay | Admitting: Family Medicine

## 2022-02-27 DIAGNOSIS — I10 Essential (primary) hypertension: Secondary | ICD-10-CM

## 2022-02-27 DIAGNOSIS — G43809 Other migraine, not intractable, without status migrainosus: Secondary | ICD-10-CM

## 2022-03-08 ENCOUNTER — Other Ambulatory Visit: Payer: Self-pay | Admitting: Family Medicine

## 2022-03-08 DIAGNOSIS — I10 Essential (primary) hypertension: Secondary | ICD-10-CM

## 2022-03-08 DIAGNOSIS — E782 Mixed hyperlipidemia: Secondary | ICD-10-CM

## 2022-03-08 DIAGNOSIS — E785 Hyperlipidemia, unspecified: Secondary | ICD-10-CM

## 2022-03-17 ENCOUNTER — Other Ambulatory Visit: Payer: Self-pay | Admitting: Family Medicine

## 2022-03-17 DIAGNOSIS — E782 Mixed hyperlipidemia: Secondary | ICD-10-CM

## 2022-03-17 DIAGNOSIS — I10 Essential (primary) hypertension: Secondary | ICD-10-CM

## 2022-03-17 DIAGNOSIS — E785 Hyperlipidemia, unspecified: Secondary | ICD-10-CM

## 2022-03-21 ENCOUNTER — Other Ambulatory Visit: Payer: Self-pay | Admitting: Family Medicine

## 2022-03-21 DIAGNOSIS — E782 Mixed hyperlipidemia: Secondary | ICD-10-CM

## 2022-03-21 DIAGNOSIS — E785 Hyperlipidemia, unspecified: Secondary | ICD-10-CM

## 2022-03-21 DIAGNOSIS — I10 Essential (primary) hypertension: Secondary | ICD-10-CM

## 2022-03-22 ENCOUNTER — Other Ambulatory Visit: Payer: Self-pay | Admitting: Family Medicine

## 2022-03-22 DIAGNOSIS — I10 Essential (primary) hypertension: Secondary | ICD-10-CM

## 2022-03-22 DIAGNOSIS — G43809 Other migraine, not intractable, without status migrainosus: Secondary | ICD-10-CM

## 2022-04-26 IMAGING — MG MM DIGITAL SCREENING BILAT W/ TOMO AND CAD
8 series · 9 of 24 positions shown · non-contrast
Comparison: Previous exam(s).

CLINICAL DATA: Screening.

EXAM:
DIGITAL SCREENING BILATERAL MAMMOGRAM WITH TOMOSYNTHESIS AND CAD
TECHNIQUE: Bilateral screening digital craniocaudal and mediolateral oblique
mammograms were obtained. Bilateral screening digital breast
tomosynthesis was performed. The images were evaluated with
computer-aided detection.

[L MLO synth-2D]
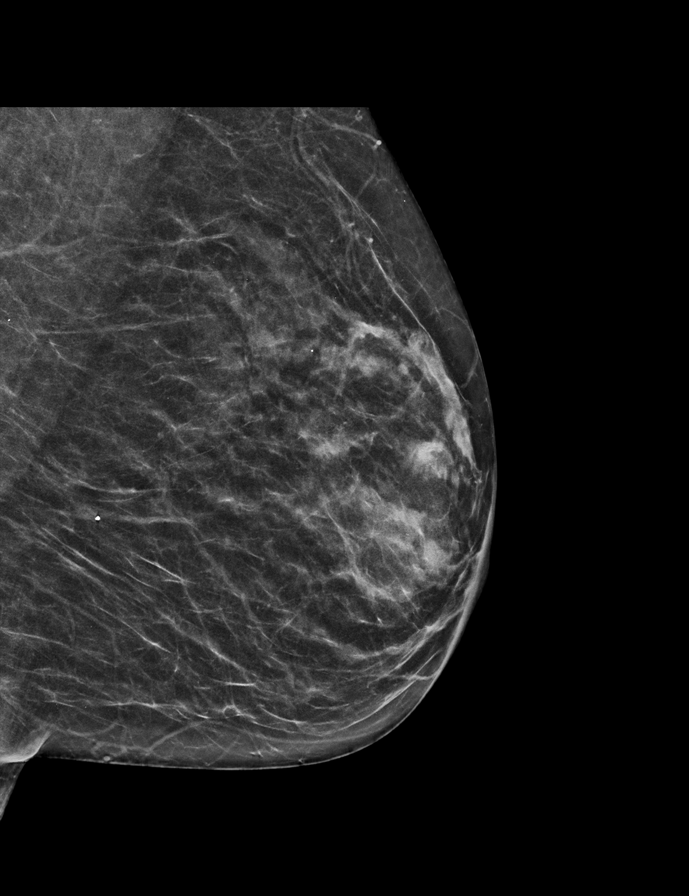

[R CC synth-2D]
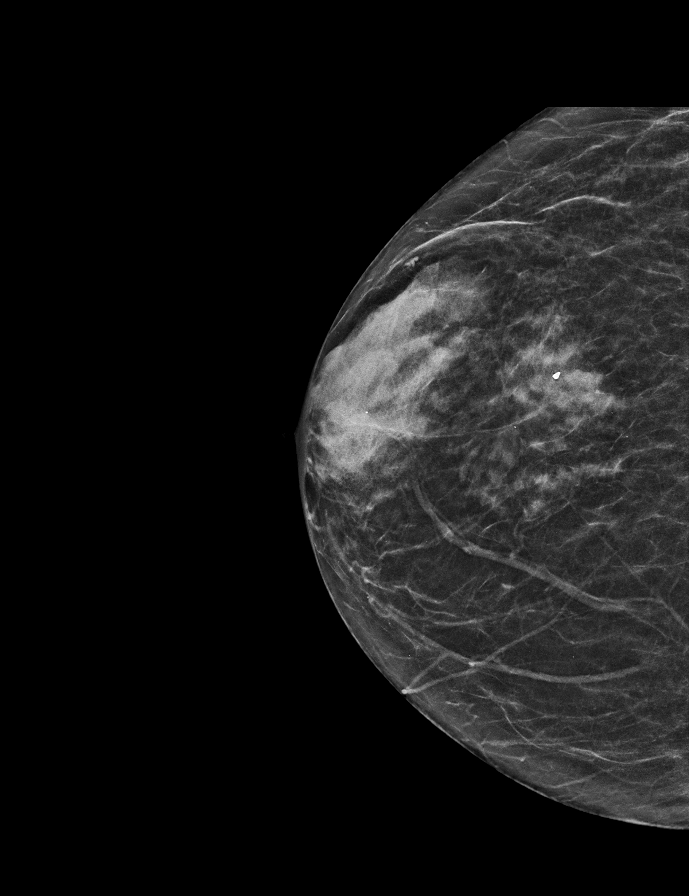

[R MLO synth-2D]
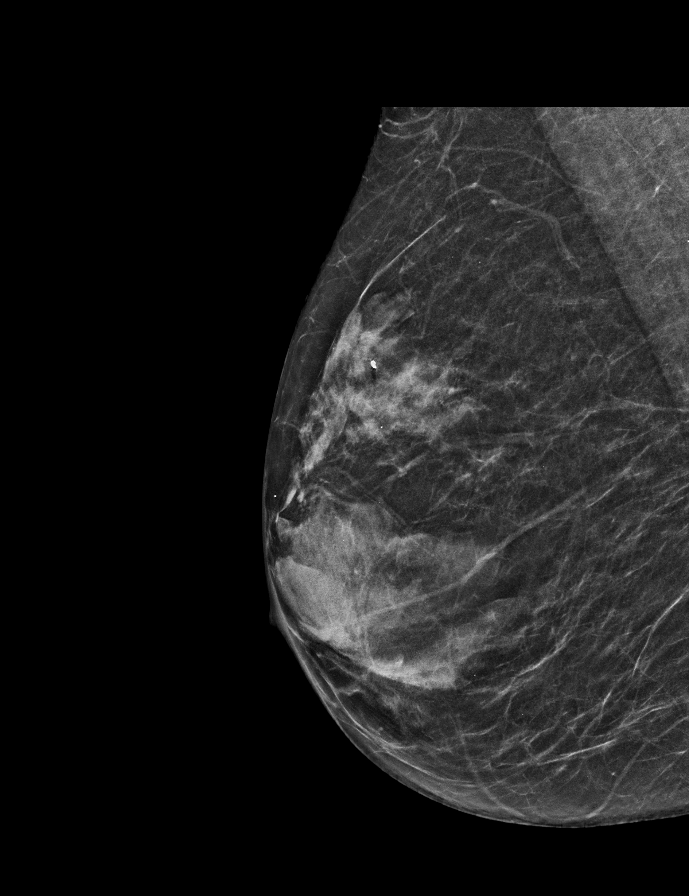

[L CC synth-2D]
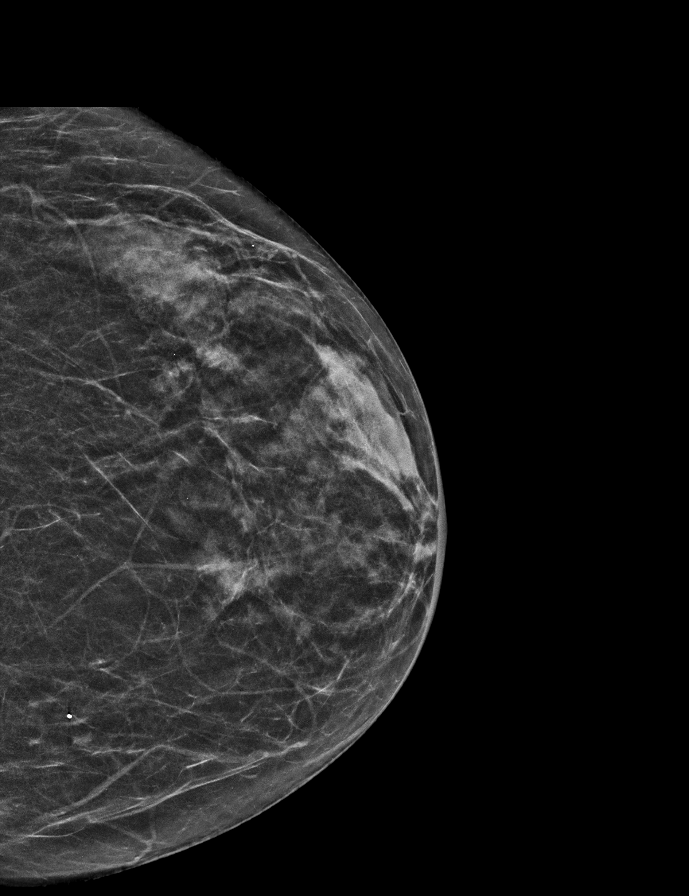

[L MLO tomo · 2 of 51 frames shown]
[frame 17/51]
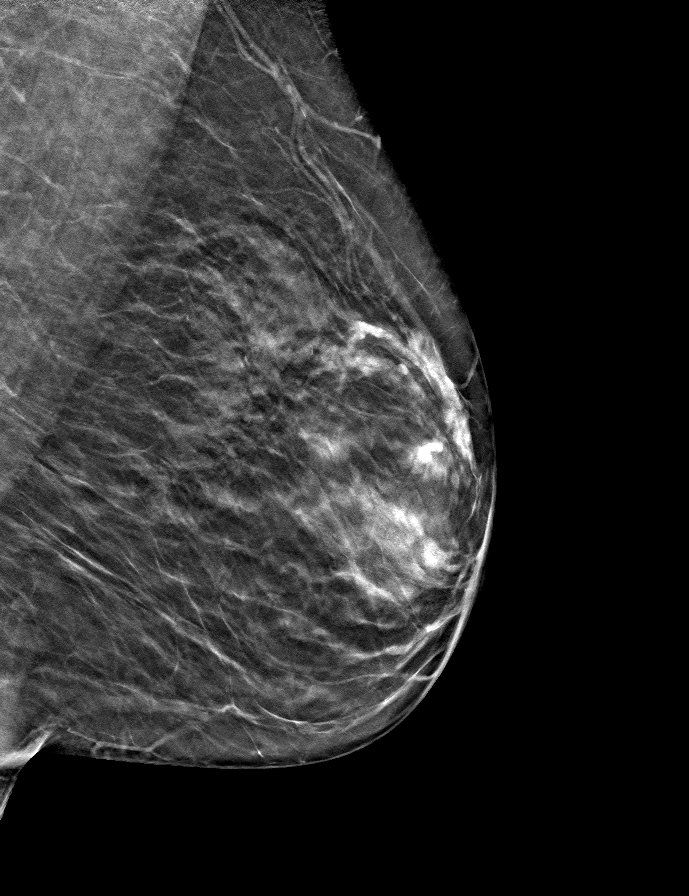
[frame 26/51]
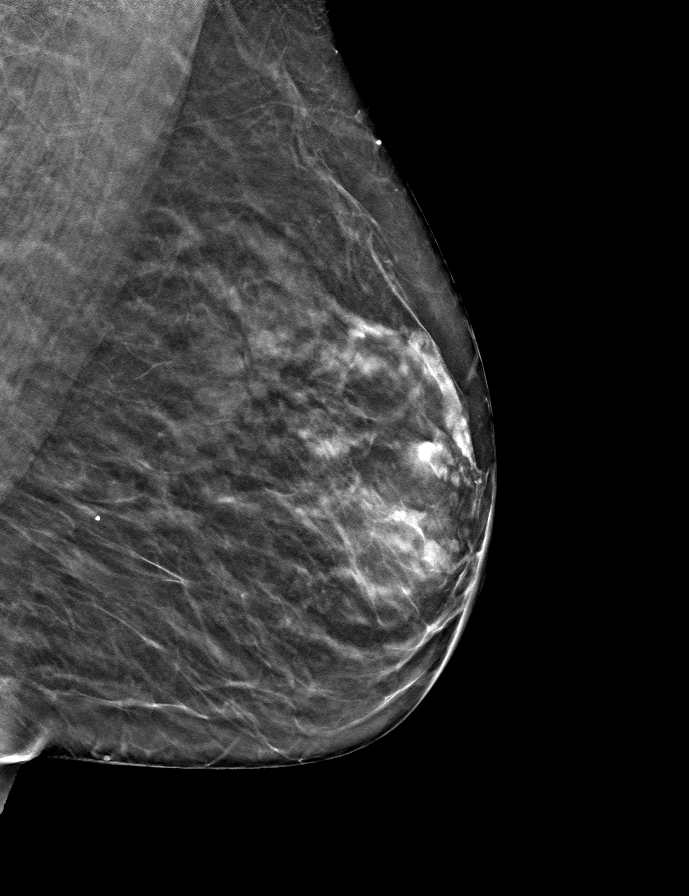

[L CC tomo · tomo slice 23/45.0]
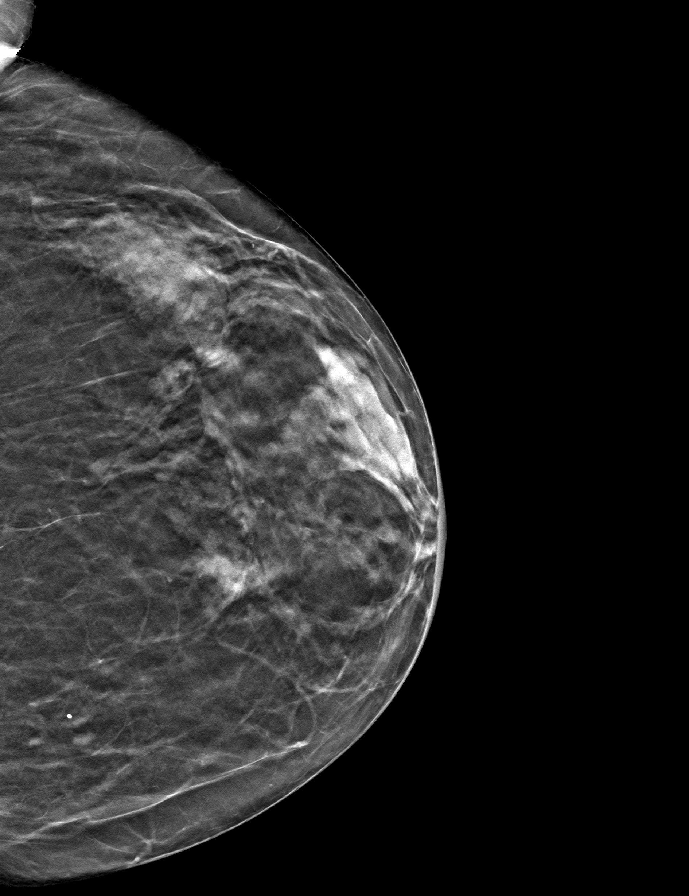

[R CC tomo · tomo slice 23/45.0]
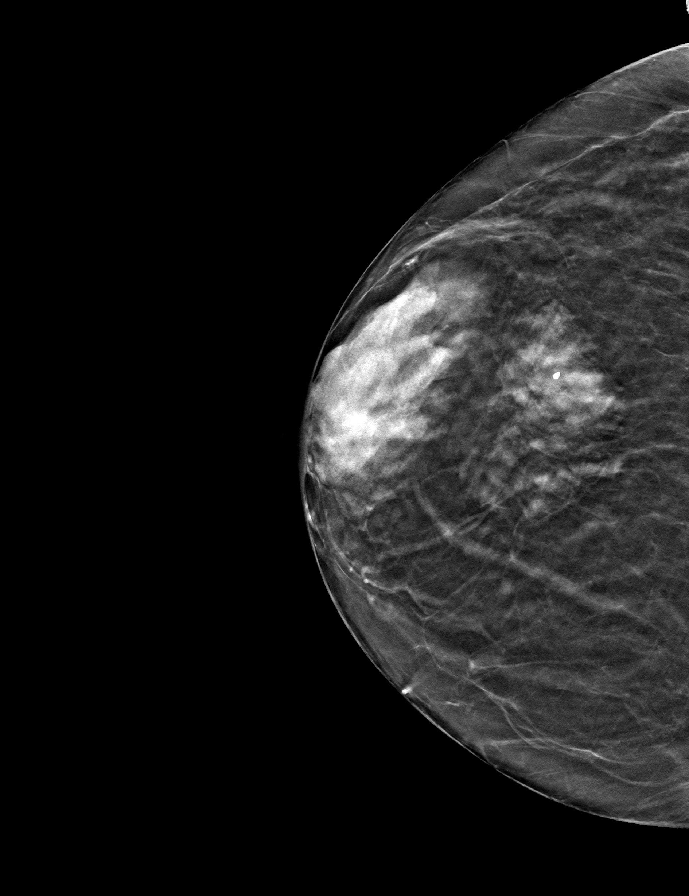

[R MLO tomo · tomo slice 24/47.0]
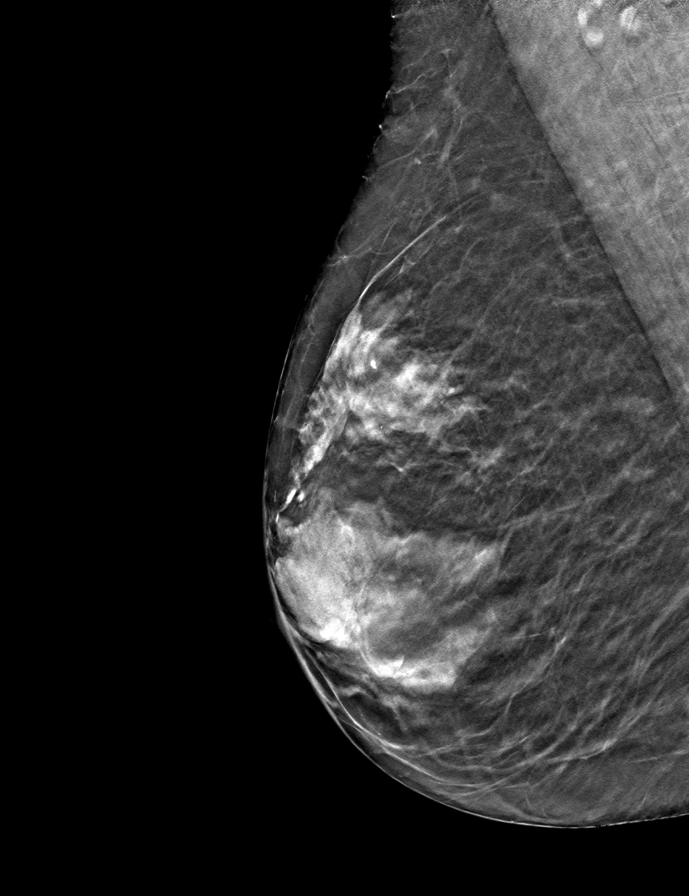

[9 of 24 positions shown; findings below may reference images not displayed]

ACR Breast Density Category b: There are scattered areas of
fibroglandular density.
FINDINGS: There are no findings suspicious for malignancy.
IMPRESSION: No mammographic evidence of malignancy. A result letter of this
screening mammogram will be mailed directly to the patient.

RECOMMENDATION:
Screening mammogram in one year. (Code:51-O-LD2)

BI-RADS CATEGORY  1: Negative.

## 2022-05-23 ENCOUNTER — Other Ambulatory Visit: Payer: Self-pay | Admitting: Family Medicine

## 2022-05-23 DIAGNOSIS — E785 Hyperlipidemia, unspecified: Secondary | ICD-10-CM

## 2022-05-23 DIAGNOSIS — E782 Mixed hyperlipidemia: Secondary | ICD-10-CM

## 2022-05-23 DIAGNOSIS — I10 Essential (primary) hypertension: Secondary | ICD-10-CM

## 2022-06-21 ENCOUNTER — Other Ambulatory Visit: Payer: Self-pay | Admitting: Family Medicine

## 2022-06-21 DIAGNOSIS — I10 Essential (primary) hypertension: Secondary | ICD-10-CM

## 2022-06-21 DIAGNOSIS — E785 Hyperlipidemia, unspecified: Secondary | ICD-10-CM

## 2022-06-21 DIAGNOSIS — E782 Mixed hyperlipidemia: Secondary | ICD-10-CM

## 2022-06-22 NOTE — Telephone Encounter (Signed)
Scheduled for 06/23/22

## 2022-06-23 ENCOUNTER — Telehealth (INDEPENDENT_AMBULATORY_CARE_PROVIDER_SITE_OTHER): Payer: BC Managed Care – PPO | Admitting: Family Medicine

## 2022-06-23 ENCOUNTER — Encounter: Payer: Self-pay | Admitting: Family Medicine

## 2022-06-23 VITALS — BP 113/71

## 2022-06-23 DIAGNOSIS — I10 Essential (primary) hypertension: Secondary | ICD-10-CM

## 2022-06-23 DIAGNOSIS — G43809 Other migraine, not intractable, without status migrainosus: Secondary | ICD-10-CM

## 2022-06-23 DIAGNOSIS — E785 Hyperlipidemia, unspecified: Secondary | ICD-10-CM

## 2022-06-23 MED ORDER — AMLODIPINE BESYLATE 2.5 MG PO TABS
2.5000 mg | ORAL_TABLET | Freq: Every day | ORAL | 1 refills | Status: DC
Start: 2022-06-23 — End: 2022-12-19

## 2022-06-23 NOTE — Assessment & Plan Note (Signed)
Encourage heart healthy diet such as MIND or DASH diet, increase exercise, avoid trans fats, simple carbohydrates and processed foods, consider a krill or fish or flaxseed oil cap daily.  °

## 2022-06-23 NOTE — Progress Notes (Signed)
MyChart Video Visit    Virtual Visit via Video Note   This visit type was conducted due to national recommendations for restrictions regarding the COVID-19 Pandemic (e.g. social distancing) in an effort to limit this patient's exposure and mitigate transmission in our community. This patient is at least at moderate risk for complications without adequate follow up. This format is felt to be most appropriate for this patient at this time. Physical exam was limited by quality of the video and audio technology used for the visit. Kristen Underwood was able to get the patient set up on a video visit.  Patient location: Home Patient and provider in visit Provider location: Office  I discussed the limitations of evaluation and management by telemedicine and the availability of in person appointments. The patient expressed understanding and agreed to proceed.  Visit Date: 06/23/2022  Today's healthcare provider: Ann Held, DO     Subjective:    Patient ID: Kristen Underwood, female    DOB: [birth year], 66 y.o.   MRN: [id number]  No chief complaint on file.   HPI Patient is in today for a video visit to f/u bp and cholesterol.  No complaints   She is requesting a refill for 2.5 mg amlodipine daily PO. She measures her blood regularly and reports her baseline is typically 113/74. Her higher diastolic blood pressure typically measures around 87. She recently joined a gym and is exercising regularly. She continues maintaining a healthy diet.  BP Readings from Last 3 Encounters:  06/23/22 113/71  03/25/21 110/74  03/05/21 107/71   Pulse Readings from Last 3 Encounters:  03/25/21 64  03/05/21 72  12/17/20 84   She is interested in completing lab work at another time.    Past Medical History:  Diagnosis Date   Back pain    BRCA negative    Cough, persistent    Elevated cholesterol    GERD (gastroesophageal reflux disease)    Hyperlipidemia    Hypertension    10/09/13:   pt denies   Migraine    is the reason she takes BP med   Neck pain     Past Surgical History:  Procedure Laterality Date   ABDOMINAL HYSTERECTOMY     APPENDECTOMY     CATARACT EXTRACTION Left 03/26/2020   Dr Nancy Fetter   COLONOSCOPY  2008   [place]-Normal exam   TONSILLECTOMY AND ADENOIDECTOMY  1966    Family History  Problem Relation Age of Onset   Diabetes Mother    Hyperlipidemia Mother    Stroke Mother    Depression Mother    Obesity Mother    Breast cancer Mother 48   Cancer Father        PROSTATE   Hypertension Father    Mental illness Sister        had urosepsis--  and had schizo breakdown   Breast cancer Maternal Aunt        in 70's   Breast cancer Maternal Grandmother        in 50's   Breast cancer Maternal Aunt        in 70's   Breast cancer Maternal Aunt        in 70's   Breast cancer Maternal Aunt        in 70's   Colon cancer Neg Hx    Colon polyps Neg Hx    Esophageal cancer Neg Hx    Rectal cancer Neg Hx    Stomach  cancer Neg Hx     Social History   Socioeconomic History   Marital status: Married    Spouse name: Copywriter, advertising   Number of children: 3   Years of education: Not on file   Highest education level: Not on file  Occupational History   Occupation: PSYCHOLOGIST    Employer: [employer]  Tobacco Use   Smoking status: Never   Smokeless tobacco: Never  Vaping Use   Vaping Use: Never used  Substance and Sexual Activity   Alcohol use: Yes    Alcohol/week: 1.0 standard drink of alcohol    Types: 1 Glasses of wine per week    Comment: RARE,SOCIAL   Drug use: No   Sexual activity: Yes    Partners: Male  Other Topics Concern   Not on file  Social History Narrative   Exercise--not lately   Social Determinants of Health   Financial Resource Strain: Not on file  Food Insecurity: Not on file  Transportation Needs: Not on file  Physical Activity: Not on file  Stress: Not on file  Social Connections: Not on file   Intimate Partner Violence: Not on file    Outpatient Medications Prior to Visit  Medication Sig Dispense Refill   ammonium lactate (LAC-HYDRIN) 12 % cream Apply topically as needed for dry skin. (Patient not taking: Reported on 03/05/2021) 385 g 0   aspirin 81 MG tablet Take 81 mg by mouth daily.     atorvastatin (LIPITOR) 20 MG tablet TAKE 1 TABLET (20 MG TOTAL) BY MOUTH DAILY. PT NEEDS OFFICE VISIT FOR FURTHER REFILLS 30 tablet 0   cholecalciferol (VITAMIN D) 1000 UNITS tablet Take 1,000 Units by mouth daily.     Coenzyme Q10 (COQ10) 400 MG CAPS Take 1 tablet by mouth daily. 30 capsule 0   dicyclomine (BENTYL) 10 MG capsule TAKE 1 CAPSULE BY MOUTH 4 TIMES DAILY - BEFORE MEALS AND AT BEDTIME. 90 capsule 0   omeprazole (PRILOSEC) 20 MG capsule Take 20 mg by mouth daily.     Pyridoxine HCl (VITAMIN B-6) 500 MG tablet Take 500 mg by mouth daily.     amLODipine (NORVASC) 2.5 MG tablet Take 1 tablet (2.5 mg total) by mouth daily. Pt due for office visit for further refills. 14 tablet 0   No facility-administered medications prior to visit.    No Known Allergies  Review of Systems  Constitutional:  Negative for fever and malaise/fatigue.  HENT:  Negative for congestion.   Eyes:  Negative for blurred vision.  Respiratory:  Negative for shortness of breath.   Cardiovascular:  Negative for chest pain, palpitations and leg swelling.  Gastrointestinal:  Negative for abdominal pain, blood in stool and nausea.  Genitourinary:  Negative for dysuria and frequency.  Musculoskeletal:  Negative for falls.  Skin:  Negative for rash.  Neurological:  Negative for dizziness, loss of consciousness and headaches.  Endo/Heme/Allergies:  Negative for environmental allergies.  Psychiatric/Behavioral:  Negative for depression. The patient is not nervous/anxious.        Objective:    Physical Exam Vitals and nursing note reviewed.  Pulmonary:     Effort: Pulmonary effort is normal.  Neurological:      General: No focal deficit present.     Mental Status: She is alert and oriented to person, place, and time.  Psychiatric:        Mood and Affect: Mood normal.        Behavior: Behavior normal.     BP  113/71  Wt Readings from Last 3 Encounters:  03/25/21 129 lb (58.5 kg)  03/05/21 129 lb (58.5 kg)  02/22/21 129 lb (58.5 kg)    Diabetic Foot Exam - Simple   No data filed    Lab Results  Component Value Date   WBC 4.8 12/09/2020   HGB 15.2 (H) 12/09/2020   HCT 44.6 12/09/2020   PLT 265 12/09/2020   GLUCOSE 100 (H) 03/25/2021   CHOL 233 (H) 10/06/2020   TRIG 59.0 10/06/2020   HDL 93.20 10/06/2020   LDLDIRECT 151.8 07/22/2010   LDLCALC 128 (H) 10/06/2020   ALT 37 (H) 03/25/2021   AST 23 03/25/2021   NA 140 03/25/2021   K 3.9 03/25/2021   CL 106 03/25/2021   CREATININE 0.84 03/25/2021   BUN 22 03/25/2021   CO2 25 03/25/2021   TSH 2.01 03/31/2020   HGBA1C 5.5 10/09/2018    Lab Results  Component Value Date   TSH 2.01 03/31/2020   Lab Results  Component Value Date   WBC 4.8 12/09/2020   HGB 15.2 (H) 12/09/2020   HCT 44.6 12/09/2020   MCV 91.2 12/09/2020   PLT 265 12/09/2020   Lab Results  Component Value Date   NA 140 03/25/2021   K 3.9 03/25/2021   CO2 25 03/25/2021   GLUCOSE 100 (H) 03/25/2021   BUN 22 03/25/2021   CREATININE 0.84 03/25/2021   BILITOT 0.6 03/25/2021   ALKPHOS 66 03/25/2021   AST 23 03/25/2021   ALT 37 (H) 03/25/2021   PROT 7.0 03/25/2021   ALBUMIN 4.5 03/25/2021   CALCIUM 9.7 03/25/2021   ANIONGAP 9 12/09/2020   GFR 74.22 03/25/2021   Lab Results  Component Value Date   CHOL 233 (H) 10/06/2020   Lab Results  Component Value Date   HDL 93.20 10/06/2020   Lab Results  Component Value Date   LDLCALC 128 (H) 10/06/2020   Lab Results  Component Value Date   TRIG 59.0 10/06/2020   Lab Results  Component Value Date   CHOLHDL 3 10/06/2020   Lab Results  Component Value Date   HGBA1C 5.5 10/09/2018       Assessment &  Plan:   Problem List Items Addressed This Visit       Unprioritized   Hyperlipidemia    Encourage heart healthy diet such as MIND or DASH diet, increase exercise, avoid trans fats, simple carbohydrates and processed foods, consider a krill or fish or flaxseed oil cap daily.       Relevant Medications   amLODipine (NORVASC) 2.5 MG tablet   Other Relevant Orders   CBC with Differential/Platelet   Comprehensive metabolic panel   Lipid panel   Essential hypertension - Primary    Well controlled, no changes to meds. Encouraged heart healthy diet such as the DASH diet and exercise as tolerated.       Relevant Medications   amLODipine (NORVASC) 2.5 MG tablet   Other Relevant Orders   CBC with Differential/Platelet   Comprehensive metabolic panel   Lipid panel   Other Visit Diagnoses     Essential (primary) hypertension       Relevant Medications   amLODipine (NORVASC) 2.5 MG tablet   Other migraine, not intractable, without status migrainosus       Relevant Medications   amLODipine (NORVASC) 2.5 MG tablet        Meds ordered this encounter  Medications   amLODipine (NORVASC) 2.5 MG tablet    Sig: Take 1  tablet (2.5 mg total) by mouth daily.    Dispense:  90 tablet    Refill:  1    I discussed the assessment and treatment plan with the patient. The patient was provided an opportunity to ask questions and all were answered. The patient agreed with the plan and demonstrated an understanding of the instructions.   The patient was advised to call back or seek an in-person evaluation if the symptoms worsen or if the condition fails to improve as anticipated.  I provided 20 minutes of face-to-face time during this encounter.   Ann Held, DO [place] [facility] [employer] [facility] [phone] (phone) [phone] (fax)  [place]    I,Shehryar Baig,acting as a scribe for Ann Held, DO.,have documented all relevant  documentation on the behalf of Ann Held, DO,as directed by  Ann Held, DO while in the presence of Ann Held, DO.

## 2022-06-23 NOTE — Assessment & Plan Note (Signed)
Well controlled, no changes to meds. Encouraged heart healthy diet such as the DASH diet and exercise as tolerated.  °

## 2022-06-25 ENCOUNTER — Encounter: Payer: Self-pay | Admitting: Family Medicine

## 2022-06-28 ENCOUNTER — Other Ambulatory Visit (INDEPENDENT_AMBULATORY_CARE_PROVIDER_SITE_OTHER): Payer: BC Managed Care – PPO

## 2022-06-28 DIAGNOSIS — I10 Essential (primary) hypertension: Secondary | ICD-10-CM | POA: Diagnosis not present

## 2022-06-28 DIAGNOSIS — E785 Hyperlipidemia, unspecified: Secondary | ICD-10-CM | POA: Diagnosis not present

## 2022-06-28 LAB — CBC WITH DIFFERENTIAL/PLATELET
Basophils Absolute: 0.1 10*3/uL (ref 0.0–0.1)
Basophils Relative: 1.2 % (ref 0.0–3.0)
Eosinophils Absolute: 0.4 10*3/uL (ref 0.0–0.7)
Eosinophils Relative: 7.7 % — ABNORMAL HIGH (ref 0.0–5.0)
HCT: 41 % (ref 36.0–46.0)
Hemoglobin: 13.8 g/dL (ref 12.0–15.0)
Lymphocytes Relative: 36.7 % (ref 12.0–46.0)
Lymphs Abs: 1.8 10*3/uL (ref 0.7–4.0)
MCHC: 33.7 g/dL (ref 30.0–36.0)
MCV: 92.5 fl (ref 78.0–100.0)
Monocytes Absolute: 0.3 10*3/uL (ref 0.1–1.0)
Monocytes Relative: 5.4 % (ref 3.0–12.0)
Neutro Abs: 2.4 10*3/uL (ref 1.4–7.7)
Neutrophils Relative %: 49 % (ref 43.0–77.0)
Platelets: 243 10*3/uL (ref 150.0–400.0)
RBC: 4.43 Mil/uL (ref 3.87–5.11)
RDW: 13.4 % (ref 11.5–15.5)
WBC: 4.9 10*3/uL (ref 4.0–10.5)

## 2022-06-28 LAB — LIPID PANEL
Cholesterol: 220 mg/dL — ABNORMAL HIGH (ref 0–200)
HDL: 81.4 mg/dL (ref >39.00)
LDL Cholesterol: 123 mg/dL — ABNORMAL HIGH (ref 0–99)
NonHDL: 138.48
Total CHOL/HDL Ratio: 3
Triglycerides: 77 mg/dL (ref 0.0–149.0)
VLDL: 15.4 mg/dL (ref 0.0–40.0)

## 2022-06-28 LAB — COMPREHENSIVE METABOLIC PANEL
ALT: 24 U/L (ref 0–35)
AST: 21 U/L (ref 0–37)
Albumin: 4.2 g/dL (ref 3.5–5.2)
Alkaline Phosphatase: 55 U/L (ref 39–117)
BUN: 21 mg/dL (ref 6–23)
CO2: 27 mEq/L (ref 19–32)
Calcium: 9.3 mg/dL (ref 8.4–10.5)
Chloride: 105 mEq/L (ref 96–112)
Creatinine, Ser: 0.9 mg/dL (ref 0.40–1.20)
GFR: 67.72 mL/min (ref >60.00)
Glucose, Bld: 100 mg/dL — ABNORMAL HIGH (ref 70–99)
Potassium: 4.7 mEq/L (ref 3.5–5.1)
Sodium: 142 mEq/L (ref 135–145)
Total Bilirubin: 0.7 mg/dL (ref 0.2–1.2)
Total Protein: 6.1 g/dL (ref 6.0–8.3)

## 2022-07-06 ENCOUNTER — Other Ambulatory Visit: Payer: Self-pay

## 2022-07-06 ENCOUNTER — Other Ambulatory Visit: Payer: Self-pay | Admitting: Family Medicine

## 2022-07-06 DIAGNOSIS — E785 Hyperlipidemia, unspecified: Secondary | ICD-10-CM

## 2022-07-06 MED ORDER — ATORVASTATIN CALCIUM 40 MG PO TABS: 40.0000 mg | ORAL_TABLET | Freq: Every day | ORAL | 2 refills | Status: DC

## 2022-07-18 ENCOUNTER — Other Ambulatory Visit: Payer: Self-pay | Admitting: Family Medicine

## 2022-07-18 DIAGNOSIS — E785 Hyperlipidemia, unspecified: Secondary | ICD-10-CM

## 2022-07-18 DIAGNOSIS — I10 Essential (primary) hypertension: Secondary | ICD-10-CM

## 2022-07-18 DIAGNOSIS — E782 Mixed hyperlipidemia: Secondary | ICD-10-CM

## 2022-07-28 ENCOUNTER — Other Ambulatory Visit: Payer: Self-pay | Admitting: Family Medicine

## 2022-07-28 DIAGNOSIS — I10 Essential (primary) hypertension: Secondary | ICD-10-CM

## 2022-07-28 DIAGNOSIS — E785 Hyperlipidemia, unspecified: Secondary | ICD-10-CM

## 2022-07-28 DIAGNOSIS — E782 Mixed hyperlipidemia: Secondary | ICD-10-CM

## 2022-07-29 ENCOUNTER — Encounter: Payer: Self-pay | Admitting: Family Medicine

## 2022-07-29 ENCOUNTER — Ambulatory Visit (INDEPENDENT_AMBULATORY_CARE_PROVIDER_SITE_OTHER): Payer: BC Managed Care – PPO

## 2022-07-29 DIAGNOSIS — Z23 Encounter for immunization: Secondary | ICD-10-CM | POA: Diagnosis not present

## 2022-07-29 NOTE — Progress Notes (Signed)
Pt here for influenza Shot  Pt tolerated well left deltoid

## 2022-10-06 ENCOUNTER — Other Ambulatory Visit: Payer: Self-pay | Admitting: Family Medicine

## 2022-12-07 ENCOUNTER — Telehealth: Payer: BC Managed Care – PPO | Admitting: Physician Assistant

## 2022-12-07 ENCOUNTER — Telehealth: Payer: BC Managed Care – PPO | Admitting: Nurse Practitioner

## 2022-12-07 DIAGNOSIS — U071 COVID-19: Secondary | ICD-10-CM

## 2022-12-07 MED ORDER — NIRMATRELVIR/RITONAVIR (PAXLOVID)TABLET: 3.0000 | ORAL_TABLET | Freq: Two times a day (BID) | ORAL | 0 refills | Status: AC

## 2022-12-07 MED ORDER — COVID-19 AT HOME ANTIGEN TEST VI KIT: PACK | 1 refills | Status: DC

## 2022-12-07 MED ORDER — BENZONATATE 100 MG PO CAPS: 100.0000 mg | ORAL_CAPSULE | Freq: Three times a day (TID) | ORAL | 0 refills | Status: DC | PRN

## 2022-12-07 NOTE — Progress Notes (Signed)
   Thank you for the details you included in the comment boxes. Those details are very helpful in determining the best course of treatment for you and help [name] to provide the best care.Because your are COVID positive and have risks for more complicated illness where antivirals should be discussed (we cannot prescribe via e-visit), we recommend that you convert this visit to a video visit in order for one of the providers to better assess what is going on.  The provider will be able to give you a more accurate diagnosis and treatment plan if we can more freely discuss your symptoms and with the addition of a virtual examination.   If you convert to a video visit, we will bill your insurance (similar to an office visit) and you will not be charged for this e-Visit. You will be able to stay at home and speak with the first available [facility] advanced practice provider. The link to do a video visit is in the drop down Menu tab of your Welcome screen in [place].

## 2022-12-07 NOTE — Progress Notes (Signed)
Virtual Visit Consent   Kitiara Hintze, you are scheduled for a virtual visit with a [facility] provider today. Just as with appointments in the office, your consent must be obtained to participate. Your consent will be active for this visit and any virtual visit you may have with one of our providers in the next 365 days. If you have a MyChart account, a copy of this consent can be sent to you electronically.  As this is a virtual visit, video technology does not allow for your provider to perform a traditional examination. This may limit your provider's ability to fully assess your condition. If your provider identifies any concerns that need to be evaluated in person or the need to arrange testing (such as labs, EKG, etc.), we will make arrangements to do so. Although advances in technology are sophisticated, we cannot ensure that it will always work on either your end or our end. If the connection with a video visit is poor, the visit may have to be switched to a telephone visit. With either a video or telephone visit, we are not always able to ensure that we have a secure connection.  By engaging in this virtual visit, you consent to the provision of healthcare and authorize for your insurance to be billed (if applicable) for the services provided during this visit. Depending on your insurance coverage, you may receive a charge related to this service.  I need to obtain your verbal consent now. Are you willing to proceed with your visit today? Kristen Underwood has provided verbal consent on 12/07/2022 for a virtual visit (video or telephone). Apolonio Schneiders, FNP  Date: 12/07/2022 3:30 PM  Virtual Visit via Video Note   I, Apolonio Schneiders, connected with  Kristen Underwood  ([id number], 1956/03/05) on 12/07/22 at  3:30 PM EST by a video-enabled telemedicine application and verified that I am speaking with the correct person using two identifiers.  Location: Patient: Virtual Visit Location Patient:  Home Provider: Virtual Visit Location Provider: Home Office   I discussed the limitations of evaluation and management by telemedicine and the availability of in person appointments. The patient expressed understanding and agreed to proceed.    History of Present Illness: Kristen Underwood is a 67 y.o. who identifies as a female who was assigned female at birth, and is being seen today after testing positive for COVID.  She recently returned from [facility]  Her husband also has symptoms and tested positive today   Her symptoms just started today Her symptoms include low grade fever   She has not had COVID in the past She has been vaccinated in the past for COVID including the most recent booster   She did have an acute kidney incident in 2019 due to lack of hydration. She has since been diligent about water intake and has had a return of normal kidney function over the past 3 years.   She has also lost 60lbs since the time of her kidney incident   Problems:  Patient Active Problem List   Diagnosis Date Noted   Suprapubic pain 12/17/2020   Dehydration 12/18/2017   Stage 2 chronic kidney disease 12/04/2017   Prediabetes 12/04/2017   Shortness of breath on exertion 11/20/2017   Other fatigue 11/20/2017   Vitamin D deficiency 11/20/2017   Hyperglycemia 11/20/2017   History of pneumonia 08/15/2017   Chest pain 06/22/2017   HTN (hypertension) 09/13/2015   Essential and other specified forms of tremor 10/09/2013   Intention tremor 10/04/2013  Cough, persistent 10/04/2013   SINUSITIS - ACUTE-NOS 08/02/2010   Essential hypertension 07/22/2010   HEADACHE 07/22/2010   Hyperlipidemia 08/13/2008   ANXIETY STATE, UNSPECIFIED 08/13/2008   Migraine headache 08/13/2008   BACK PAIN 08/13/2008   FATIGUE 08/13/2008   ELEVATED BP READING WITHOUT DX HYPERTENSION 08/13/2008    Allergies: No Known Allergies Medications:  Current Outpatient Medications:    amLODipine (NORVASC) 2.5 MG tablet, Take 1  tablet (2.5 mg total) by mouth daily., Disp: 90 tablet, Rfl: 1   aspirin 81 MG tablet, Take 81 mg by mouth daily., Disp: , Rfl:    atorvastatin (LIPITOR) 20 MG tablet, Take 1 tablet (20 mg total) by mouth daily., Disp: 90 tablet, Rfl: 1   atorvastatin (LIPITOR) 40 MG tablet, TAKE 1 TABLET BY MOUTH EVERY DAY, Disp: 90 tablet, Rfl: 0   cholecalciferol (VITAMIN D) 1000 UNITS tablet, Take 1,000 Units by mouth daily., Disp: , Rfl:    Coenzyme Q10 (COQ10) 400 MG CAPS, Take 1 tablet by mouth daily., Disp: 30 capsule, Rfl: 0   dicyclomine (BENTYL) 10 MG capsule, TAKE 1 CAPSULE BY MOUTH 4 TIMES DAILY - BEFORE MEALS AND AT BEDTIME., Disp: 90 capsule, Rfl: 0   omeprazole (PRILOSEC) 20 MG capsule, Take 20 mg by mouth daily., Disp: , Rfl:    Pyridoxine HCl (VITAMIN B-6) 500 MG tablet, Take 500 mg by mouth daily., Disp: , Rfl:   Observations/Objective: Patient is well-developed, well-nourished in no acute distress.  Resting comfortably  at home.  Head is normocephalic, atraumatic.  No labored breathing.  Speech is clear and coherent with logical content.  Patient is alert and oriented at baseline.    Assessment and Plan: 1. COVID-19  - nirmatrelvir/ritonavir (PAXLOVID) 20 x 150 MG & 10 x '100MG'$  TABS; Take 3 tablets by mouth 2 (two) times daily for 5 days. (Take nirmatrelvir 150 mg two tablets twice daily for 5 days and ritonavir 100 mg one tablet twice daily for 5 days) Patient GFR is 67  Dispense: 30 tablet; Refill: 0 - benzonatate (TESSALON) 100 MG capsule; Take 1 capsule (100 mg total) by mouth 3 (three) times daily as needed.  Dispense: 30 capsule; Refill: 0     Follow Up Instructions: I discussed the assessment and treatment plan with the patient. The patient was provided an opportunity to ask questions and all were answered. The patient agreed with the plan and demonstrated an understanding of the instructions.  A copy of instructions were sent to the patient via MyChart unless otherwise noted  below.    The patient was advised to call back or seek an in-person evaluation if the symptoms worsen or if the condition fails to improve as anticipated.  Time:  I spent 15 minutes with the patient via telehealth technology discussing the above problems/concerns.    Apolonio Schneiders, FNP

## 2022-12-19 ENCOUNTER — Other Ambulatory Visit: Payer: Self-pay | Admitting: Family Medicine

## 2022-12-19 DIAGNOSIS — I10 Essential (primary) hypertension: Secondary | ICD-10-CM

## 2022-12-19 DIAGNOSIS — G43809 Other migraine, not intractable, without status migrainosus: Secondary | ICD-10-CM

## 2023-01-02 ENCOUNTER — Other Ambulatory Visit: Payer: Self-pay | Admitting: Family Medicine

## 2023-02-06 ENCOUNTER — Other Ambulatory Visit: Payer: Self-pay | Admitting: Family Medicine

## 2023-02-10 ENCOUNTER — Other Ambulatory Visit: Payer: Self-pay | Admitting: Family Medicine

## 2023-02-24 ENCOUNTER — Other Ambulatory Visit: Payer: Self-pay | Admitting: Family Medicine

## 2023-03-05 ENCOUNTER — Other Ambulatory Visit: Payer: Self-pay | Admitting: Family Medicine

## 2023-03-05 DIAGNOSIS — I10 Essential (primary) hypertension: Secondary | ICD-10-CM

## 2023-03-05 DIAGNOSIS — G43809 Other migraine, not intractable, without status migrainosus: Secondary | ICD-10-CM

## 2023-03-28 ENCOUNTER — Other Ambulatory Visit: Payer: Self-pay | Admitting: Family Medicine

## 2023-03-28 DIAGNOSIS — G43809 Other migraine, not intractable, without status migrainosus: Secondary | ICD-10-CM

## 2023-03-28 DIAGNOSIS — I10 Essential (primary) hypertension: Secondary | ICD-10-CM

## 2023-04-01 ENCOUNTER — Other Ambulatory Visit: Payer: Self-pay | Admitting: Family Medicine

## 2023-04-01 DIAGNOSIS — G43809 Other migraine, not intractable, without status migrainosus: Secondary | ICD-10-CM

## 2023-04-01 DIAGNOSIS — I10 Essential (primary) hypertension: Secondary | ICD-10-CM

## 2023-05-24 ENCOUNTER — Other Ambulatory Visit: Payer: Self-pay | Admitting: Family Medicine

## 2023-05-24 DIAGNOSIS — I10 Essential (primary) hypertension: Secondary | ICD-10-CM

## 2023-05-24 DIAGNOSIS — G43809 Other migraine, not intractable, without status migrainosus: Secondary | ICD-10-CM

## 2023-07-17 ENCOUNTER — Other Ambulatory Visit: Payer: Self-pay | Admitting: Family Medicine

## 2023-07-17 DIAGNOSIS — Z1231 Encounter for screening mammogram for malignant neoplasm of breast: Secondary | ICD-10-CM

## 2023-07-19 NOTE — Progress Notes (Signed)
Erroneous

## 2023-07-24 DIAGNOSIS — M25561 Pain in right knee: Secondary | ICD-10-CM | POA: Diagnosis not present

## 2023-07-24 DIAGNOSIS — M79671 Pain in right foot: Secondary | ICD-10-CM | POA: Diagnosis not present

## 2023-07-28 ENCOUNTER — Ambulatory Visit
Admission: RE | Admit: 2023-07-28 | Discharge: 2023-07-28 | Disposition: A | Discharge status: Home / Self Care | Payer: Medicare Other | Source: Ambulatory Visit

## 2023-07-28 DIAGNOSIS — Z1231 Encounter for screening mammogram for malignant neoplasm of breast: Secondary | ICD-10-CM

## 2023-08-29 DIAGNOSIS — S83281A Other tear of lateral meniscus, current injury, right knee, initial encounter: Secondary | ICD-10-CM | POA: Diagnosis not present

## 2023-09-26 DIAGNOSIS — M25561 Pain in right knee: Secondary | ICD-10-CM | POA: Diagnosis not present

## 2023-09-28 DIAGNOSIS — S83241A Other tear of medial meniscus, current injury, right knee, initial encounter: Secondary | ICD-10-CM | POA: Diagnosis not present

## 2023-09-29 DIAGNOSIS — L570 Actinic keratosis: Secondary | ICD-10-CM | POA: Diagnosis not present

## 2023-09-29 DIAGNOSIS — L814 Other melanin hyperpigmentation: Secondary | ICD-10-CM | POA: Diagnosis not present

## 2023-09-29 DIAGNOSIS — D225 Melanocytic nevi of trunk: Secondary | ICD-10-CM | POA: Diagnosis not present

## 2023-09-29 DIAGNOSIS — D485 Neoplasm of uncertain behavior of skin: Secondary | ICD-10-CM | POA: Diagnosis not present

## 2023-09-29 DIAGNOSIS — L821 Other seborrheic keratosis: Secondary | ICD-10-CM | POA: Diagnosis not present

## 2023-10-02 DIAGNOSIS — L820 Inflamed seborrheic keratosis: Secondary | ICD-10-CM | POA: Diagnosis not present

## 2023-10-02 DIAGNOSIS — C4442 Squamous cell carcinoma of skin of scalp and neck: Secondary | ICD-10-CM | POA: Diagnosis not present

## 2023-10-17 DIAGNOSIS — L718 Other rosacea: Secondary | ICD-10-CM | POA: Diagnosis not present

## 2023-10-17 DIAGNOSIS — L570 Actinic keratosis: Secondary | ICD-10-CM | POA: Diagnosis not present

## 2023-10-24 DIAGNOSIS — C4442 Squamous cell carcinoma of skin of scalp and neck: Secondary | ICD-10-CM | POA: Diagnosis not present

## 2023-11-09 DIAGNOSIS — S83241D Other tear of medial meniscus, current injury, right knee, subsequent encounter: Secondary | ICD-10-CM | POA: Diagnosis not present

## 2023-12-27 DIAGNOSIS — Z85828 Personal history of other malignant neoplasm of skin: Secondary | ICD-10-CM | POA: Diagnosis not present

## 2023-12-27 DIAGNOSIS — L2989 Other pruritus: Secondary | ICD-10-CM | POA: Diagnosis not present

## 2023-12-27 DIAGNOSIS — Z08 Encounter for follow-up examination after completed treatment for malignant neoplasm: Secondary | ICD-10-CM | POA: Diagnosis not present

## 2023-12-27 DIAGNOSIS — L905 Scar conditions and fibrosis of skin: Secondary | ICD-10-CM | POA: Diagnosis not present

## 2023-12-27 DIAGNOSIS — Z872 Personal history of diseases of the skin and subcutaneous tissue: Secondary | ICD-10-CM | POA: Diagnosis not present

## 2024-03-06 DIAGNOSIS — S83241D Other tear of medial meniscus, current injury, right knee, subsequent encounter: Secondary | ICD-10-CM | POA: Diagnosis not present

## 2024-03-26 DIAGNOSIS — Z135 Encounter for screening for eye and ear disorders: Secondary | ICD-10-CM | POA: Diagnosis not present

## 2024-03-26 DIAGNOSIS — Z961 Presence of intraocular lens: Secondary | ICD-10-CM | POA: Diagnosis not present

## 2024-04-09 DIAGNOSIS — Z872 Personal history of diseases of the skin and subcutaneous tissue: Secondary | ICD-10-CM | POA: Diagnosis not present

## 2024-04-09 DIAGNOSIS — L821 Other seborrheic keratosis: Secondary | ICD-10-CM | POA: Diagnosis not present

## 2024-04-09 DIAGNOSIS — L905 Scar conditions and fibrosis of skin: Secondary | ICD-10-CM | POA: Diagnosis not present

## 2024-04-12 DIAGNOSIS — H26491 Other secondary cataract, right eye: Secondary | ICD-10-CM | POA: Diagnosis not present

## 2024-04-12 DIAGNOSIS — H04123 Dry eye syndrome of bilateral lacrimal glands: Secondary | ICD-10-CM | POA: Diagnosis not present

## 2024-04-12 DIAGNOSIS — H26493 Other secondary cataract, bilateral: Secondary | ICD-10-CM | POA: Diagnosis not present

## 2024-05-06 DIAGNOSIS — H26492 Other secondary cataract, left eye: Secondary | ICD-10-CM | POA: Diagnosis not present

## 2024-06-03 ENCOUNTER — Encounter (INDEPENDENT_AMBULATORY_CARE_PROVIDER_SITE_OTHER): Payer: Self-pay

## 2024-06-24 ENCOUNTER — Encounter: Payer: Self-pay | Admitting: Family Medicine

## 2024-07-12 ENCOUNTER — Encounter: Payer: Self-pay | Admitting: Family Medicine

## 2024-07-12 ENCOUNTER — Ambulatory Visit (INDEPENDENT_AMBULATORY_CARE_PROVIDER_SITE_OTHER): Admitting: Family Medicine

## 2024-07-12 VITALS — BP 116/72 | HR 72 | Temp 97.8°F | Resp 16 | Ht 62.0 in | Wt 139.8 lb

## 2024-07-12 DIAGNOSIS — E785 Hyperlipidemia, unspecified: Secondary | ICD-10-CM

## 2024-07-12 DIAGNOSIS — I10 Essential (primary) hypertension: Secondary | ICD-10-CM | POA: Diagnosis not present

## 2024-07-12 DIAGNOSIS — Z23 Encounter for immunization: Secondary | ICD-10-CM | POA: Diagnosis not present

## 2024-07-12 LAB — CBC WITH DIFFERENTIAL/PLATELET
Basophils Absolute: 0.1 K/uL (ref 0.0–0.1)
Basophils Relative: 1 % (ref 0.0–3.0)
Eosinophils Absolute: 0.2 K/uL (ref 0.0–0.7)
Eosinophils Relative: 4 % (ref 0.0–5.0)
HCT: 43.9 % (ref 36.0–46.0)
Hemoglobin: 14.7 g/dL (ref 12.0–15.0)
Lymphocytes Relative: 20.1 % (ref 12.0–46.0)
Lymphs Abs: 1.1 K/uL (ref 0.7–4.0)
MCHC: 33.5 g/dL (ref 30.0–36.0)
MCV: 91.4 fl (ref 78.0–100.0)
Monocytes Absolute: 0.3 K/uL (ref 0.1–1.0)
Monocytes Relative: 5.6 % (ref 3.0–12.0)
Neutro Abs: 3.8 K/uL (ref 1.4–7.7)
Neutrophils Relative %: 69.3 % (ref 43.0–77.0)
Platelets: 264 K/uL (ref 150.0–400.0)
RBC: 4.8 Mil/uL (ref 3.87–5.11)
RDW: 13.7 % (ref 11.5–15.5)
WBC: 5.5 K/uL (ref 4.0–10.5)

## 2024-07-12 LAB — LIPID PANEL
Cholesterol: 356 mg/dL — ABNORMAL HIGH (ref 0–200)
HDL: 84.3 mg/dL (ref >39.00)
LDL Cholesterol: 252 mg/dL — ABNORMAL HIGH (ref 0–99)
NonHDL: 271.28
Total CHOL/HDL Ratio: 4
Triglycerides: 95 mg/dL (ref 0.0–149.0)
VLDL: 19 mg/dL (ref 0.0–40.0)

## 2024-07-12 LAB — COMPREHENSIVE METABOLIC PANEL WITH GFR
ALT: 20 U/L (ref 0–35)
AST: 20 U/L (ref 0–37)
Albumin: 4.5 g/dL (ref 3.5–5.2)
Alkaline Phosphatase: 58 U/L (ref 39–117)
BUN: 24 mg/dL — ABNORMAL HIGH (ref 6–23)
CO2: 27 meq/L (ref 19–32)
Calcium: 9.8 mg/dL (ref 8.4–10.5)
Chloride: 103 meq/L (ref 96–112)
Creatinine, Ser: 0.94 mg/dL (ref 0.40–1.20)
GFR: 63.37 mL/min (ref >60.00)
Glucose, Bld: 96 mg/dL (ref 70–99)
Potassium: 4.7 meq/L (ref 3.5–5.1)
Sodium: 141 meq/L (ref 135–145)
Total Bilirubin: 0.5 mg/dL (ref 0.2–1.2)
Total Protein: 6.7 g/dL (ref 6.0–8.3)

## 2024-07-12 LAB — TSH: TSH: 1.62 u[IU]/mL (ref 0.35–5.50)

## 2024-07-12 NOTE — Assessment & Plan Note (Signed)
 Off bp meds

## 2024-07-12 NOTE — Progress Notes (Signed)
 Subjective:    Patient ID: Kristen Underwood, female    DOB: 02/03/1956, 68 y.o.   MRN: [id number]  Chief Complaint  Patient presents with   Annual Exam    Pt states fasting     HPI Patient is in today for f/u bp and cholesterol.   Discussed the use of AI scribe software for clinical note transcription with the patient, who gave verbal consent to proceed.  History of Present Illness Kristen Underwood is a 68 year old female who presents for a follow-up visit to discuss medication management and health maintenance.  She has not been taking her medications for about a year and is considering getting blood tests to determine if she should resume them. She reports working out five times a week at [employer].  She is concerned about getting colds, which significantly affect her lungs, leading to prolonged symptoms lasting up to a year. She is cautious about exposure, especially in crowded settings, and is considering wearing a mask to prevent illness.  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 No significant swelling in the ankles, only occasional mild swelling.   Past Medical History:  Diagnosis Date   Back pain    BRCA negative    Cough, persistent    Elevated cholesterol    GERD (gastroesophageal reflux disease)    Hyperlipidemia    Hypertension    10/09/13:  pt denies   Migraine    is the reason she takes BP med   Neck pain     Past Surgical History:  Procedure Laterality Date   ABDOMINAL HYSTERECTOMY     APPENDECTOMY     CATARACT EXTRACTION Left 03/26/2020   Dr AUSTIN   COLONOSCOPY  2008   [place] -Normal exam   TONSILLECTOMY AND ADENOIDECTOMY  1966    Family History  Problem Relation Age of  Onset   Diabetes Mother    Hyperlipidemia Mother    Stroke Mother    Depression Mother    Obesity Mother    Breast cancer Mother 57   Cancer Father        PROSTATE   Hypertension Father    Mental illness Sister        had urosepsis--  and had schizo breakdown   Breast cancer Maternal Aunt        in 70's   Breast cancer Maternal Grandmother        in 50's   Breast cancer Maternal Aunt        in 70's   Breast cancer Maternal Aunt        in 70's   Breast cancer Maternal Aunt        in 70's   Colon cancer Neg Hx    Colon polyps Neg Hx    Esophageal cancer Neg Hx    Rectal cancer Neg Hx    Stomach cancer Neg Hx     Social History   Socioeconomic History   Marital status: Married    Spouse name: Education officer, community   Number of children: 3   Years of education: Not on file   Highest education level: Not on file  Occupational History   Occupation: Administrator, sports: [employer]  Tobacco Use   Smoking status: Never   Smokeless tobacco: Never  Vaping Use   Vaping status: Never Used  Substance and Sexual Activity   Alcohol use: Yes    Alcohol/week: 1.0 standard drink of alcohol    Types: 1 Glasses of wine per week    Comment: RARE,SOCIAL   Drug use: No   Sexual activity: Yes    Partners: Male  Other Topics Concern   Not on file  Social History Narrative   Exercise--not lately   Social Drivers of Corporate investment banker Strain: Not on file  Food Insecurity: Not on file  Transportation Needs: Not on file  Physical Activity: Not on file  Stress: Not on file  Social Connections: Not on file  Intimate Partner Violence: Not on file    Outpatient Medications Prior to Visit  Medication Sig Dispense Refill   cholecalciferol (VITAMIN D ) 1000 UNITS tablet Take 1,000 Units by mouth daily.     Coenzyme Q10 (COQ10) 400 MG CAPS Take 1 tablet by mouth daily. 30 capsule 0   omeprazole (PRILOSEC) 20 MG capsule Take 20 mg by mouth daily.     Pyridoxine HCl  (VITAMIN B-6) 500 MG tablet Take 500 mg by mouth daily.     amLODipine  (NORVASC ) 2.5 MG tablet Take 1 tablet (2.5 mg total) by mouth daily. 30 tablet 0   aspirin 81 MG tablet Take 81 mg by mouth daily.     atorvastatin  (LIPITOR) 40 MG tablet TAKE 1 TABLET BY MOUTH DAILY. PT NEEDS OFFICE VISIT FOR FURTHER REFILLS. 90 tablet 1   benzonatate  (TESSALON ) 100 MG capsule Take 1 capsule (100 mg total) by mouth 3 (three) times daily as needed. 30 capsule 0   COVID-19 At Home Antigen Test (BINAXNOW COVID-19 AG HOME TEST) KIT Use as directed. 1 kit 1   dicyclomine  (BENTYL ) 10 MG capsule TAKE 1 CAPSULE BY MOUTH 4 TIMES DAILY - BEFORE MEALS AND AT BEDTIME. 90 capsule 0   No facility-administered medications prior to visit.    No Known Allergies  Review of Systems  Constitutional:  Negative for fever and malaise/fatigue.  HENT:  Negative for congestion.   Eyes:  Negative for blurred vision.  Respiratory:  Negative for shortness of breath.   Cardiovascular:  Negative for chest pain, palpitations and leg swelling.  Gastrointestinal:  Negative for abdominal pain, blood in stool and nausea.  Genitourinary:  Negative for dysuria and frequency.  Musculoskeletal:  Negative for falls.  Skin:  Negative for rash.  Neurological:  Negative for dizziness, loss of consciousness and headaches.  Endo/Heme/Allergies:  Negative for environmental allergies.  Psychiatric/Behavioral:  Negative for depression. The patient is not nervous/anxious.        Objective:    Physical Exam Vitals and nursing note reviewed.  Constitutional:      General: She is not in acute distress.    Appearance: Normal appearance. She is well-developed.  HENT:     Head: Normocephalic and atraumatic.  Eyes:     General: No scleral icterus.       Right eye: No discharge.        Left eye: No discharge.  Cardiovascular:     Rate and Rhythm: Normal rate and regular rhythm.     Heart sounds: No murmur heard. Pulmonary:     Effort:  Pulmonary effort is normal. No respiratory distress.     Breath sounds: Normal breath sounds.  Musculoskeletal:        General: No swelling.  Normal range of motion.     Cervical back: Normal range of motion and neck supple.     Right lower leg: No edema.     Left lower leg: No edema.  Skin:    General: Skin is warm and dry.  Neurological:     Mental Status: She is alert and oriented to person, place, and time.  Psychiatric:        Mood and Affect: Mood normal.        Behavior: Behavior normal.        Thought Content: Thought content normal.        Judgment: Judgment normal.     BP 116/72 (BP Location: Left Arm, Patient Position: Sitting)   Pulse 72   Temp 97.8 F (36.6 C) (Oral)   Resp 16   Ht 5' 2 (1.575 m)   Wt 139 lb 12.8 oz (63.4 kg)   SpO2 98%   BMI 25.57 kg/m  Wt Readings from Last 3 Encounters:  07/12/24 139 lb 12.8 oz (63.4 kg)  03/25/21 129 lb (58.5 kg)  03/05/21 129 lb (58.5 kg)    Diabetic Foot Exam - Simple   No data filed    Lab Results  Component Value Date   WBC 4.9 06/28/2022   HGB 13.8 06/28/2022   HCT 41.0 06/28/2022   PLT 243.0 06/28/2022   GLUCOSE 100 (H) 06/28/2022   CHOL 220 (H) 06/28/2022   TRIG 77.0 06/28/2022   HDL 81.40 06/28/2022   LDLDIRECT 151.8 07/22/2010   LDLCALC 123 (H) 06/28/2022   ALT 24 06/28/2022   AST 21 06/28/2022   NA 142 06/28/2022   K 4.7 06/28/2022   CL 105 06/28/2022   CREATININE 0.90 06/28/2022   BUN 21 06/28/2022   CO2 27 06/28/2022   TSH 2.01 03/31/2020   HGBA1C 5.5 10/09/2018    Lab Results  Component Value Date   TSH 2.01 03/31/2020   Lab Results  Component Value Date   WBC 4.9 06/28/2022   HGB 13.8 06/28/2022   HCT 41.0 06/28/2022   MCV 92.5 06/28/2022   PLT 243.0 06/28/2022   Lab Results  Component Value Date   NA 142 06/28/2022   K 4.7 06/28/2022   CO2 27 06/28/2022   GLUCOSE 100 (H) 06/28/2022   BUN 21 06/28/2022   CREATININE 0.90 06/28/2022   BILITOT 0.7 06/28/2022   ALKPHOS 55  06/28/2022   AST 21 06/28/2022   ALT 24 06/28/2022   PROT 6.1 06/28/2022   ALBUMIN 4.2 06/28/2022   CALCIUM  9.3 06/28/2022   ANIONGAP 9 12/09/2020   GFR 67.72 06/28/2022   Lab Results  Component Value Date   CHOL 220 (H) 06/28/2022   Lab Results  Component Value Date   HDL 81.40 06/28/2022   Lab Results  Component Value Date   LDLCALC 123 (H) 06/28/2022   Lab Results  Component Value Date   TRIG 77.0 06/28/2022   Lab Results  Component Value Date   CHOLHDL 3 06/28/2022   Lab Results  Component Value Date   HGBA1C 5.5 10/09/2018       Assessment & Plan:  Need for influenza vaccination -     Flu vaccine HIGH DOSE PF(Fluzone Trivalent)  Essential (primary) hypertension -     CBC with Differential/Platelet -     Comprehensive metabolic panel with GFR -     Lipid panel -     TSH  Hyperlipidemia, unspecified hyperlipidemia type Assessment & Plan: Encourage heart healthy diet  such as MIND or DASH diet, increase exercise, avoid trans fats, simple carbohydrates and processed foods, consider a krill or fish or flaxseed oil cap daily.    Orders: -     CBC with Differential/Platelet -     Comprehensive metabolic panel with GFR -     Lipid panel  Essential hypertension Assessment & Plan: Off bp meds    Assessment and Plan Assessment & Plan Hypertension Hypertension is well-controlled. She has not used antihypertensive medication for about a year. Order blood tests to evaluate the need for resuming medication.    Kristen Thedford R Lowne Chase, DO

## 2024-07-12 NOTE — Assessment & Plan Note (Signed)
 Encourage heart healthy diet such as MIND or DASH diet, increase exercise, avoid trans fats, simple carbohydrates and processed foods, consider a krill or fish or flaxseed oil cap daily.

## 2024-07-14 ENCOUNTER — Ambulatory Visit: Payer: Self-pay | Admitting: Family Medicine

## 2024-07-14 ENCOUNTER — Other Ambulatory Visit: Payer: Self-pay | Admitting: Family Medicine

## 2024-07-14 DIAGNOSIS — E785 Hyperlipidemia, unspecified: Secondary | ICD-10-CM

## 2024-07-15 ENCOUNTER — Other Ambulatory Visit: Payer: Self-pay | Admitting: Family Medicine

## 2024-07-15 MED ORDER — ATORVASTATIN CALCIUM 20 MG PO TABS: 20.0000 mg | ORAL_TABLET | Freq: Every day | ORAL | 3 refills | Status: DC

## 2024-07-17 ENCOUNTER — Encounter

## 2024-07-17 DIAGNOSIS — Z1231 Encounter for screening mammogram for malignant neoplasm of breast: Secondary | ICD-10-CM

## 2024-07-30 ENCOUNTER — Encounter

## 2024-07-30 ENCOUNTER — Ambulatory Visit

## 2024-07-30 ENCOUNTER — Other Ambulatory Visit: Payer: Self-pay | Admitting: Family Medicine

## 2024-07-30 ENCOUNTER — Ambulatory Visit
Admission: RE | Admit: 2024-07-30 | Discharge: 2024-07-30 | Disposition: A | Discharge status: Home / Self Care | Source: Ambulatory Visit | Attending: Family Medicine | Admitting: Family Medicine

## 2024-07-30 DIAGNOSIS — Z1231 Encounter for screening mammogram for malignant neoplasm of breast: Secondary | ICD-10-CM | POA: Diagnosis not present

## 2024-09-19 ENCOUNTER — Ambulatory Visit: Admitting: Family Medicine

## 2024-09-19 ENCOUNTER — Encounter: Payer: Self-pay | Admitting: Family Medicine

## 2024-09-19 VITALS — BP 110/70 | HR 54 | Temp 97.9°F | Resp 18 | Ht 62.0 in | Wt 135.6 lb

## 2024-09-19 DIAGNOSIS — I10 Essential (primary) hypertension: Secondary | ICD-10-CM | POA: Diagnosis not present

## 2024-09-19 DIAGNOSIS — E785 Hyperlipidemia, unspecified: Secondary | ICD-10-CM | POA: Diagnosis not present

## 2024-09-19 DIAGNOSIS — Z23 Encounter for immunization: Secondary | ICD-10-CM

## 2024-09-19 LAB — COMPREHENSIVE METABOLIC PANEL WITH GFR
ALT: 26 U/L (ref 0–35)
AST: 22 U/L (ref 0–37)
Albumin: 4.6 g/dL (ref 3.5–5.2)
Alkaline Phosphatase: 57 U/L (ref 39–117)
BUN: 19 mg/dL (ref 6–23)
CO2: 28 meq/L (ref 19–32)
Calcium: 9.9 mg/dL (ref 8.4–10.5)
Chloride: 107 meq/L (ref 96–112)
Creatinine, Ser: 0.98 mg/dL (ref 0.40–1.20)
GFR: 60.2 mL/min (ref >60.00)
Glucose, Bld: 89 mg/dL (ref 70–99)
Potassium: 4.5 meq/L (ref 3.5–5.1)
Sodium: 143 meq/L (ref 135–145)
Total Bilirubin: 0.7 mg/dL (ref 0.2–1.2)
Total Protein: 6.6 g/dL (ref 6.0–8.3)

## 2024-09-19 LAB — LIPID PANEL
Cholesterol: 226 mg/dL — ABNORMAL HIGH (ref 0–200)
HDL: 90.3 mg/dL (ref >39.00)
LDL Cholesterol: 123 mg/dL — ABNORMAL HIGH (ref 0–99)
NonHDL: 135.36
Total CHOL/HDL Ratio: 2
Triglycerides: 60 mg/dL (ref 0.0–149.0)
VLDL: 12 mg/dL (ref 0.0–40.0)

## 2024-09-19 NOTE — Assessment & Plan Note (Signed)
 Well controlled, no changes to meds. Encouraged heart healthy diet such as the DASH diet and exercise as tolerated.

## 2024-09-19 NOTE — Progress Notes (Signed)
 Subjective:    Patient ID: Kristen Underwood, female    DOB: [birth year], 68 y.o.   MRN: [id number]  Chief Complaint  Patient presents with   Hyperlipidemia   Follow-up    HPI Patient is in today for f/u cholesterol.  Discussed the use of AI scribe software for clinical note transcription with the patient, who gave verbal consent to proceed.  History of Present Illness Kristen Underwood is a 68 year old female with hyperlipidemia who presents for follow-up on her cholesterol levels.  She has been on cholesterol medication for approximately 15 years. There was a brief discontinuation period when she lost weight and was exercising regularly, during which her cholesterol levels increased significantly from 200 to nearly 400 mg/dL, with a noted level of 356 mg/dL.  She exercises four to five times a week and is mindful of her diet. She is currently taking Lipitor, though the dose was not specified.  In her family history, her maternal grandfather had a heart attack at a young age, but her parents and siblings have not had similar issues. Her sister has mental health concerns but is otherwise healthy.  She has received her flu and COVID vaccinations, both administered on October 5th, with the COVID vaccine being the Pfizer brand.      Past Medical History:  Diagnosis Date   Back pain    BRCA negative    Cough, persistent    Elevated cholesterol    GERD (gastroesophageal reflux disease)    Hyperlipidemia    Hypertension    10/09/13:  pt denies   Migraine    is the reason she takes BP med   Neck pain     Past Surgical History:  Procedure Laterality Date   ABDOMINAL HYSTERECTOMY     APPENDECTOMY     CATARACT EXTRACTION Left 03/26/2020   Dr AUSTIN   COLONOSCOPY  2008   [place] -Normal exam   TONSILLECTOMY AND ADENOIDECTOMY  1966    Family History  Problem Relation Age of Onset   Diabetes Mother    Hyperlipidemia Mother    Stroke Mother    Depression Mother    Obesity Mother     Breast cancer Mother 66   Cancer Father        PROSTATE   Hypertension Father    Mental illness Sister        had urosepsis--  and had schizo breakdown   Breast cancer Maternal Grandmother        in 50's   Heart attack Maternal Grandfather    Breast cancer Maternal Aunt        in 70's   Breast cancer Maternal Aunt        in 70's   Breast cancer Maternal Aunt        in 70's   Breast cancer Maternal Aunt        in 70's   Colon cancer Neg Hx    Colon polyps Neg Hx    Esophageal cancer Neg Hx    Rectal cancer Neg Hx    Stomach cancer Neg Hx     Social History   Socioeconomic History   Marital status: Married    Spouse name: Education Officer, Community   Number of children: 3   Years of education: Not on file   Highest education level: Not on file  Occupational History   Occupation: PSYCHOLOGIST    Employer: [employer]  Tobacco Use   Smoking status: Never   Smokeless  tobacco: Never  Vaping Use   Vaping status: Never Used  Substance and Sexual Activity   Alcohol use: Yes    Alcohol/week: 1.0 standard drink of alcohol    Types: 1 Glasses of wine per week    Comment: RARE,SOCIAL   Drug use: No   Sexual activity: Yes    Partners: Male  Other Topics Concern   Not on file  Social History Narrative   Exercise--not lately   Social Drivers of Corporate Investment Banker Strain: Not on file  Food Insecurity: Not on file  Transportation Needs: Not on file  Physical Activity: Not on file  Stress: Not on file  Social Connections: Not on file  Intimate Partner Violence: Not on file    Outpatient Medications Prior to Visit  Medication Sig Dispense Refill   atorvastatin  (LIPITOR) 20 MG tablet Take 1 tablet (20 mg total) by mouth daily. 90 tablet 3   cholecalciferol (VITAMIN D ) 1000 UNITS tablet Take 1,000 Units by mouth daily.     Coenzyme Q10 (COQ10) 400 MG CAPS Take 1 tablet by mouth daily. 30 capsule 0   omeprazole (PRILOSEC) 20 MG capsule Take 20 mg by mouth daily.      Pyridoxine HCl (VITAMIN B-6) 500 MG tablet Take 500 mg by mouth daily.     No facility-administered medications prior to visit.    No Known Allergies  Review of Systems  Constitutional:  Negative for fever and malaise/fatigue.  HENT:  Negative for congestion.   Eyes:  Negative for blurred vision.  Respiratory:  Negative for shortness of breath.   Cardiovascular:  Negative for chest pain, palpitations and leg swelling.  Gastrointestinal:  Negative for abdominal pain, blood in stool and nausea.  Genitourinary:  Negative for dysuria and frequency.  Musculoskeletal:  Negative for falls.  Skin:  Negative for rash.  Neurological:  Negative for dizziness, loss of consciousness and headaches.  Endo/Heme/Allergies:  Negative for environmental allergies.  Psychiatric/Behavioral:  Negative for depression. The patient is not nervous/anxious.        Objective:    Physical Exam Vitals and nursing note reviewed.  Constitutional:      General: She is not in acute distress.    Appearance: Normal appearance. She is well-developed.  HENT:     Head: Normocephalic and atraumatic.     Right Ear: Tympanic membrane, ear canal and external ear normal. There is no impacted cerumen.     Left Ear: Tympanic membrane, ear canal and external ear normal. There is no impacted cerumen.     Nose: Nose normal.     Mouth/Throat:     Mouth: Mucous membranes are moist.     Pharynx: Oropharynx is clear. No oropharyngeal exudate or posterior oropharyngeal erythema.  Eyes:     General: No scleral icterus.       Right eye: No discharge.        Left eye: No discharge.     Conjunctiva/sclera: Conjunctivae normal.     Pupils: Pupils are equal, round, and reactive to light.  Neck:     Thyroid : No thyromegaly or thyroid  tenderness.     Vascular: No JVD.  Cardiovascular:     Rate and Rhythm: Normal rate and regular rhythm.     Heart sounds: Normal heart sounds. No murmur heard. Pulmonary:     Effort:  Pulmonary effort is normal. No respiratory distress.     Breath sounds: Normal breath sounds.  Abdominal:     General: Bowel sounds are  normal. There is no distension.     Palpations: Abdomen is soft. There is no mass.     Tenderness: There is no abdominal tenderness. There is no guarding or rebound.  Musculoskeletal:        General: Normal range of motion.     Cervical back: Normal range of motion and neck supple.     Right lower leg: No edema.     Left lower leg: No edema.  Lymphadenopathy:     Cervical: No cervical adenopathy.  Skin:    General: Skin is warm and dry.     Findings: No erythema or rash.  Neurological:     Mental Status: She is alert and oriented to person, place, and time.     Cranial Nerves: No cranial nerve deficit.     Deep Tendon Reflexes: Reflexes are normal and symmetric.  Psychiatric:        Mood and Affect: Mood normal.        Behavior: Behavior normal.        Thought Content: Thought content normal.        Judgment: Judgment normal.     BP 110/70 (BP Location: Right Arm, Patient Position: Sitting, Cuff Size: Normal)   Pulse (!) 54   Temp 97.9 F (36.6 C) (Oral)   Resp 18   Ht 5' 2 (1.575 m)   Wt 135 lb 9.6 oz (61.5 kg)   SpO2 99%   BMI 24.80 kg/m  Wt Readings from Last 3 Encounters:  09/19/24 135 lb 9.6 oz (61.5 kg)  07/12/24 139 lb 12.8 oz (63.4 kg)  03/25/21 129 lb (58.5 kg)    Diabetic Foot Exam - Simple   No data filed    Lab Results  Component Value Date   WBC 5.5 07/12/2024   HGB 14.7 07/12/2024   HCT 43.9 07/12/2024   PLT 264.0 07/12/2024   GLUCOSE 96 07/12/2024   CHOL 356 (H) 07/12/2024   TRIG 95.0 07/12/2024   HDL 84.30 07/12/2024   LDLDIRECT 151.8 07/22/2010   LDLCALC 252 (H) 07/12/2024   ALT 20 07/12/2024   AST 20 07/12/2024   NA 141 07/12/2024   K 4.7 07/12/2024   CL 103 07/12/2024   CREATININE 0.94 07/12/2024   BUN 24 (H) 07/12/2024   CO2 27 07/12/2024   TSH 1.62 07/12/2024   HGBA1C 5.5 10/09/2018    Lab  Results  Component Value Date   TSH 1.62 07/12/2024   Lab Results  Component Value Date   WBC 5.5 07/12/2024   HGB 14.7 07/12/2024   HCT 43.9 07/12/2024   MCV 91.4 07/12/2024   PLT 264.0 07/12/2024   Lab Results  Component Value Date   NA 141 07/12/2024   K 4.7 07/12/2024   CO2 27 07/12/2024   GLUCOSE 96 07/12/2024   BUN 24 (H) 07/12/2024   CREATININE 0.94 07/12/2024   BILITOT 0.5 07/12/2024   ALKPHOS 58 07/12/2024   AST 20 07/12/2024   ALT 20 07/12/2024   PROT 6.7 07/12/2024   ALBUMIN 4.5 07/12/2024   CALCIUM  9.8 07/12/2024   ANIONGAP 9 12/09/2020   GFR 63.37 07/12/2024   Lab Results  Component Value Date   CHOL 356 (H) 07/12/2024   Lab Results  Component Value Date   HDL 84.30 07/12/2024   Lab Results  Component Value Date   LDLCALC 252 (H) 07/12/2024   Lab Results  Component Value Date   TRIG 95.0 07/12/2024   Lab Results  Component Value Date  CHOLHDL 4 07/12/2024   Lab Results  Component Value Date   HGBA1C 5.5 10/09/2018       Assessment & Plan:  Hyperlipidemia, unspecified hyperlipidemia type Assessment & Plan: Tolerating statin, encouraged heart healthy diet, avoid trans fats, minimize simple carbs and saturated fats. Increase exercise as tolerated   Orders: -     CT CARDIAC SCORING (SELF PAY ONLY); Future -     Comprehensive metabolic panel with GFR -     Lipid panel -     Lipoprotein A (LPA)  Essential hypertension Assessment & Plan: Well controlled, no changes to meds. Encouraged heart healthy diet such as the DASH diet and exercise as tolerated.    Orders: -     CT CARDIAC SCORING (SELF PAY ONLY); Future  Need for pneumococcal 20-valent conjugate vaccination -     Pneumococcal conjugate vaccine 20-valent  Assessment and Plan Assessment & Plan Hyperlipidemia   Chronic hyperlipidemia with a genetic component persists despite exercise and dietary modifications. Previous cholesterol levels were elevated at 356 mg/dL. She is  currently on Lipitor. Family history includes heart disease, with her grandfather having a heart attack at a similar age. A calcium  score test was discussed to assess cardiovascular risk, though it is not covered by insurance. If cholesterol levels remain high, medication adjustment will be considered. A cholesterol test and genetic test for cholesterol were ordered today, and a calcium  score test is scheduled for January. Continue Lipitor, with potential dosage adjustment based on future cholesterol levels.  General Health Maintenance   She is due for tetanus and pneumonia vaccinations. The pneumonia vaccine was administered today, and she was advised to obtain the tetanus vaccine at a pharmacy. Recent flu and COVID vaccinations were completed at a pharmacy.    Sania Noy R Lowne Chase, DO

## 2024-09-19 NOTE — Assessment & Plan Note (Signed)
 Tolerating statin, encouraged heart healthy diet, avoid trans fats, minimize simple carbs and saturated fats. Increase exercise as tolerated

## 2024-09-19 NOTE — Patient Instructions (Signed)
 Cholesterol Content in Foods Cholesterol is a waxy, fat-like substance that helps to carry fat in the blood. The body needs cholesterol in small amounts, but too much cholesterol can cause damage to the arteries and heart. What foods have cholesterol?  Cholesterol is found in animal-based foods, such as meat, seafood, and dairy. Generally, low-fat dairy and lean meats have less cholesterol than full-fat dairy and fatty meats. The milligrams of cholesterol per serving (mg per serving) of common cholesterol-containing foods are listed below. Meats and other proteins Egg -- one large whole egg has 186 mg. Veal shank -- 4 oz (113 g) has 141 mg. Lean ground [place] (93% lean) -- 4 oz (113 g) has 118 mg. Fat-trimmed lamb loin -- 4 oz (113 g) has 106 mg. Lean ground beef (90% lean) -- 4 oz (113 g) has 100 mg. Lobster -- 3.5 oz (99 g) has 90 mg. Pork loin chops -- 4 oz (113 g) has 86 mg. Canned salmon -- 3.5 oz (99 g) has 83 mg. Fat-trimmed beef top loin -- 4 oz (113 g) has 78 mg. Frankfurter -- 1 frank (3.5 oz or 99 g) has 77 mg. Crab -- 3.5 oz (99 g) has 71 mg. Roasted chicken without skin, white meat -- 4 oz (113 g) has 66 mg. Light bologna -- 2 oz (57 g) has 45 mg. Deli-cut [place] -- 2 oz (57 g) has 31 mg. Canned tuna -- 3.5 oz (99 g) has 31 mg. Tomasa Blase -- 1 oz (28 g) has 29 mg. Oysters and mussels (raw) -- 3.5 oz (99 g) has 25 mg. Mackerel -- 1 oz (28 g) has 22 mg. Trout -- 1 oz (28 g) has 20 mg. Pork sausage -- 1 link (1 oz or 28 g) has 17 mg. Salmon -- 1 oz (28 g) has 16 mg. Tilapia -- 1 oz (28 g) has 14 mg. Dairy Soft-serve ice cream --  cup (4 oz or 86 g) has 103 mg. Whole-milk yogurt -- 1 cup (8 oz or 245 g) has 29 mg. Cheddar cheese -- 1 oz (28 g) has 28 mg. American cheese -- 1 oz (28 g) has 28 mg. Whole milk -- 1 cup (8 oz or 250 mL) has 23 mg. 2% milk -- 1 cup (8 oz or 250 mL) has 18 mg. Cream cheese -- 1 tablespoon (Tbsp) (14.5 g) has 15 mg. Cottage cheese --  cup (4 oz or  113 g) has 14 mg. Low-fat (1%) milk -- 1 cup (8 oz or 250 mL) has 10 mg. Sour cream -- 1 Tbsp (12 g) has 8.5 mg. Low-fat yogurt -- 1 cup (8 oz or 245 g) has 8 mg. Nonfat Greek yogurt -- 1 cup (8 oz or 228 g) has 7 mg. Half-and-half cream -- 1 Tbsp (15 mL) has 5 mg. Fats and oils Cod liver oil -- 1 tablespoon (Tbsp) (13.6 g) has 82 mg. Butter -- 1 Tbsp (14 g) has 15 mg. Lard -- 1 Tbsp (12.8 g) has 14 mg. Bacon grease -- 1 Tbsp (12.9 g) has 14 mg. Mayonnaise -- 1 Tbsp (13.8 g) has 5-10 mg. Margarine -- 1 Tbsp (14 g) has 3-10 mg. The items listed above may not be a complete list of foods with cholesterol. Exact amounts of cholesterol in these foods may vary depending on specific ingredients and brands. Contact a dietitian for more information. What foods do not have cholesterol? Most plant-based foods do not have cholesterol unless you combine them with a food that has  cholesterol. Foods without cholesterol include: Grains and cereals. Vegetables. Fruits. Vegetable oils, such as olive, canola, and sunflower oil. Legumes, such as peas, beans, and lentils. Nuts and seeds. Egg whites. The items listed above may not be a complete list of foods that do not have cholesterol. Contact a dietitian for more information. Summary The body needs cholesterol in small amounts, but too much cholesterol can cause damage to the arteries and heart. Cholesterol is found in animal-based foods, such as meat, seafood, and dairy. Generally, low-fat dairy and lean meats have less cholesterol than full-fat dairy and fatty meats. This information is not intended to replace advice given to you by your health care provider. Make sure you discuss any questions you have with your health care provider. Document Revised: 03/05/2021 Document Reviewed: 03/05/2021 Elsevier Patient Education  2024 [employer].

## 2024-09-24 ENCOUNTER — Encounter: Payer: Self-pay | Admitting: Family Medicine

## 2024-09-25 LAB — LIPOPROTEIN A (LPA): Lipoprotein (a): 241 nmol/L — ABNORMAL HIGH (ref <75)

## 2024-09-26 ENCOUNTER — Other Ambulatory Visit: Payer: Self-pay | Admitting: Family Medicine

## 2024-09-26 ENCOUNTER — Ambulatory Visit: Payer: Self-pay | Admitting: Family Medicine

## 2024-09-26 DIAGNOSIS — I10 Essential (primary) hypertension: Secondary | ICD-10-CM

## 2024-09-26 DIAGNOSIS — E785 Hyperlipidemia, unspecified: Secondary | ICD-10-CM

## 2024-09-26 MED ORDER — ATORVASTATIN CALCIUM 40 MG PO TABS: 40.0000 mg | ORAL_TABLET | Freq: Every day | ORAL | 1 refills | Status: AC

## 2024-11-14 ENCOUNTER — Ambulatory Visit (HOSPITAL_BASED_OUTPATIENT_CLINIC_OR_DEPARTMENT_OTHER)
Admission: RE | Admit: 2024-11-14 | Discharge: 2024-11-14 | Disposition: A | Discharge status: Home / Self Care | Payer: Self-pay | Source: Ambulatory Visit | Attending: Family Medicine | Admitting: Family Medicine

## 2024-11-14 DIAGNOSIS — I10 Essential (primary) hypertension: Secondary | ICD-10-CM | POA: Insufficient documentation

## 2024-11-14 DIAGNOSIS — E785 Hyperlipidemia, unspecified: Secondary | ICD-10-CM | POA: Insufficient documentation
